# Patient Record
Sex: Female | Born: 1945 | Race: White | Hispanic: No | Smoking: Former smoker
Health system: Southern US, Community
[De-identification: ages and names within clinical notes are randomized; demographics above are authoritative.]

## PROBLEM LIST (undated history)

## (undated) DIAGNOSIS — I10 Essential (primary) hypertension: Secondary | ICD-10-CM

## (undated) DIAGNOSIS — F32A Depression, unspecified: Secondary | ICD-10-CM

## (undated) DIAGNOSIS — R053 Chronic cough: Secondary | ICD-10-CM

## (undated) DIAGNOSIS — E785 Hyperlipidemia, unspecified: Secondary | ICD-10-CM

## (undated) DIAGNOSIS — G629 Polyneuropathy, unspecified: Secondary | ICD-10-CM

## (undated) DIAGNOSIS — M810 Age-related osteoporosis without current pathological fracture: Secondary | ICD-10-CM

## (undated) DIAGNOSIS — N183 Chronic kidney disease, stage 3 unspecified: Secondary | ICD-10-CM

## (undated) DIAGNOSIS — I509 Heart failure, unspecified: Secondary | ICD-10-CM

## (undated) DIAGNOSIS — G8929 Other chronic pain: Secondary | ICD-10-CM

## (undated) DIAGNOSIS — F419 Anxiety disorder, unspecified: Secondary | ICD-10-CM

## (undated) DIAGNOSIS — M539 Dorsopathy, unspecified: Secondary | ICD-10-CM

## (undated) DIAGNOSIS — E119 Type 2 diabetes mellitus without complications: Secondary | ICD-10-CM

## (undated) DIAGNOSIS — K219 Gastro-esophageal reflux disease without esophagitis: Secondary | ICD-10-CM

## (undated) HISTORY — PX: ABDOMINAL HYSTERECTOMY: SHX81

## (undated) HISTORY — PX: BACK SURGERY: SHX140

## (undated) HISTORY — PX: ROTATOR CUFF REPAIR: SHX139

---

## 2000-11-18 ENCOUNTER — Encounter: Admission: RE | Admit: 2000-11-18 | Discharge: 2000-11-18 | Payer: Self-pay | Admitting: Family Medicine

## 2000-11-29 ENCOUNTER — Ambulatory Visit (HOSPITAL_BASED_OUTPATIENT_CLINIC_OR_DEPARTMENT_OTHER): Admission: RE | Admit: 2000-11-29 | Discharge: 2000-11-30 | Payer: Self-pay | Admitting: Orthopedic Surgery

## 2004-09-17 ENCOUNTER — Emergency Department (HOSPITAL_COMMUNITY): Admission: EM | Admit: 2004-09-17 | Discharge: 2004-09-18 | Payer: Self-pay | Admitting: Emergency Medicine

## 2004-09-17 ENCOUNTER — Emergency Department: Payer: Self-pay | Admitting: Internal Medicine

## 2004-09-17 IMAGING — CR LEFT WRIST - COMPLETE 3+ VIEW
1 series · 4 of 4 positions shown · non-contrast
Comparison: none

REASON FOR EXAM: Injury, pain
COMMENTS:

[Series 1: view not recorded · 0.17mm/px · 4 of 4 slices shown]
[im 1/4]
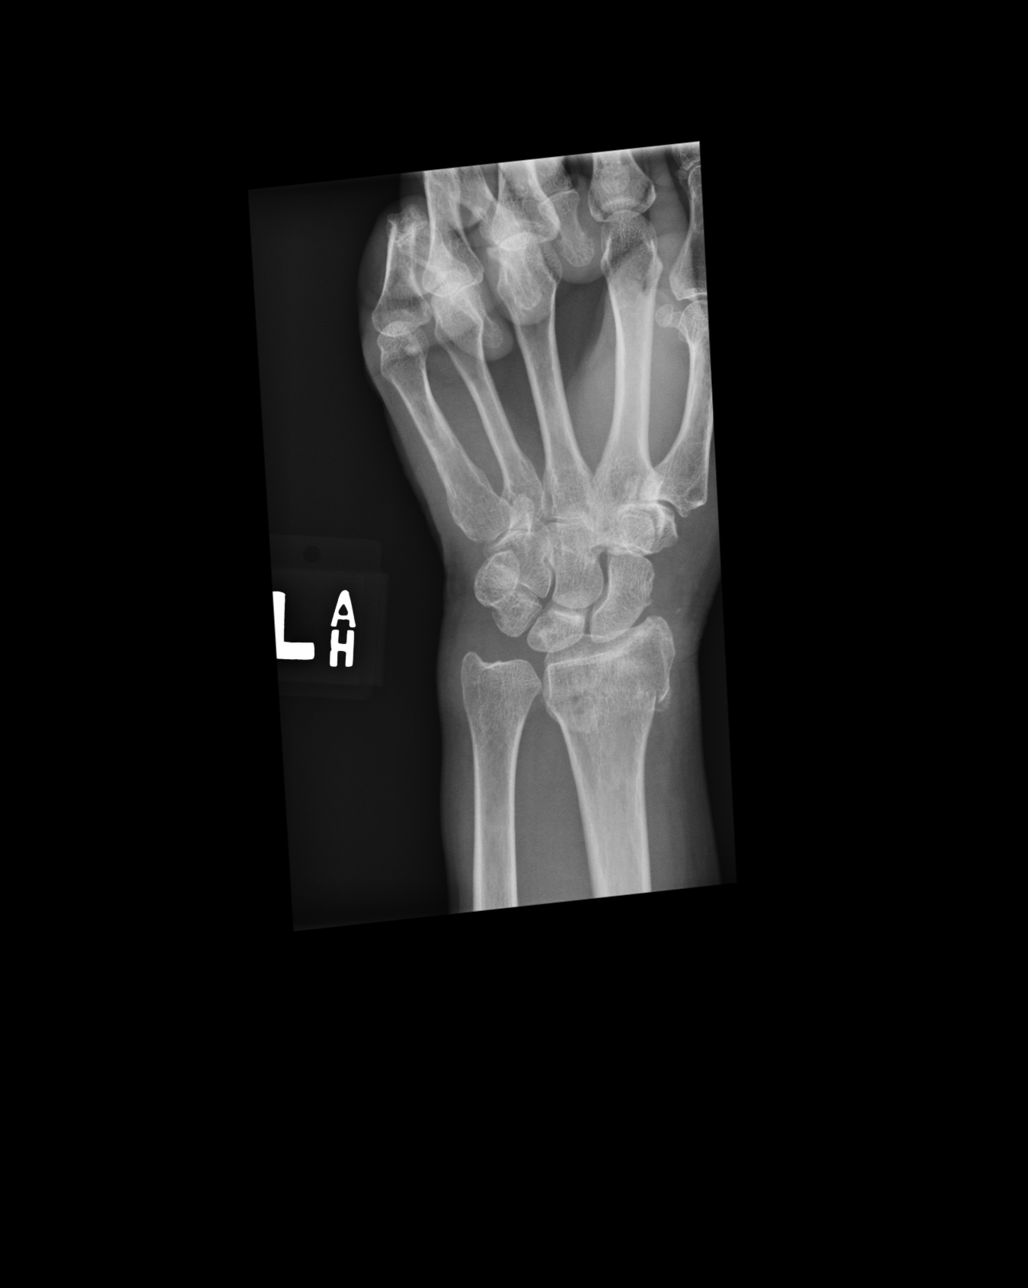
[im 2/4]
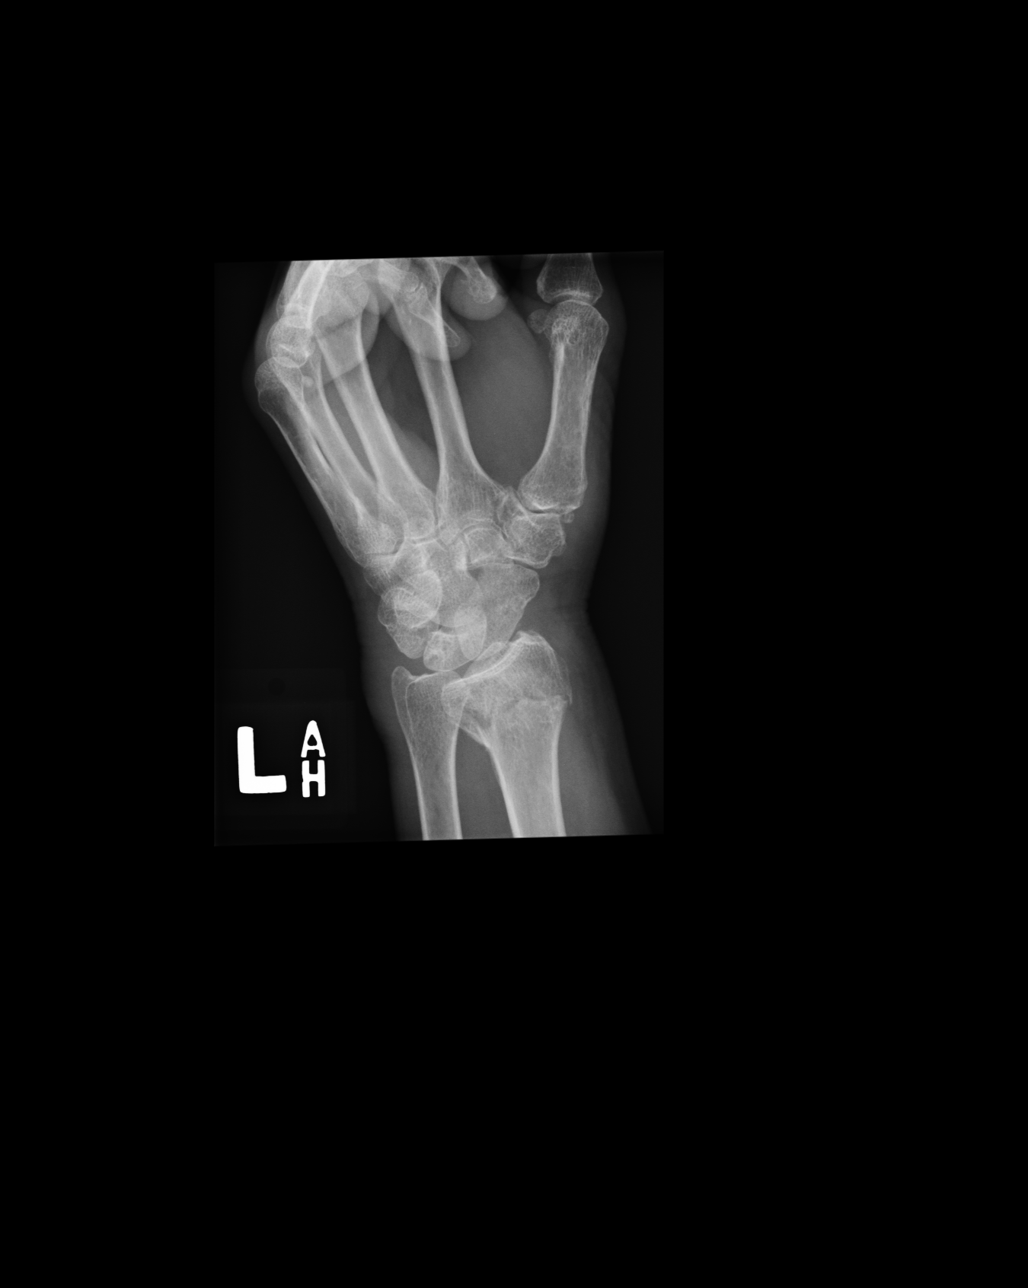
[im 3/4]
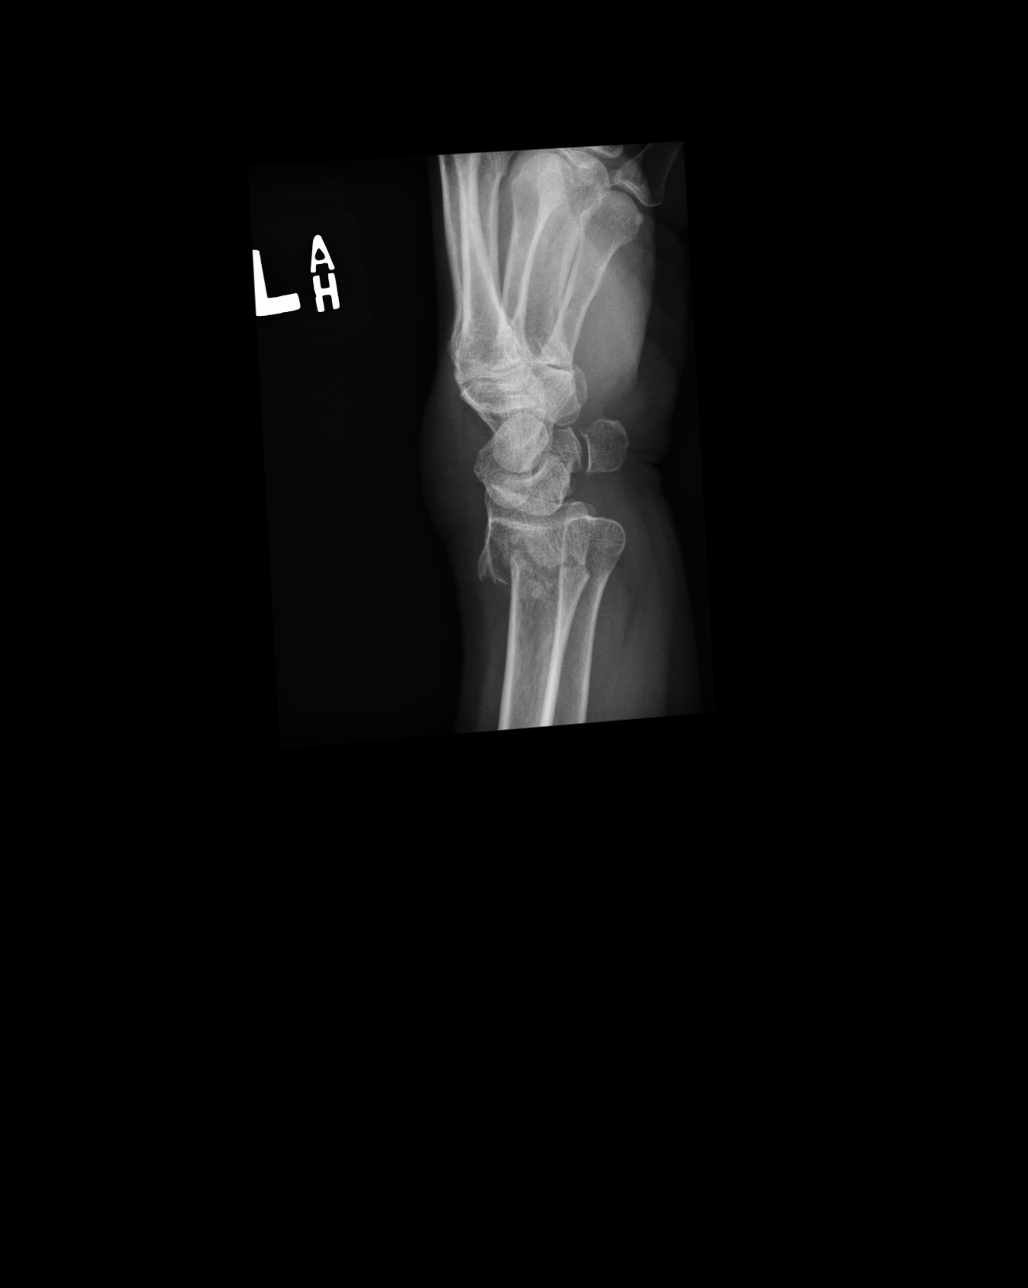
[im 4/4]
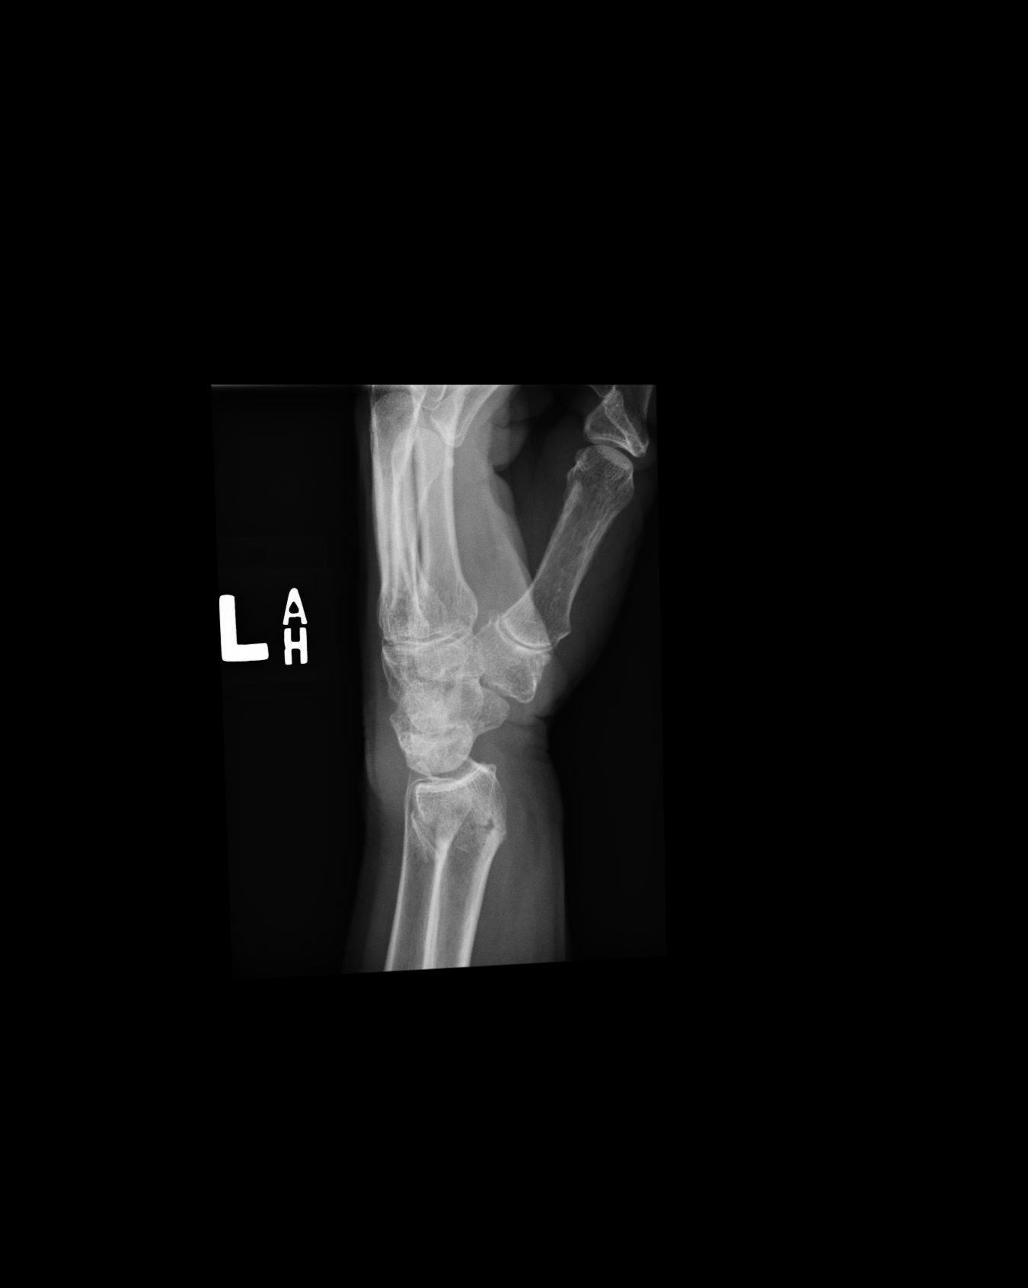

[4 of 4 positions shown; findings below may reference images not displayed]

PROCEDURE:     DXR - DXR WRIST LT COMP WITH OBLIQUES  - [DATE]  [DATE]

RESULT:     Multiple views of the LEFT wrist show a fracture in the distal
metaphysis of the LEFT radius with overlap of the fracture fragments by
to 4.0 mm. There remains anatomic alignment between the distal epiphysis of
the radius and the carpal bones. There is extensive soft tissue swelling. I
do not see evidence of an accompanying fracture of the ulna. Arthritic
changes are noted in the carpal bones particularly at the base of the thumb.
IMPRESSION: 1.     Comminuted fracture of the distal LEFT radius with 3.0 to 4.0 mm of
overlap.
2.     No perilunate disassociation.
3.     Extensive soft tissue swelling.
4.     No evidence of accompanying ulnar fracture.
5.     Arthritic changes within the carpal bones.

## 2004-09-18 IMAGING — CR DG FOREARM 2V*L*
2 series · 2 of 2 positions shown · non-contrast
Comparison: none

CLINICAL DATA: Status post closed reduction of distal radius fracture.
 LEFT FOREARM ? 2 VIEW:
 A transverse fracture of the distal radial metaphysis is seen which is in near anatomic alignment.  A fiberglass cast has been applied.  The cast obscures fine bone detail however, no other fracture is identified.

[view not recorded (1 of 2)]
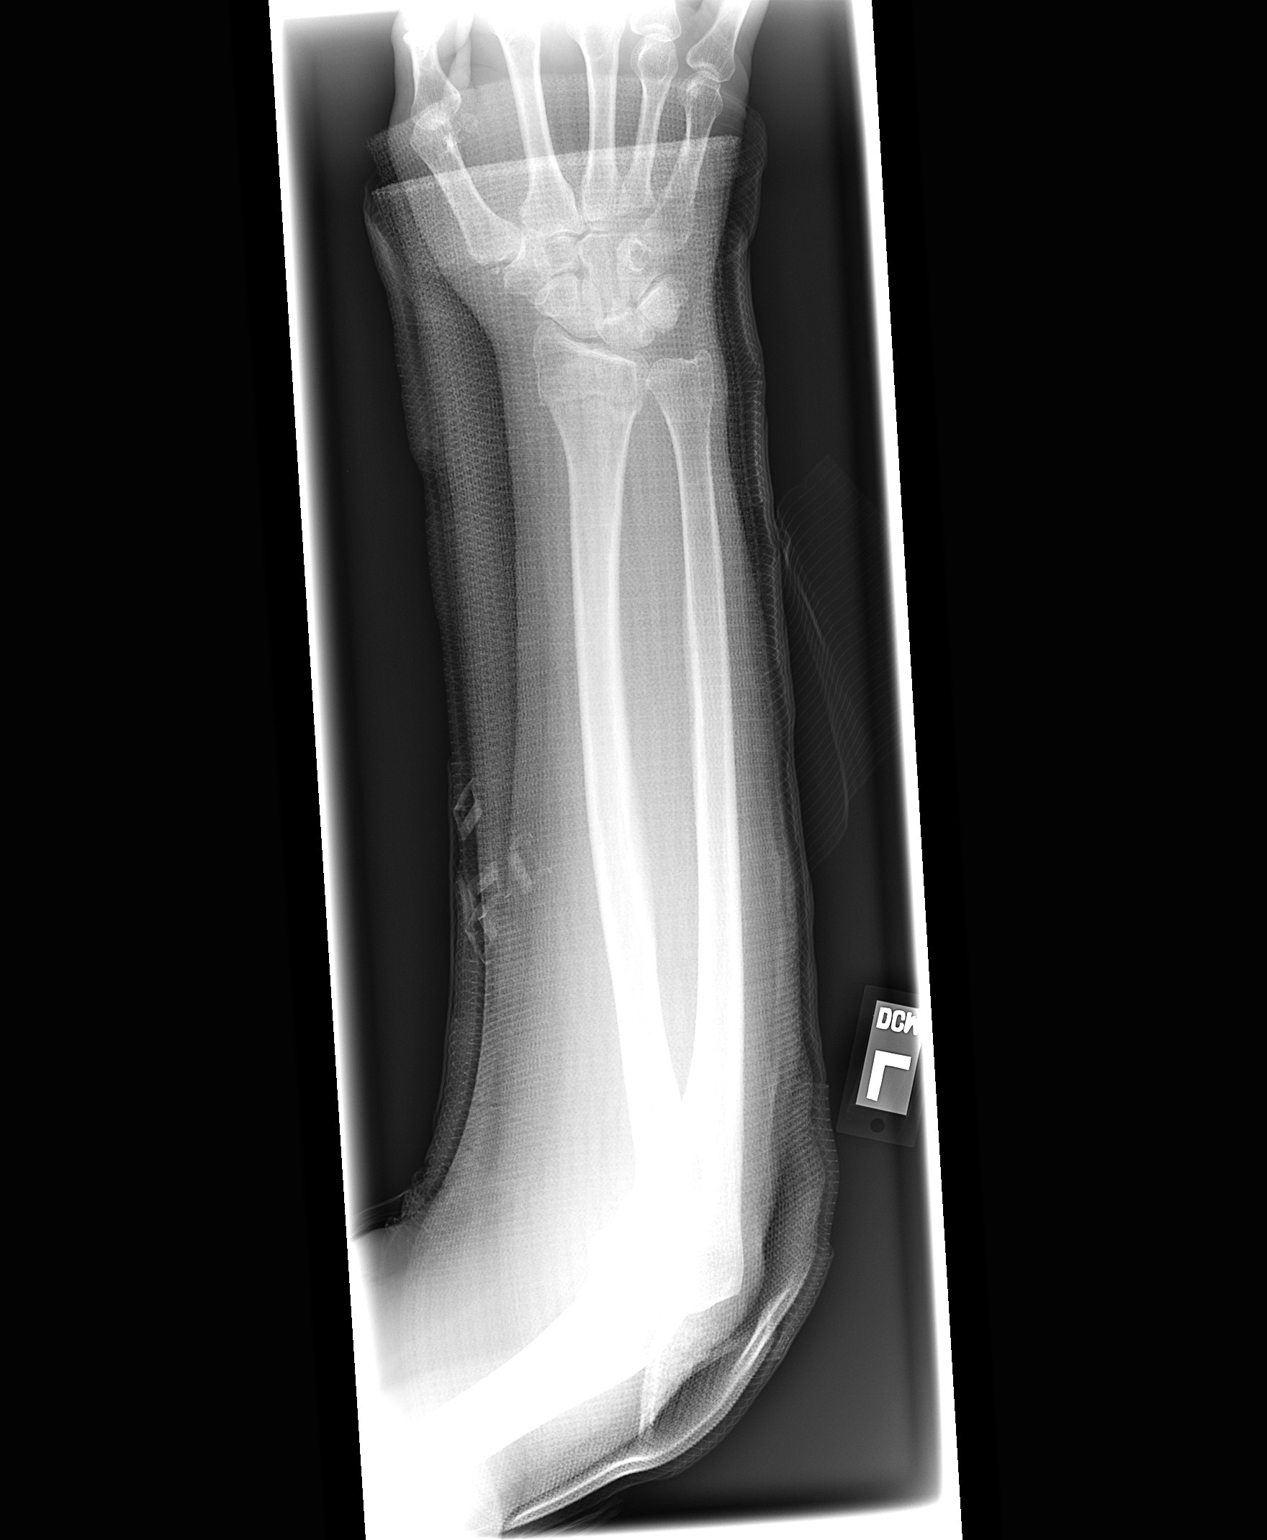

[view not recorded (2 of 2)]
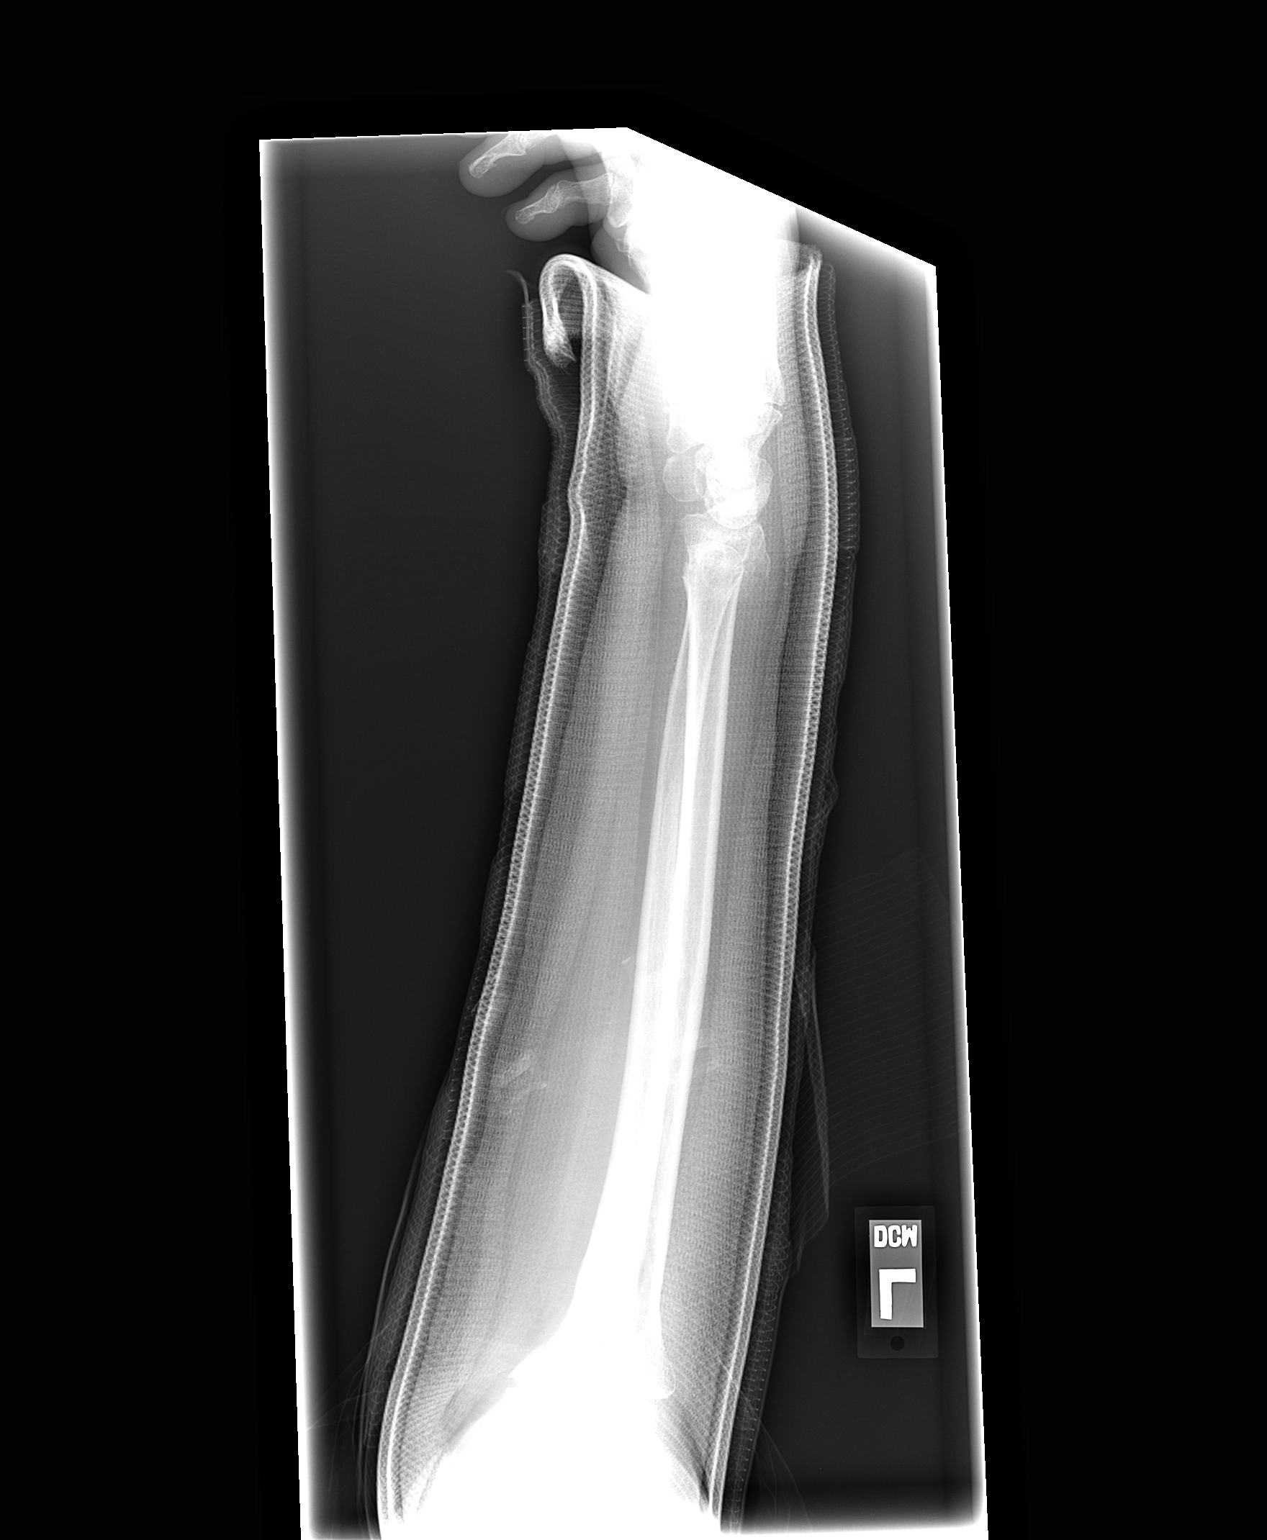

[2 of 2 positions shown; findings below may reference images not displayed]

IMPRESSION: Transverse fracture of the distal radial metaphysis, now in near anatomic alignment.

## 2005-02-28 ENCOUNTER — Ambulatory Visit: Payer: Self-pay | Admitting: Specialist

## 2005-03-21 DIAGNOSIS — I219 Acute myocardial infarction, unspecified: Secondary | ICD-10-CM

## 2005-03-21 HISTORY — DX: Acute myocardial infarction, unspecified: I21.9

## 2005-03-21 HISTORY — PX: CARDIAC CATHETERIZATION: SHX172

## 2005-07-08 ENCOUNTER — Other Ambulatory Visit: Payer: Self-pay

## 2005-07-08 ENCOUNTER — Emergency Department: Payer: Self-pay | Admitting: Emergency Medicine

## 2005-07-08 IMAGING — CR DG CHEST 1V PORT
1 series · 1 of 1 positions shown · non-contrast
Comparison: none

REASON FOR EXAM: CHEST PAIN
COMMENTS:

PROCEDURE:     DXR - DXR PORTABLE CHEST SINGLE VIEW  - [DATE] [DATE]
RESULT:     AP view of the chest shows the lung fields to be clear.  The
heart, mediastinal, and osseous structures show no significant
abnormalities. Monitoring electrodes are present.

[view not recorded]
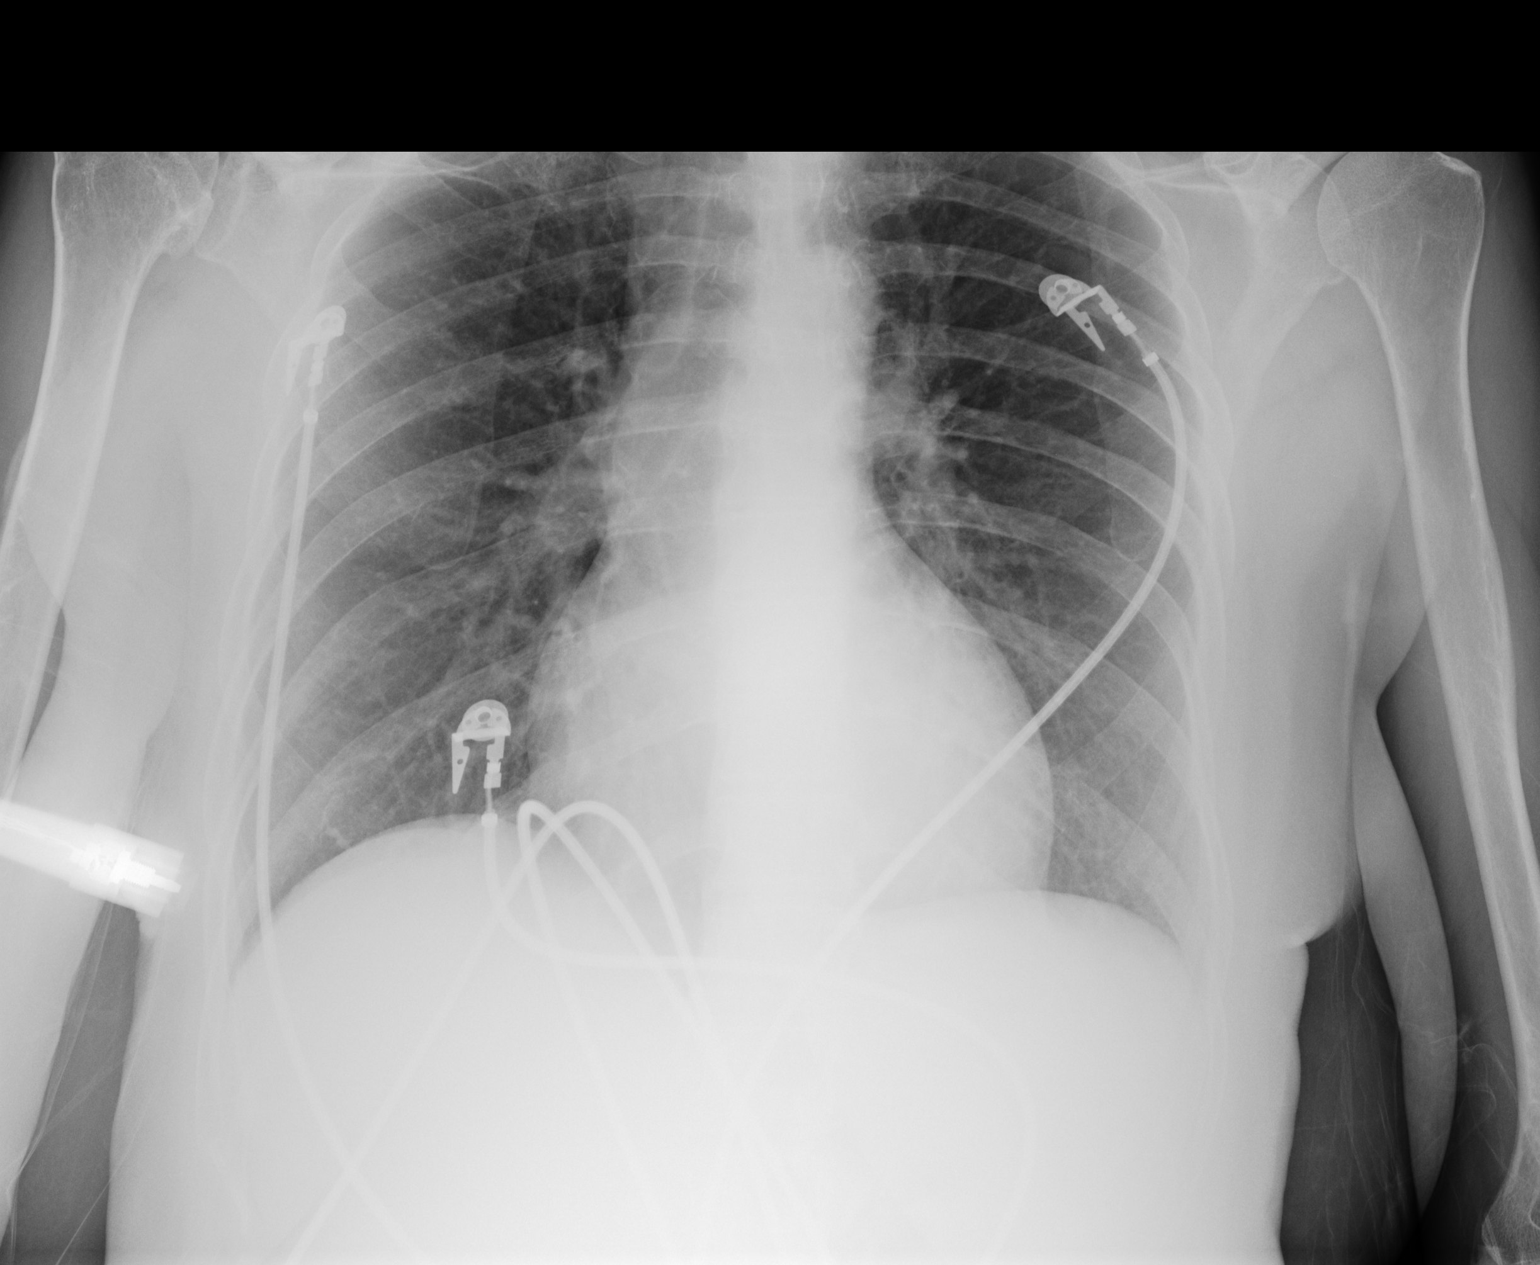

[1 of 1 positions shown; findings below may reference images not displayed]

IMPRESSION: No acute changes are identified.

## 2005-08-26 ENCOUNTER — Other Ambulatory Visit: Payer: Self-pay

## 2005-08-26 IMAGING — CR DG FOOT COMPLETE 3+V*L*
1 series · 3 of 3 positions shown · non-contrast
Comparison: none

REASON FOR EXAM: twisted this a.m., now with swelling
COMMENTS:  LMP: Post-Menopausal

[Series 1: view not recorded · 0.17mm/px · 3 of 3 slices shown]
[im 1/3]
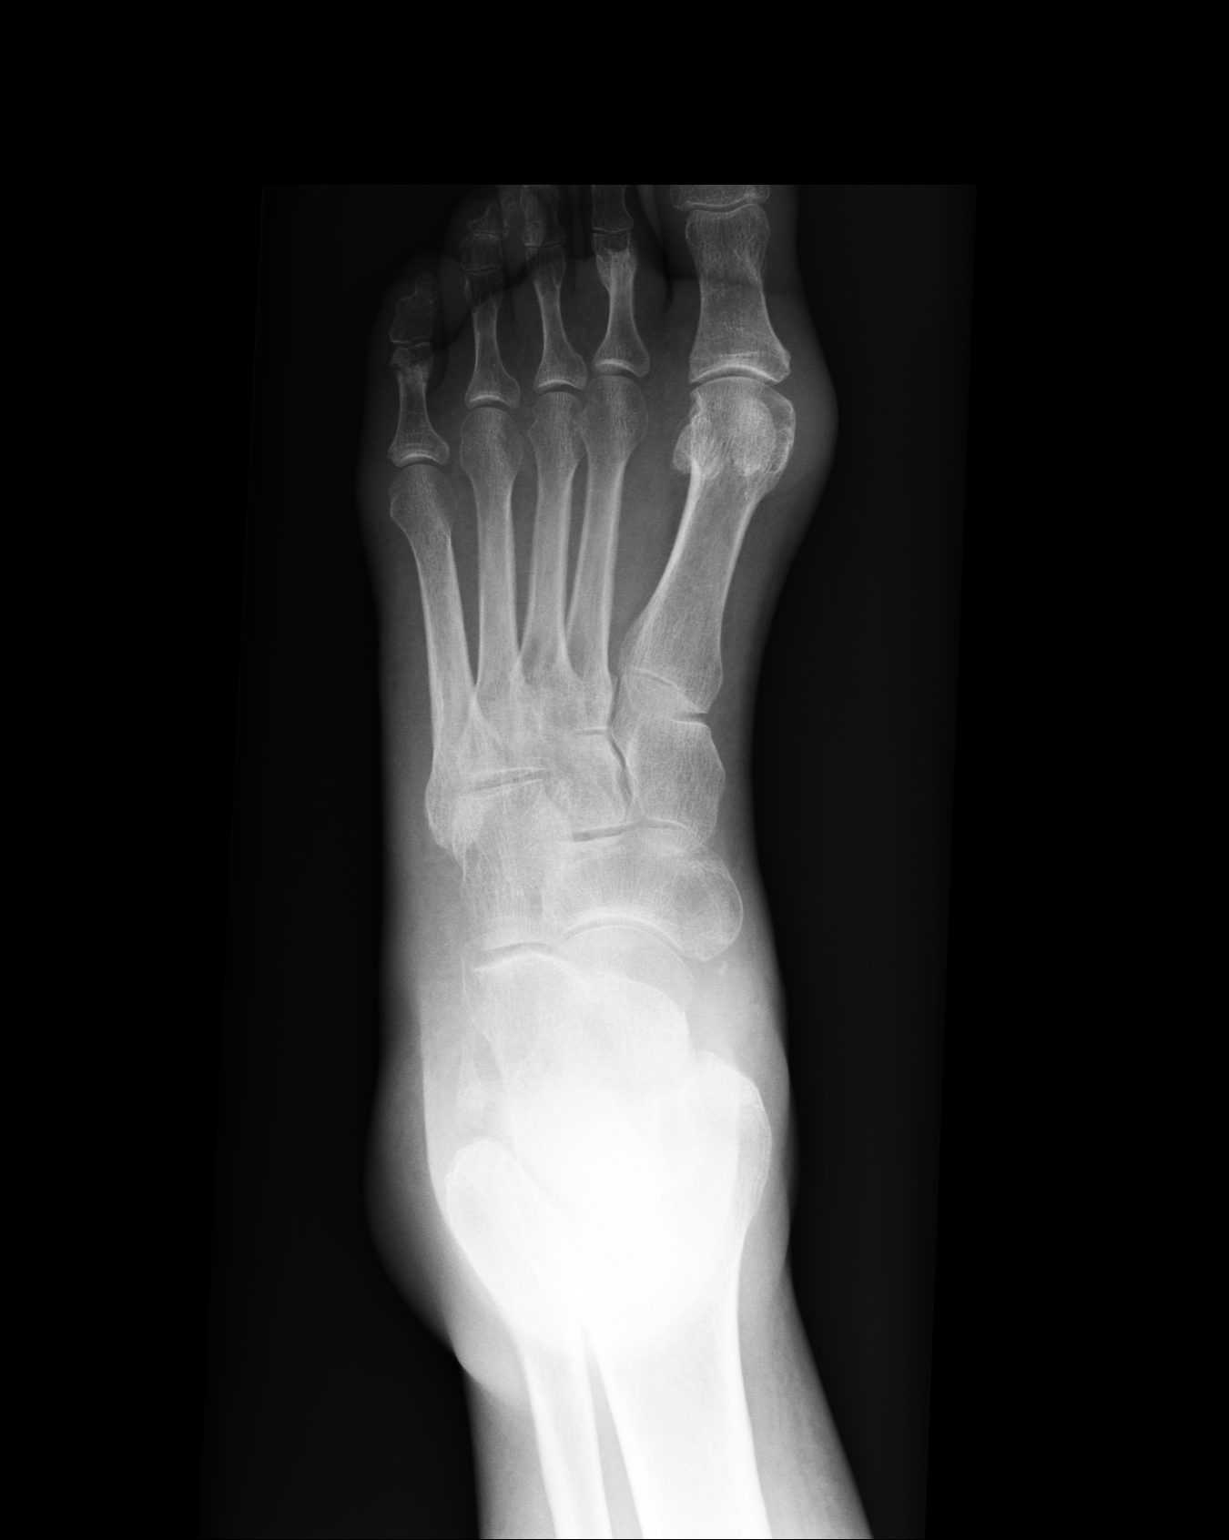
[im 2/3]
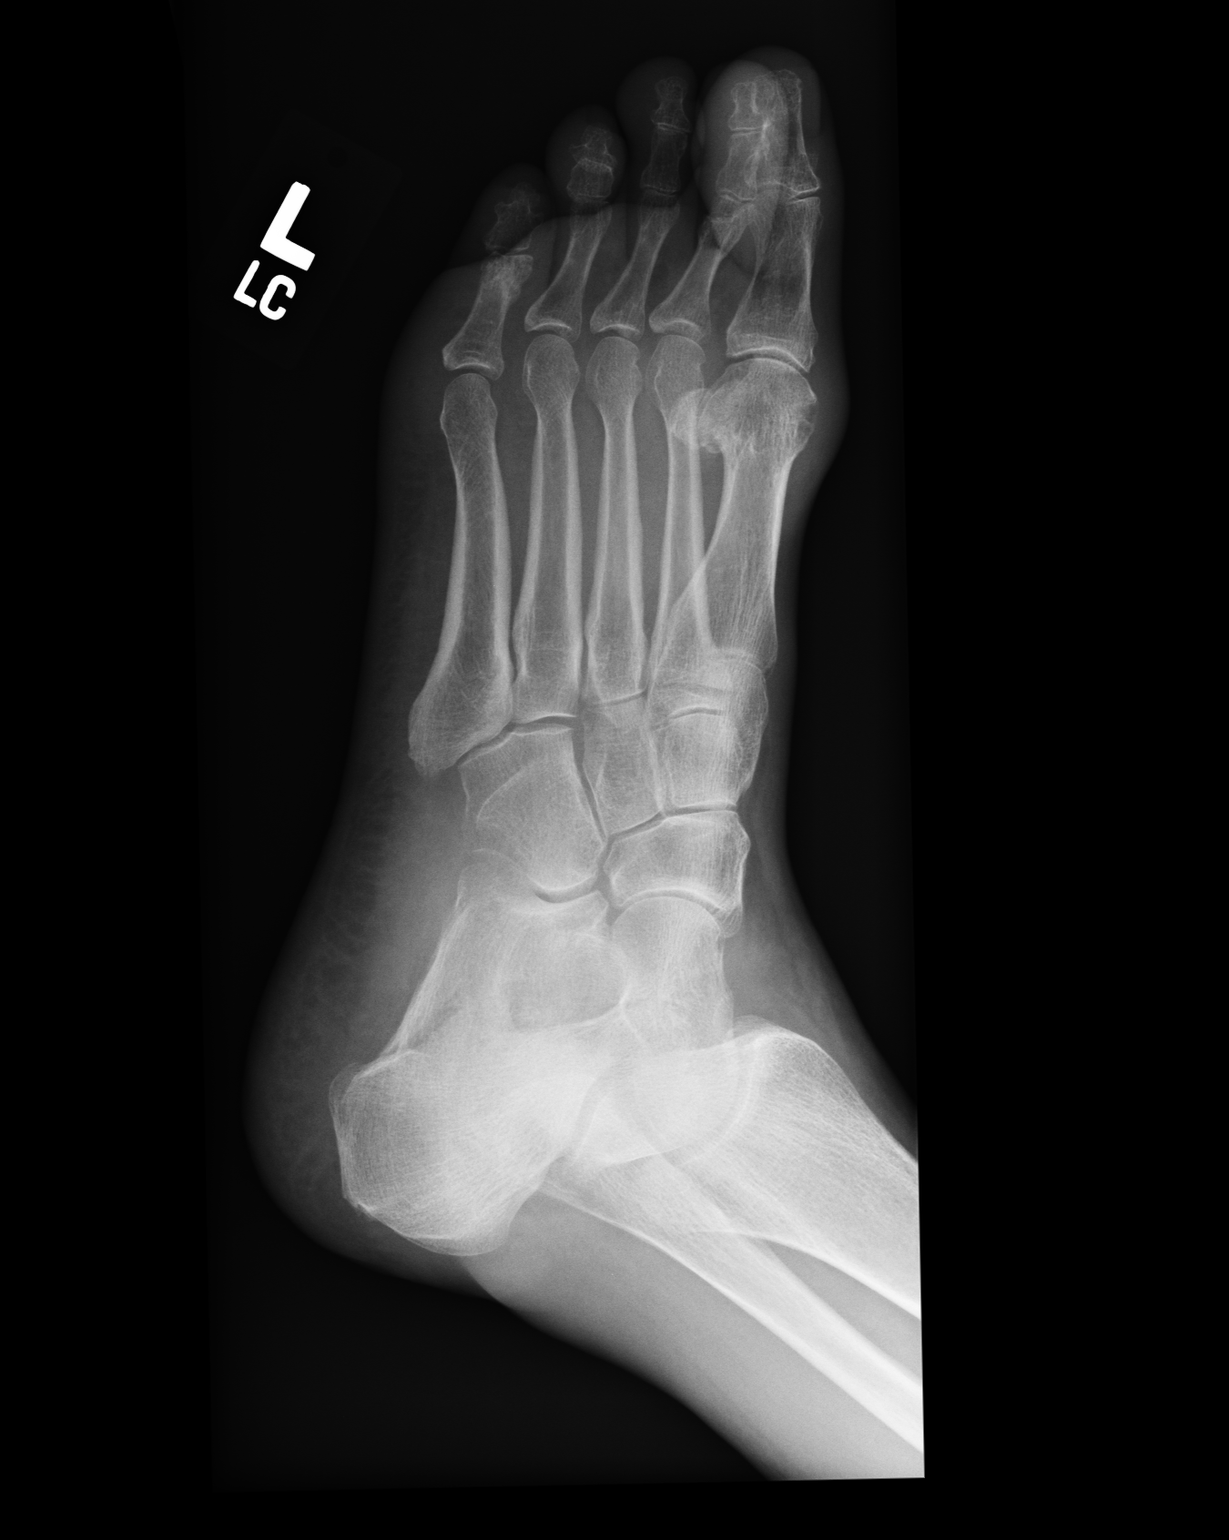
[im 3/3]
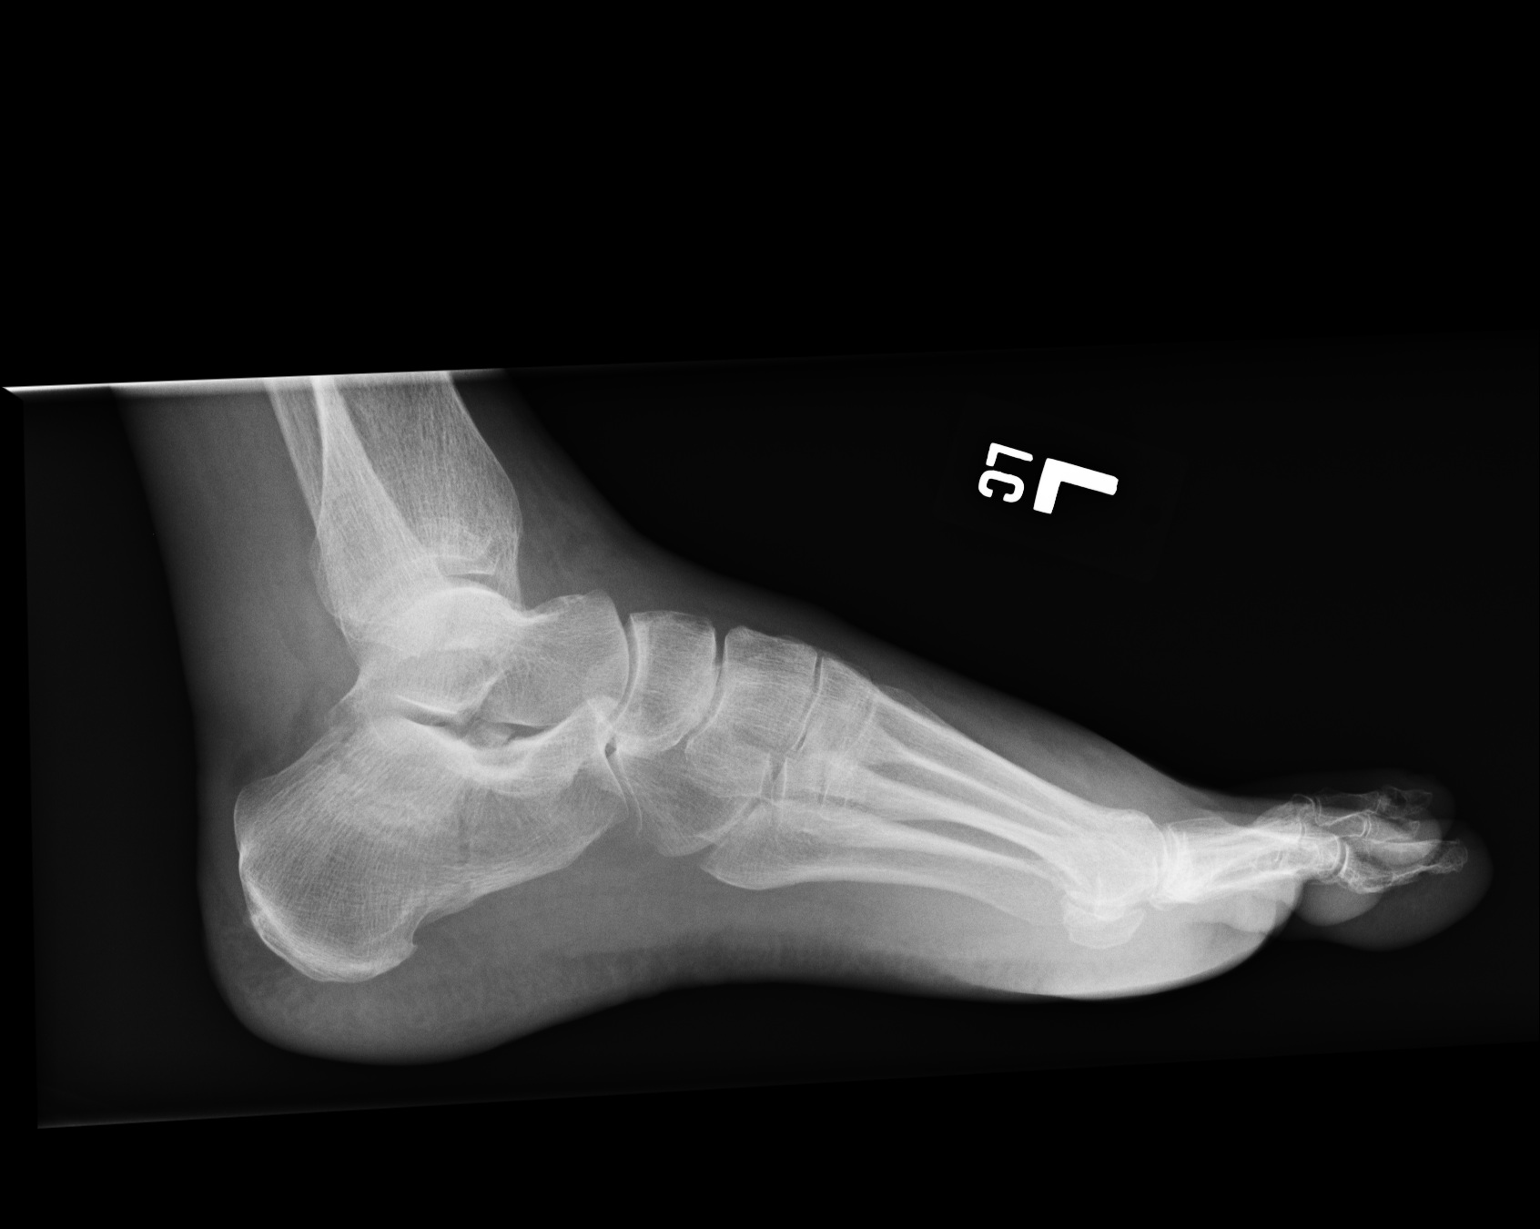

[3 of 3 positions shown; findings below may reference images not displayed]

PROCEDURE:     DXR - DXR FOOT LT COMP W/OBLIQUES  - [DATE]  [DATE]

RESULT:          The bones are osteopenic. A minimally displaced fracture is
appreciated along the distal aspect of the calcaneus extending from the
superior border through the central portion of the calcaneus.  This fracture
demonstrates minimal dorsal displacement.  There does appear to be an
associated effusion/hematoma.  No further fractures are identified.
Degenerative changes are demonstrated within the interphalangeal joints.
IMPRESSION: Calcaneal fracture as described above.

## 2005-08-26 IMAGING — CR DG CHEST 1V PORT
1 series · 1 of 1 positions shown · non-contrast
Comparison: none

REASON FOR EXAM: chest pain
COMMENTS:

[view not recorded]
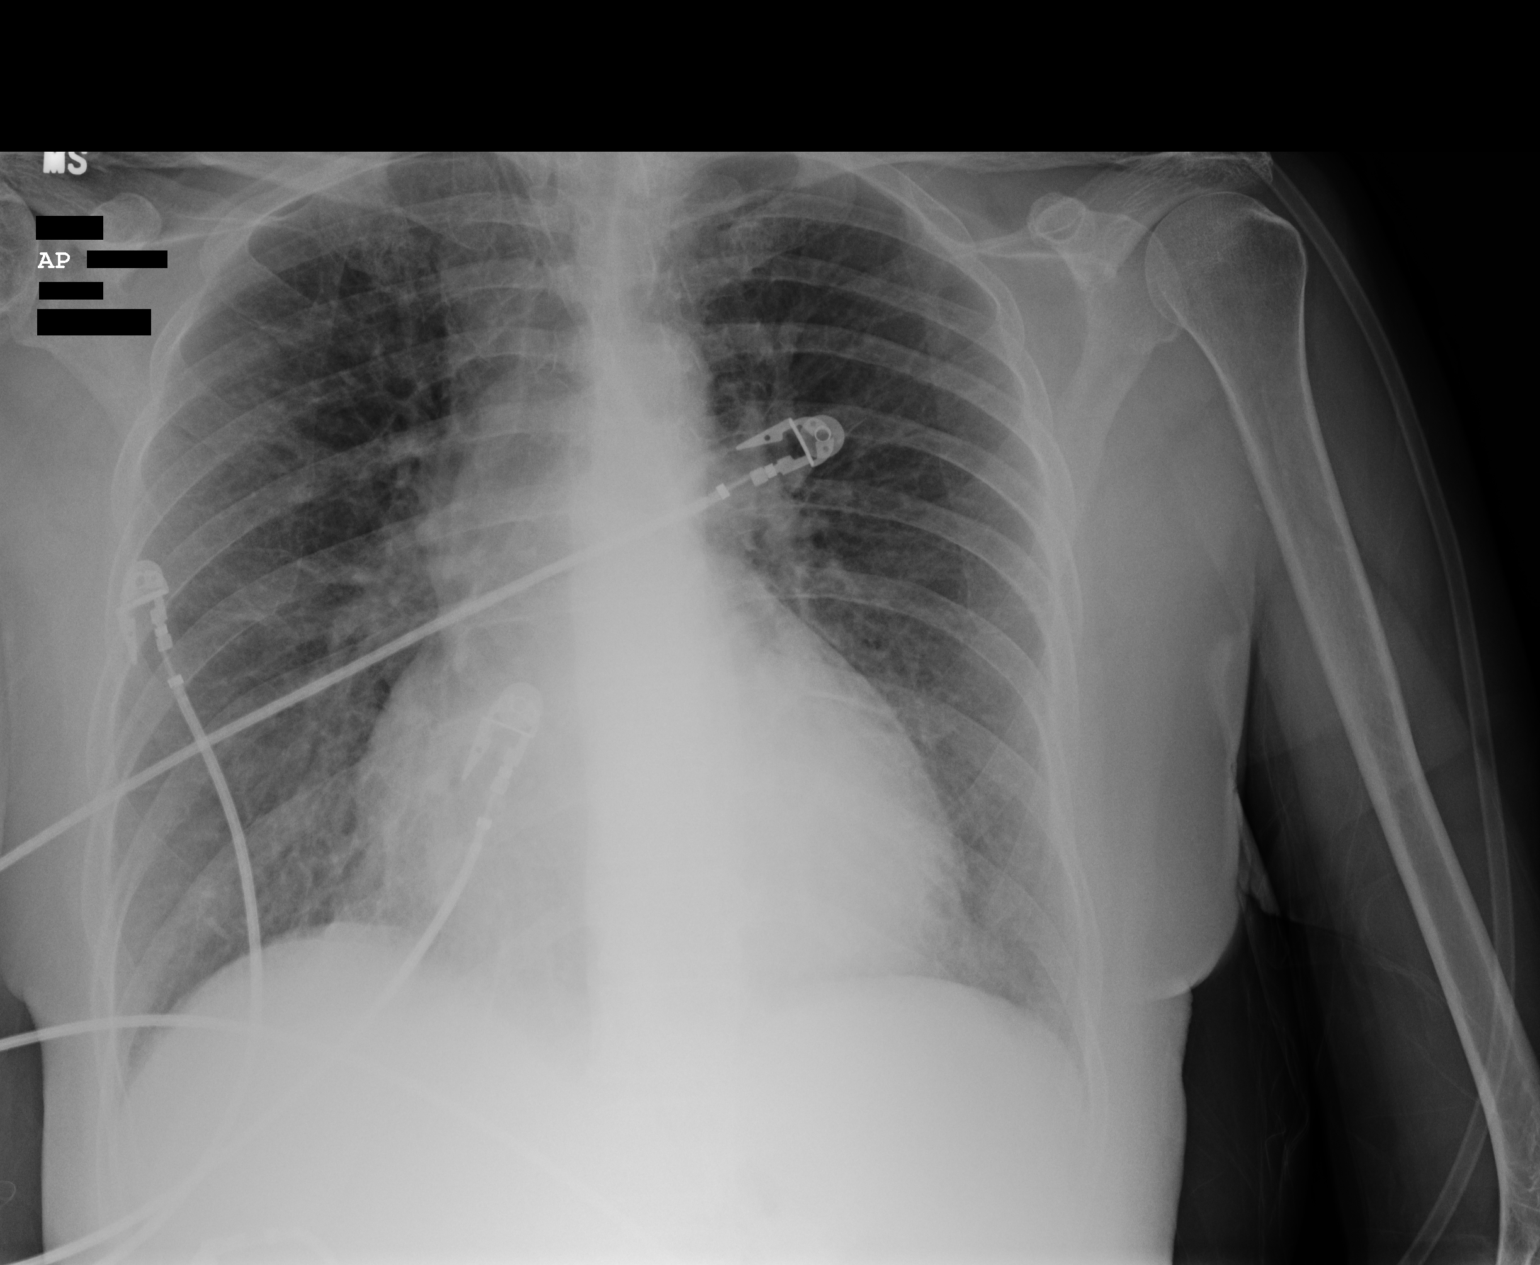

[1 of 1 positions shown; findings below may reference images not displayed]

PROCEDURE:     DXR - DXR PORTABLE CHEST SINGLE VIEW  - [DATE]  [DATE]

RESULT:          Comparison is made to a prior study dated [DATE].

There is thickening of the interstitial markings as well as an element of
peribronchial cuffing.  No focal regions of consolidation are demonstrated.
The cardiac silhouette is enlarged indicative of cardiomegaly.  The
visualized bony skeleton is osteopenic.
IMPRESSION: Findings consistent with an element of pulmonary edema
as well as possibly an element of pulmonary fibrosis.  No focal regions of
consolidation are identified.

## 2005-08-26 IMAGING — CR DG ANKLE COMPLETE 3+V*L*
1 series · 5 of 5 positions shown · non-contrast
Comparison: none

REASON FOR EXAM: injury
COMMENTS:

[Series 1: view not recorded · 0.17mm/px · 5 of 5 slices shown]
[im 1/5]
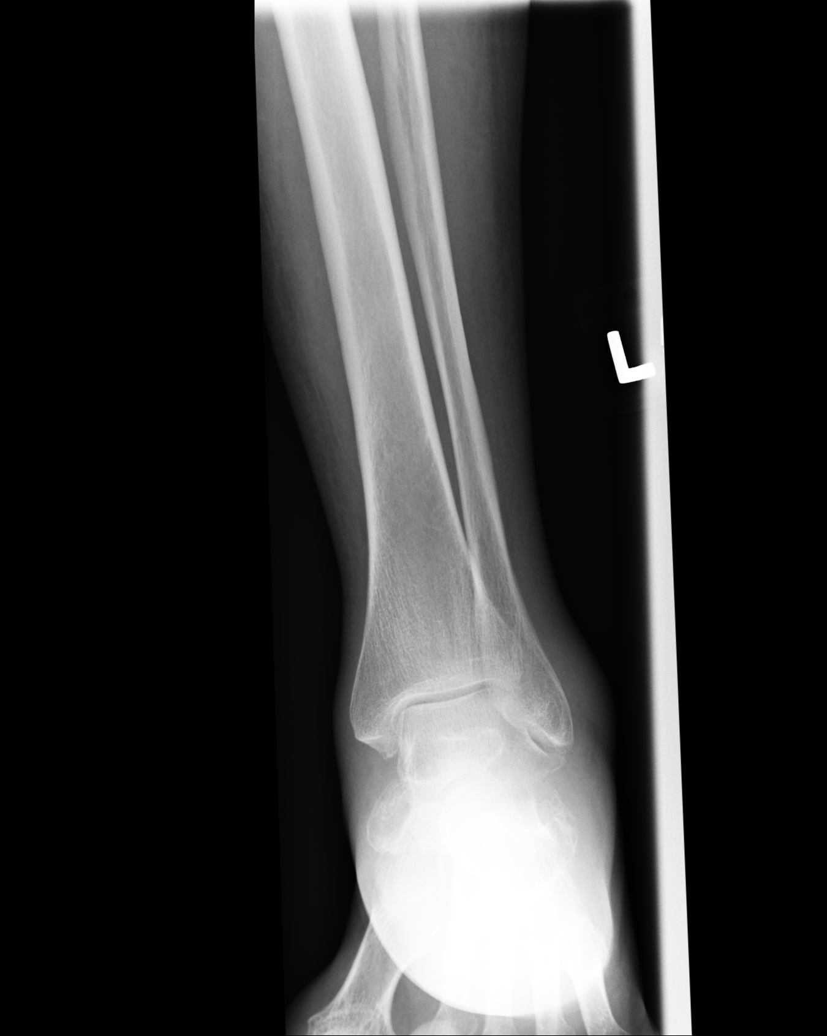
[im 2/5]
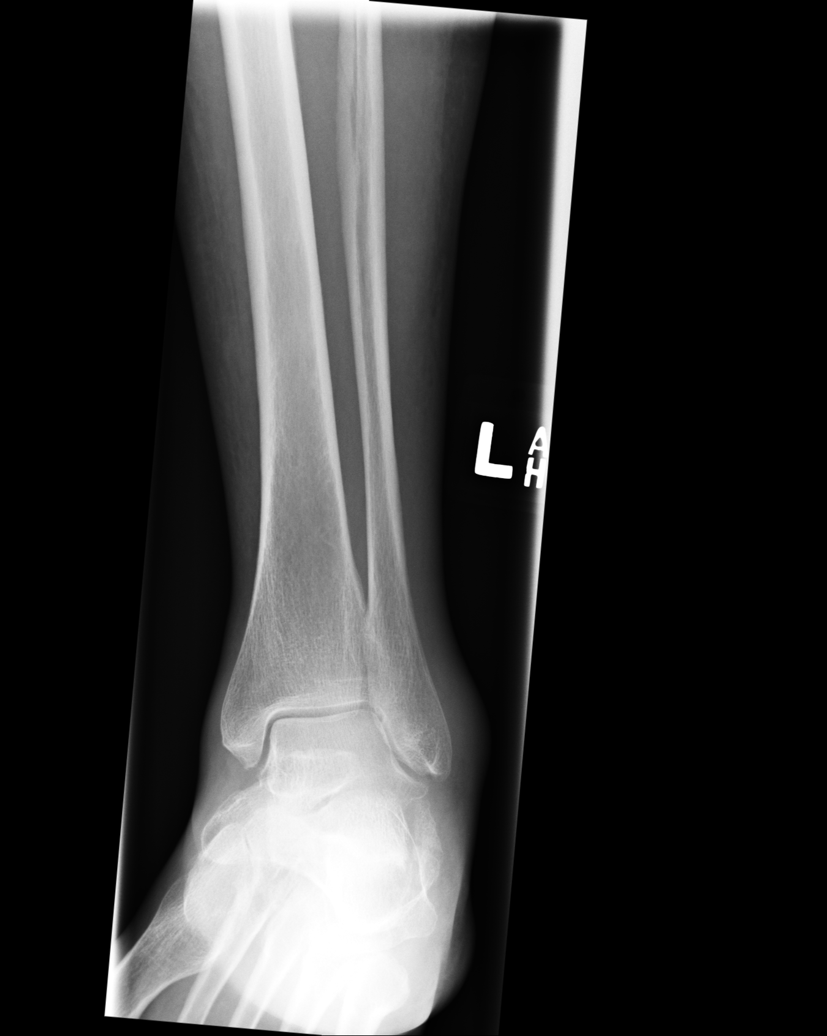
[im 3/5]
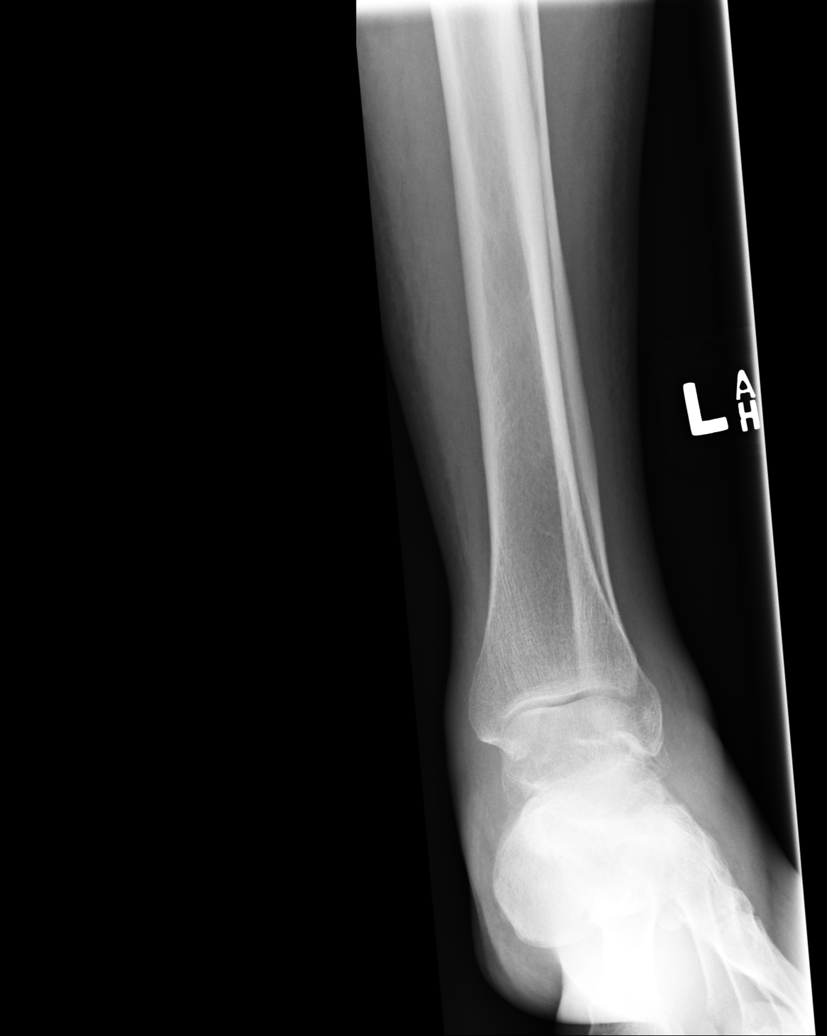
[im 4/5]
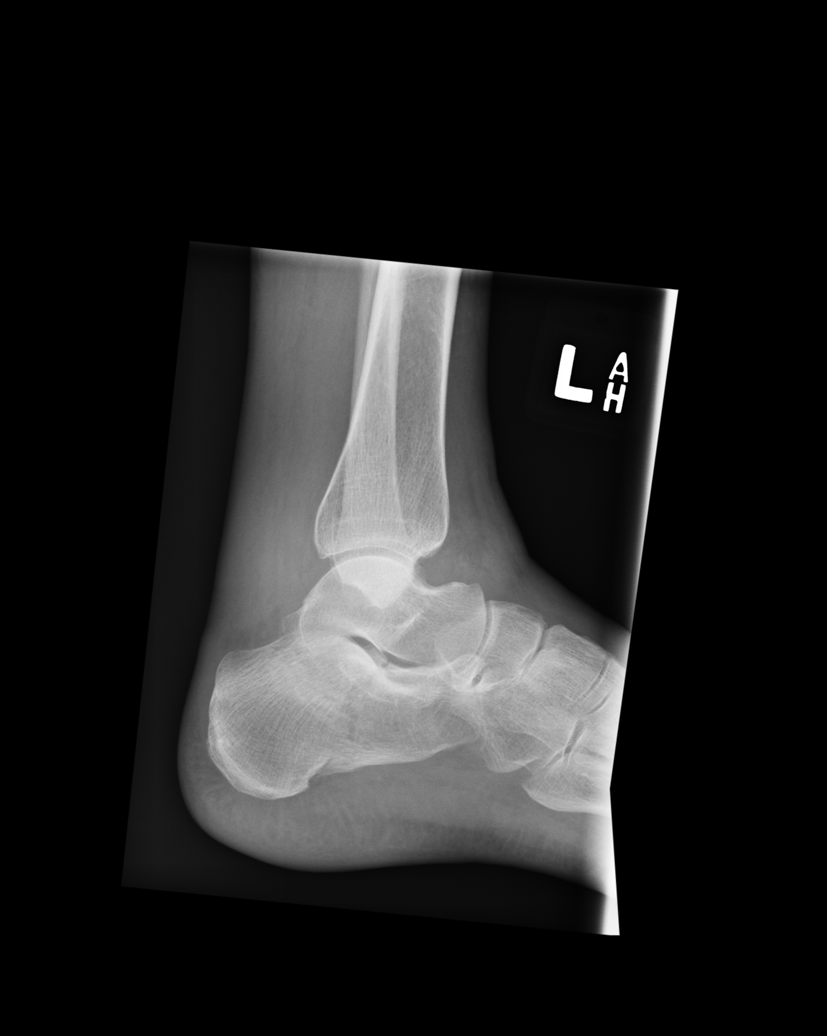
[im 5/5]
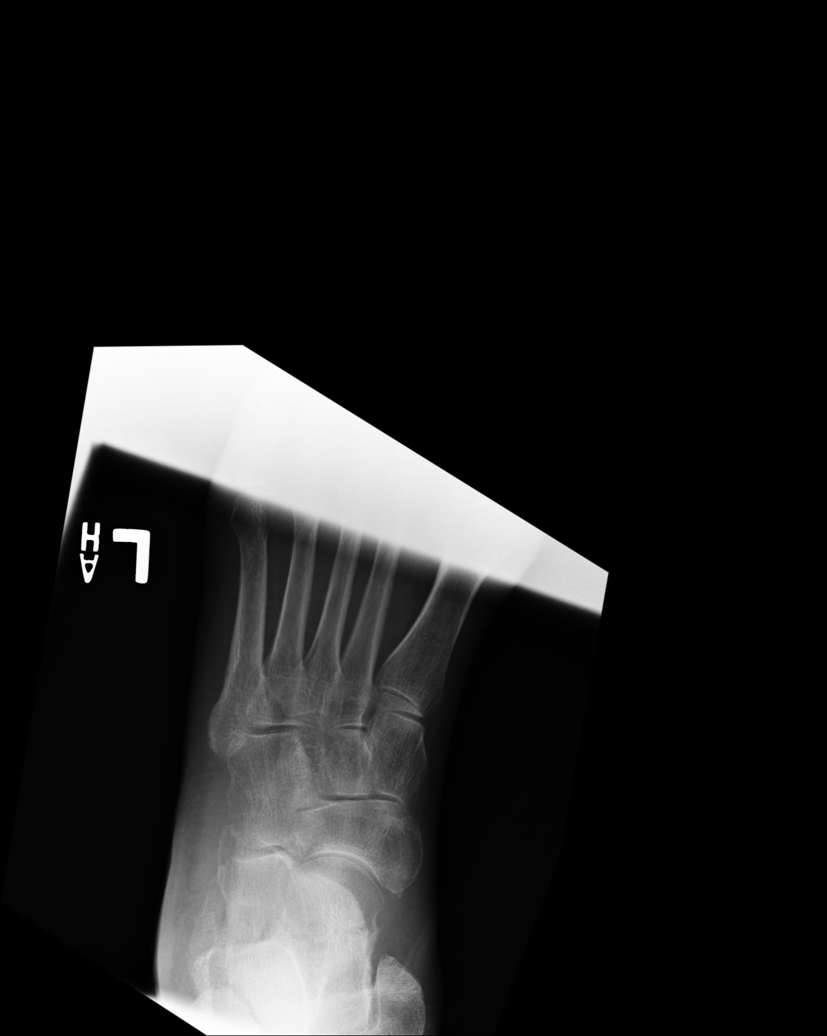

[5 of 5 positions shown; findings below may reference images not displayed]

PROCEDURE:     DXR - DXR ANKLE LEFT COMPLETE  - [DATE]  [DATE]

RESULT:          The bones are diffusely osteopenic.  A minimally displaced
calcaneus fracture is once again appreciated.  Soft tissue swelling is
identified within the lateral malleolar region.  No further fracture or
dislocation is identified.
IMPRESSION: 1.     Diffuse osteopenia.
2.     Findings possibly representing an element of ligamentous injury in
the lateral malleolar region.
3.     Nondisplaced calcaneal fracture.

## 2005-08-27 ENCOUNTER — Inpatient Hospital Stay: Payer: Self-pay | Admitting: Internal Medicine

## 2005-10-27 ENCOUNTER — Ambulatory Visit: Payer: Self-pay | Admitting: Cardiovascular Disease

## 2009-12-18 ENCOUNTER — Emergency Department: Payer: Self-pay | Admitting: Emergency Medicine

## 2009-12-19 IMAGING — CT CT CERVICAL SPINE WITHOUT CONTRAST
3 series · 16 of 33 positions shown, 19 images · non-contrast
Comparison: none

REASON FOR EXAM: neck pain
COMMENTS:

[Series 3: axial · axial · 0.33mm/px · z∈[-272,-126]mm · 8 of 90 slices shown, 10 images]
[im 7/90  soft-tissue]
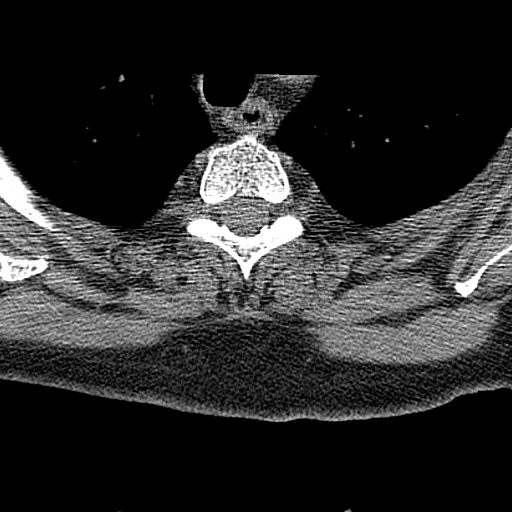
[im 7/90  bone]
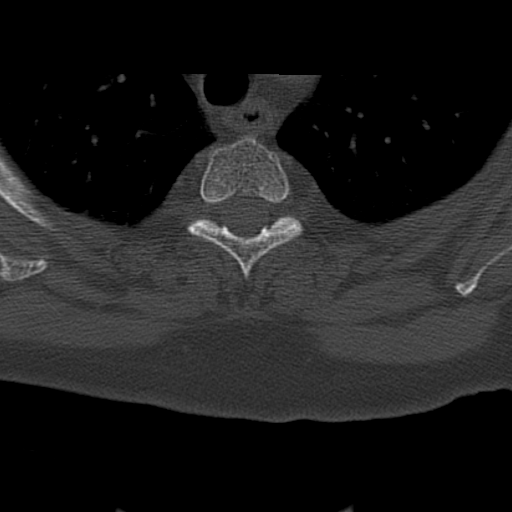
[im 21/90  bone]
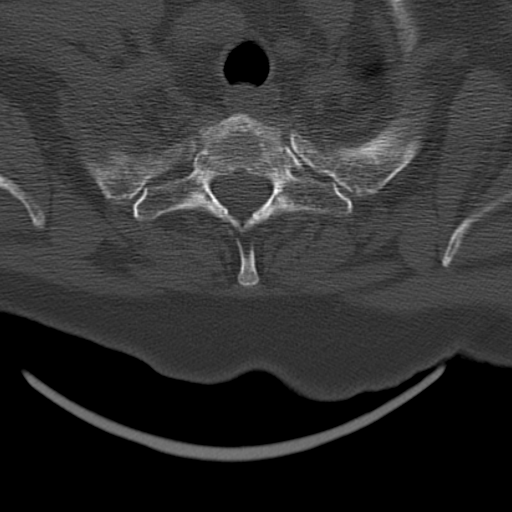
[im 28/90  bone]
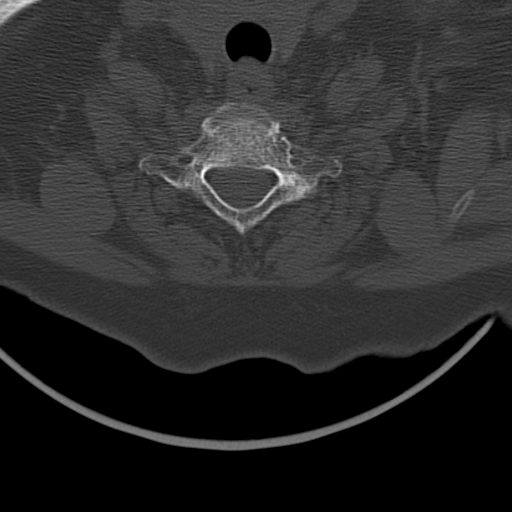
[im 42/90  bone]
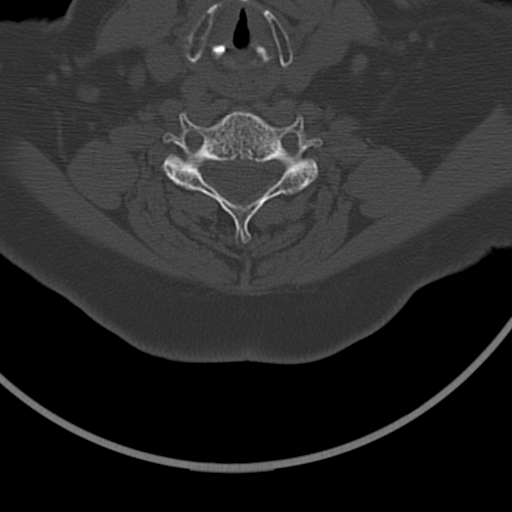
[im 48/90  soft-tissue]
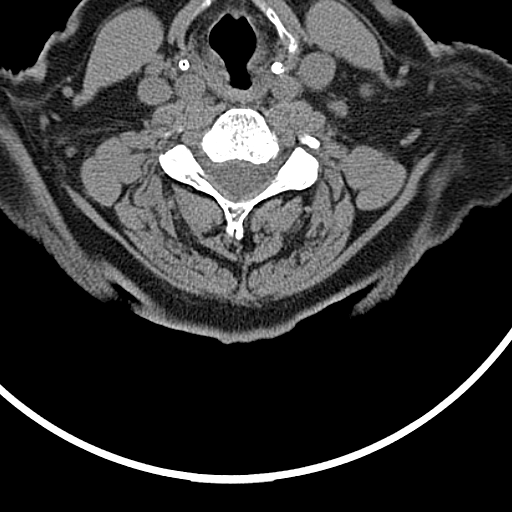
[im 48/90  bone]
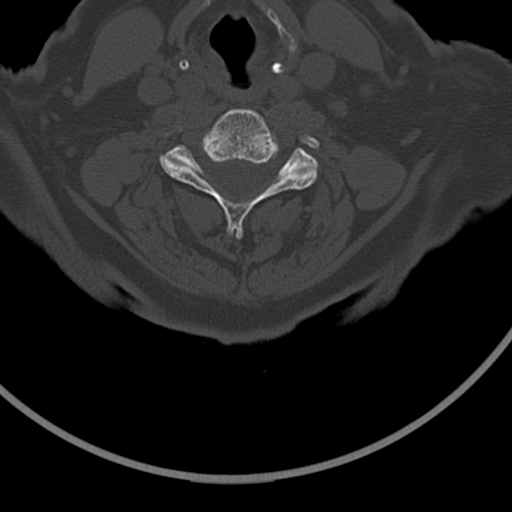
[im 62/90  bone]
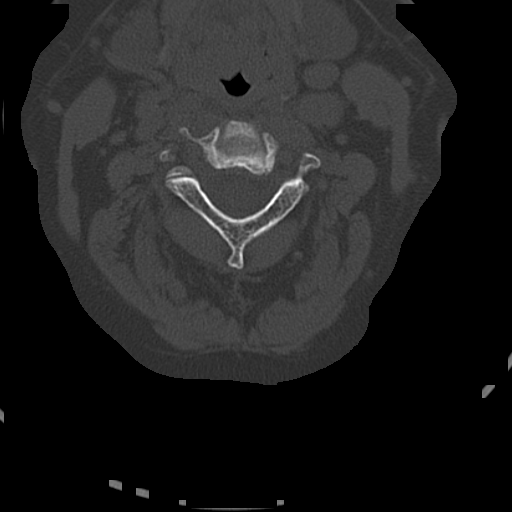
[im 69/90  bone]
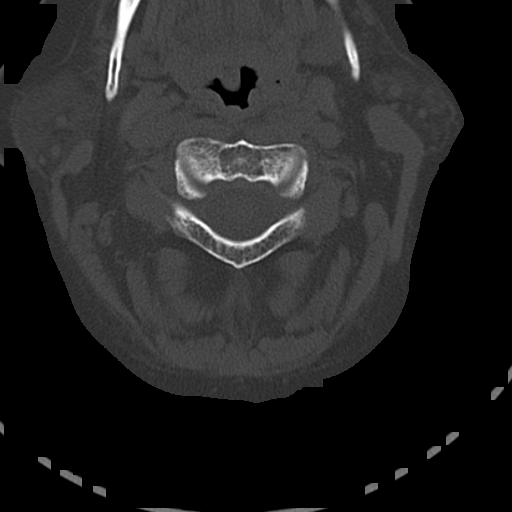
[im 83/90  bone]
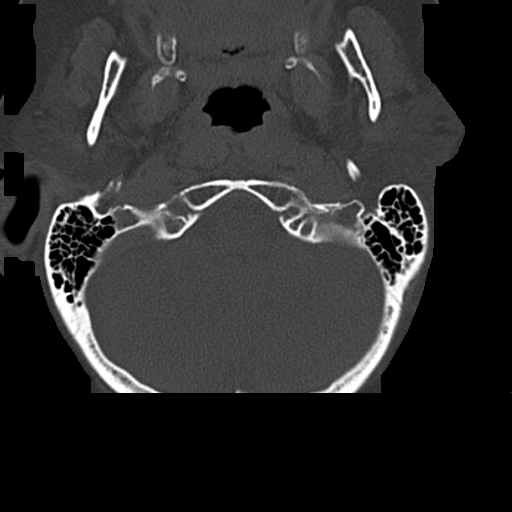

[Series 4: sagittal · sagittal · 0.42mm/px · 5 of 43 slices shown, 6 images]
[im 15/43  bone]
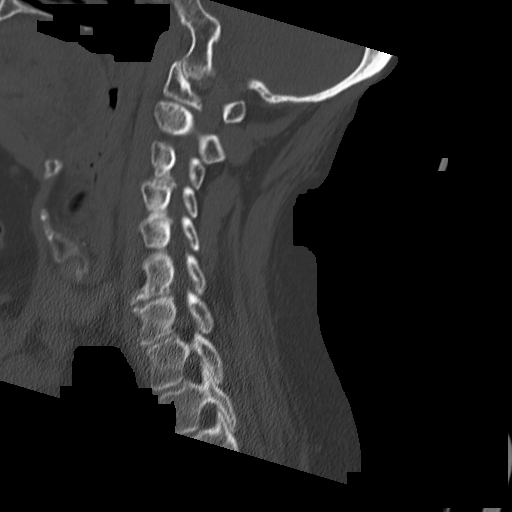
[im 18/43  bone]
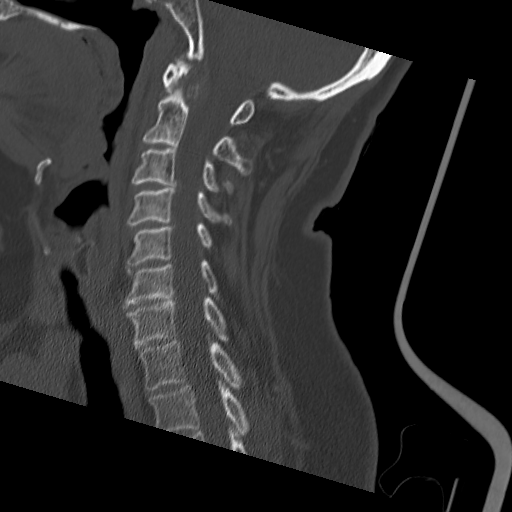
[im 22/43  soft-tissue]
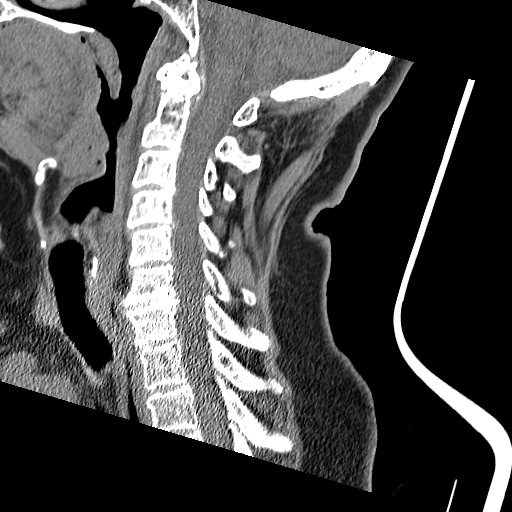
[im 22/43  bone]
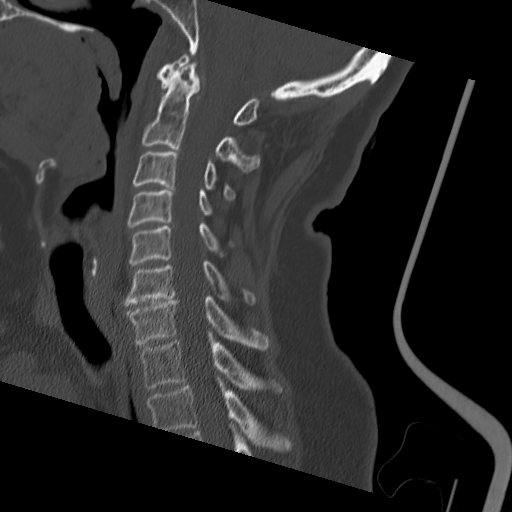
[im 25/43  bone]
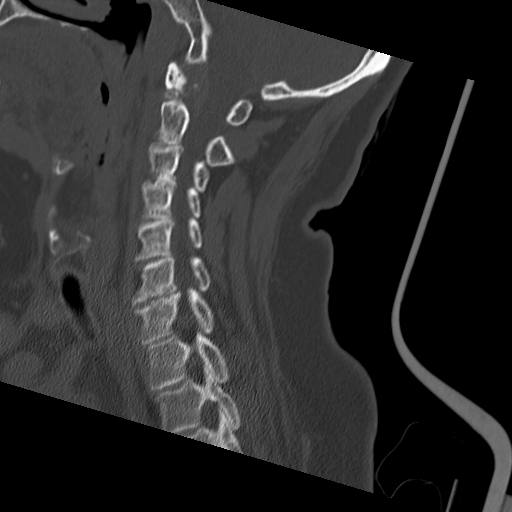
[im 29/43  bone]
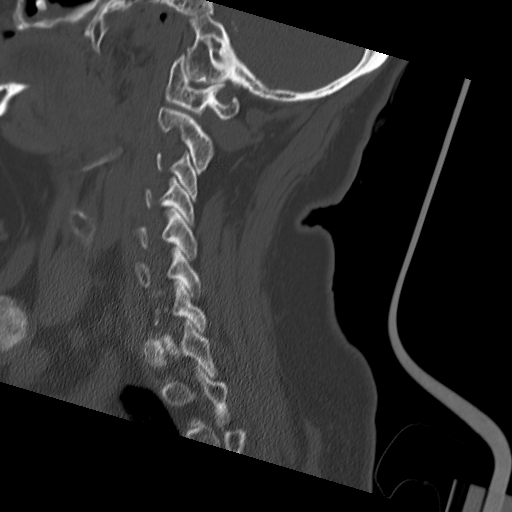

[Series 6: coronal · coronal · 0.42mm/px · 3 of 39 slices shown]
[im 8/39  bone]
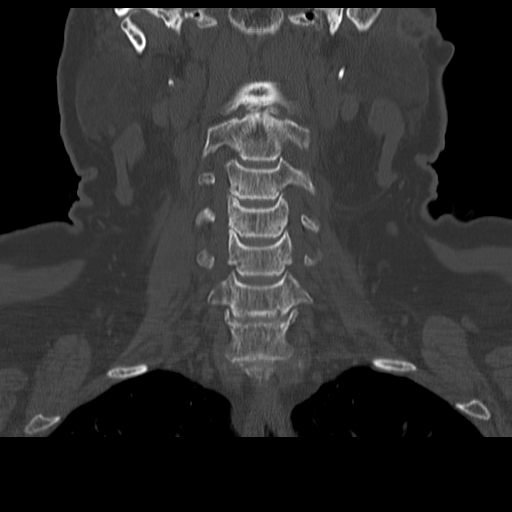
[im 16/39  bone]
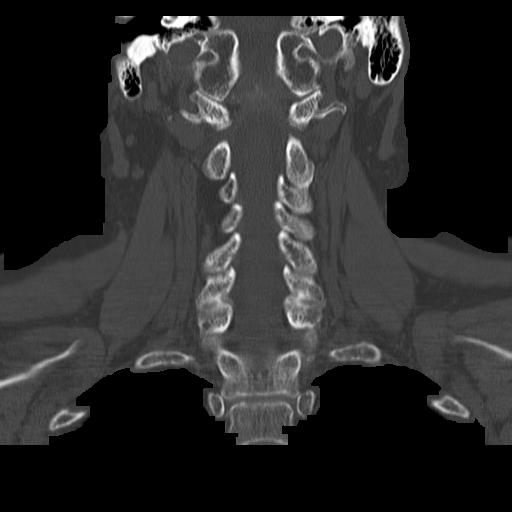
[im 23/39  bone]
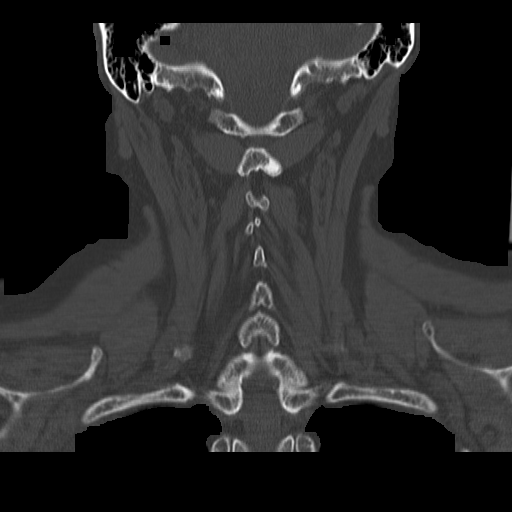

[16 of 33 positions shown; findings below may reference images not displayed]

PROCEDURE:     CT  - CT CERVICAL SPINE WO  - [DATE]  [DATE]

RESULT:     Multislice helical acquisition through the cervical spine is
reconstructed at bone window settings in the axial, coronal and sagittal
planes. There is no reported injury. There is no previous exam for
comparison. Slice thickness is 2 mm. Disc space narrowing is seen at the
C6-C7 level with hypertrophic spurring anteriorly. Degenerative changes and
spurring are seen at the C1-C2 level between the odontoid and anterior arch
of C1. Multilevel bilateral facet hypertrophy is present.
IMPRESSION: Degenerative changes in the cervical spine especially at
C6-C7 and the odontoid and anterior arch of C1. No fracture evident.

## 2012-07-06 LAB — CBC
HCT: 33.5 % — ABNORMAL LOW (ref 35.0–47.0)
HGB: 10.8 g/dL — ABNORMAL LOW (ref 12.0–16.0)
MCH: 26.3 pg (ref 26.0–34.0)
MCHC: 32.2 g/dL (ref 32.0–36.0)
MCV: 82 fL (ref 80–100)
Platelet: 592 10*3/uL — ABNORMAL HIGH (ref 150–440)
RBC: 4.09 10*6/uL (ref 3.80–5.20)
RDW: 17.8 % — ABNORMAL HIGH (ref 11.5–14.5)
WBC: 12.2 10*3/uL — ABNORMAL HIGH (ref 3.6–11.0)

## 2012-07-06 LAB — BASIC METABOLIC PANEL
Anion Gap: 8 (ref 7–16)
BUN: 10 mg/dL (ref 7–18)
Calcium, Total: 9.5 mg/dL (ref 8.5–10.1)
Chloride: 101 mmol/L (ref 98–107)
Co2: 28 mmol/L (ref 21–32)
Creatinine: 0.83 mg/dL (ref 0.60–1.30)
EGFR (African American): >60
EGFR (Non-African Amer.): >60
Glucose: 77 mg/dL (ref 65–99)
Osmolality: 272 (ref 275–301)
Potassium: 2.8 mmol/L — ABNORMAL LOW (ref 3.5–5.1)
Sodium: 137 mmol/L (ref 136–145)

## 2012-07-06 LAB — MAGNESIUM: Magnesium: 1.2 mg/dL — ABNORMAL LOW

## 2012-07-06 LAB — TROPONIN I
Troponin-I: 0.07 ng/mL — ABNORMAL HIGH
Troponin-I: 0.08 ng/mL — ABNORMAL HIGH

## 2012-07-06 IMAGING — CT CT HEAD WITHOUT CONTRAST
1 series · 16 of 30 positions shown, 20 images · non-contrast
Comparison: none

REASON FOR EXAM: HEADACHE
COMMENTS:

[Series 2: soft tissue · axial · 0.40mm/px · z∈[-117,+18]mm · 16 of 31 slices shown, 20 images]
[im 2/31  brain]
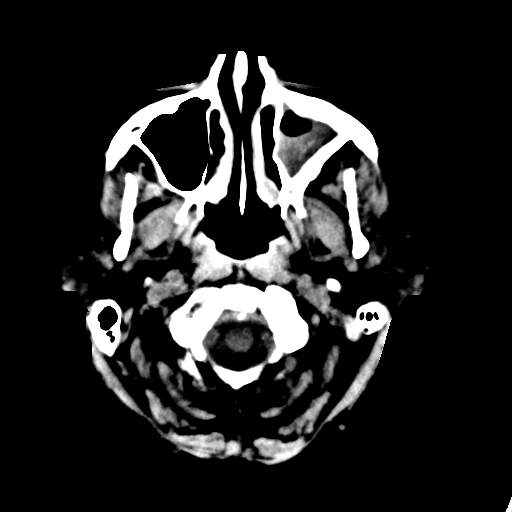
[im 2/31  bone]
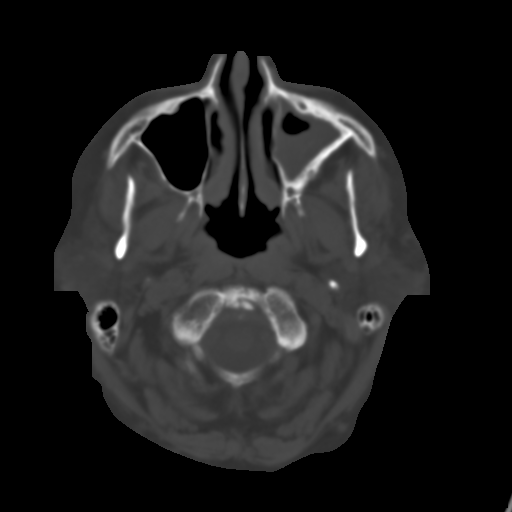
[im 4/31  brain]
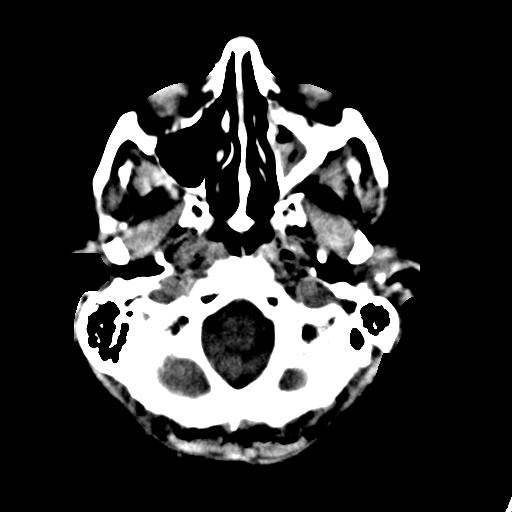
[im 6/31  brain]
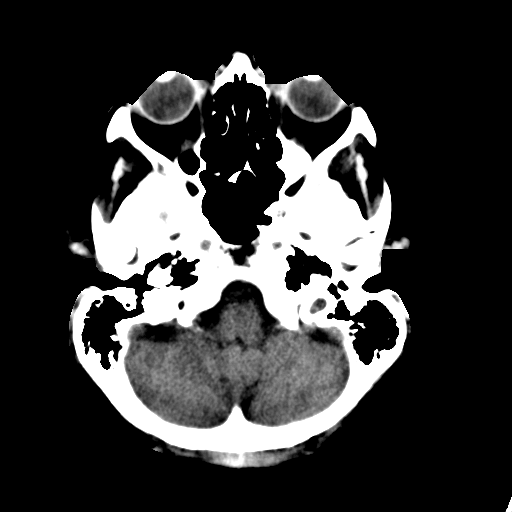
[im 8/31  brain]
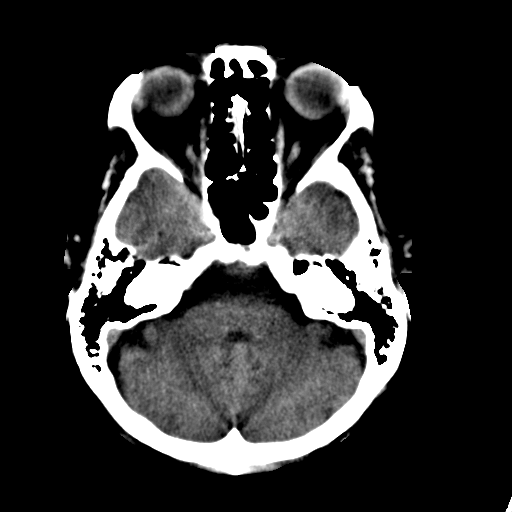
[im 9/31  brain]
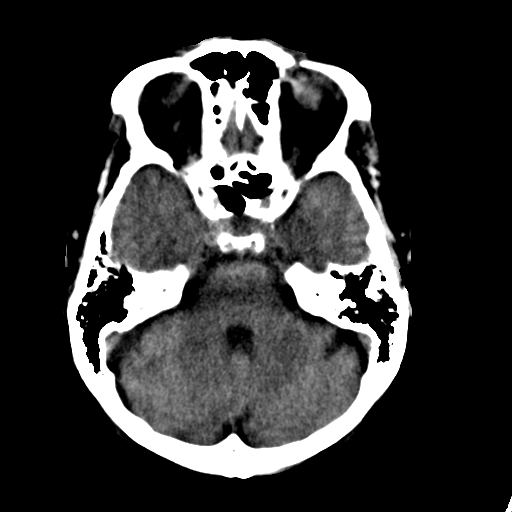
[im 9/31  bone]
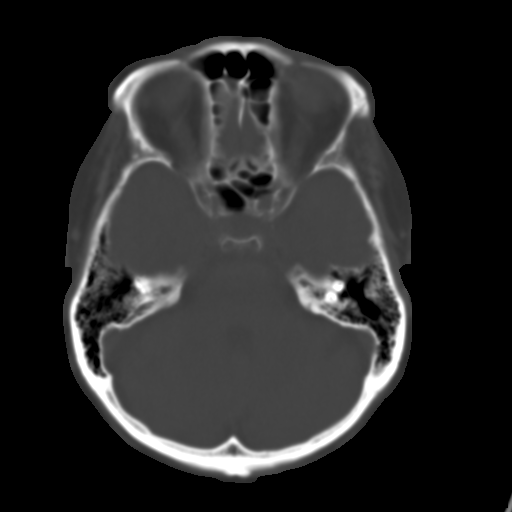
[im 11/31  brain]
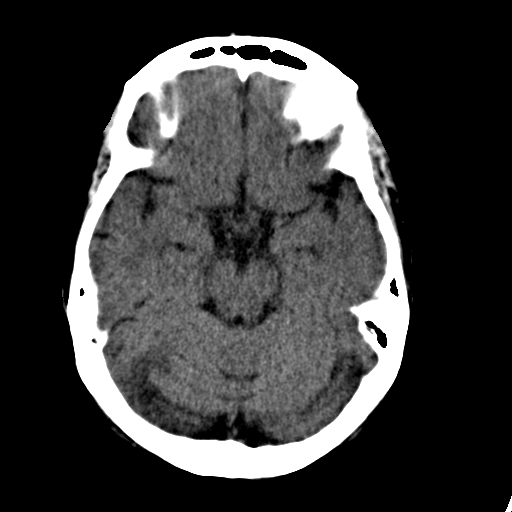
[im 13/31  brain]
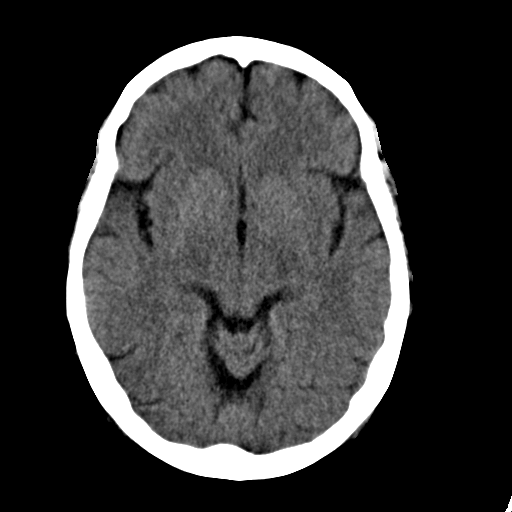
[im 15/31  brain]
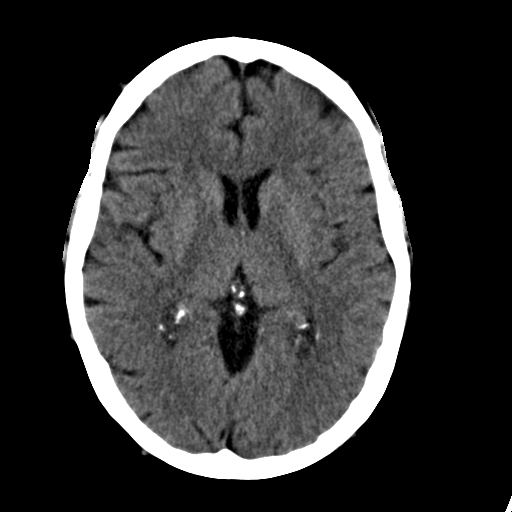
[im 16/31  brain]
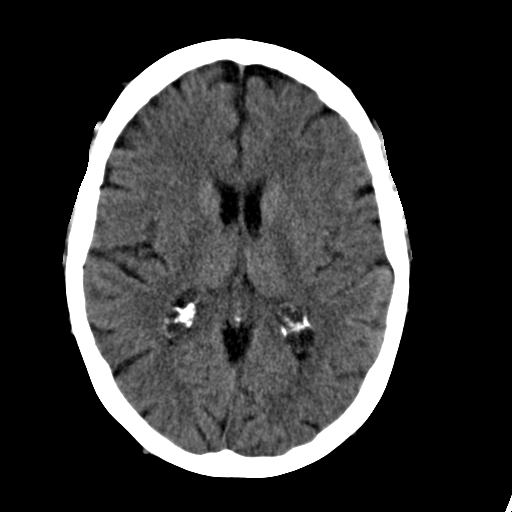
[im 16/31  bone]
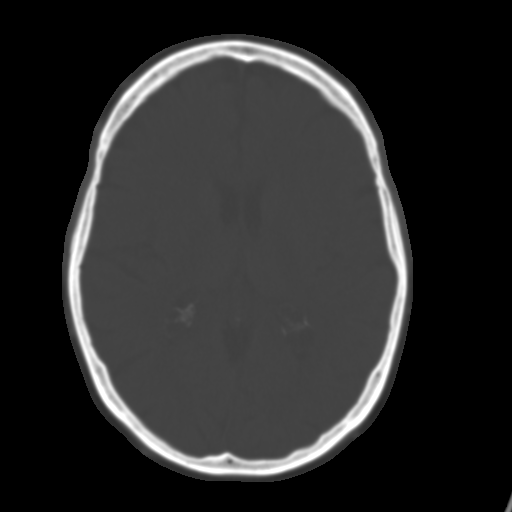
[im 18/31  brain]
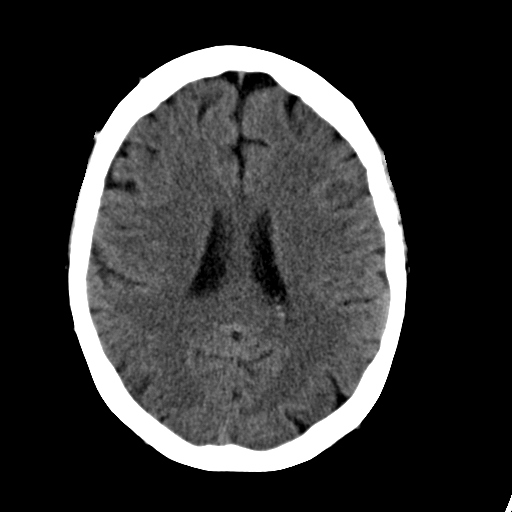
[im 20/31  brain]
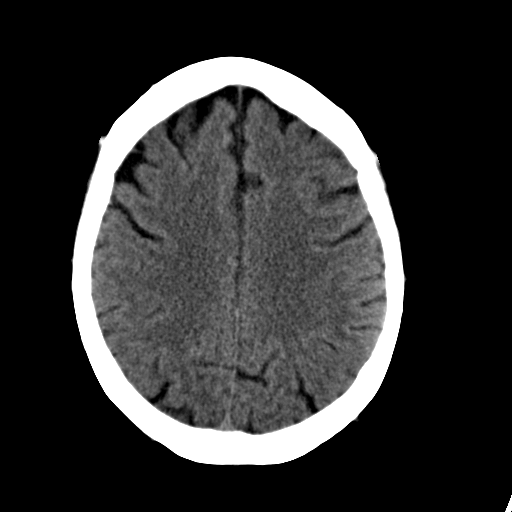
[im 22/31  brain]
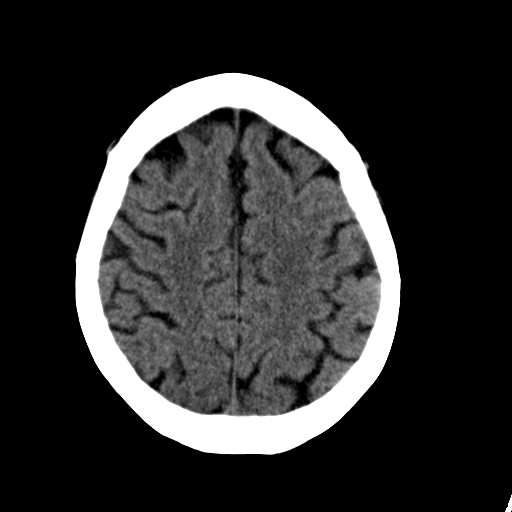
[im 23/31  brain]
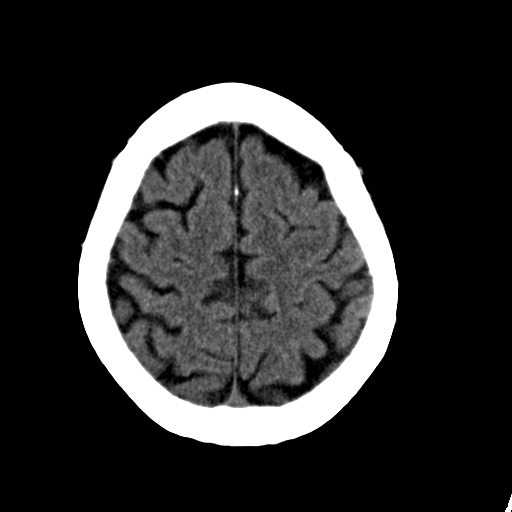
[im 23/31  bone]
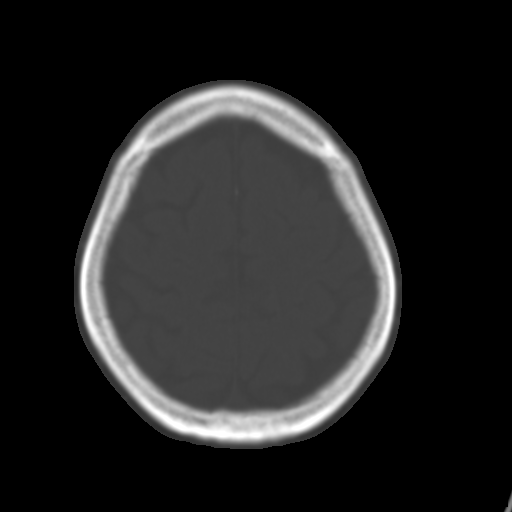
[im 25/31  brain]
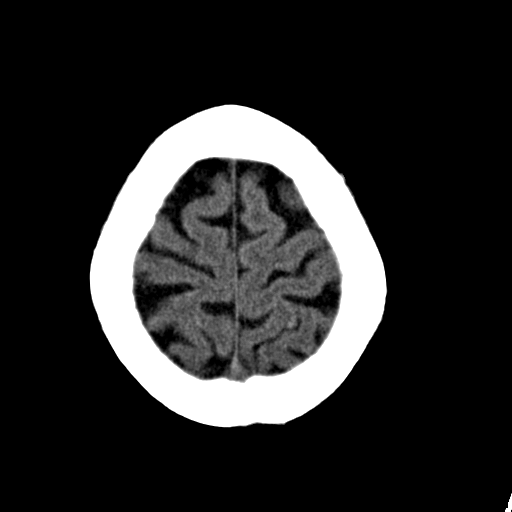
[im 27/31  brain]
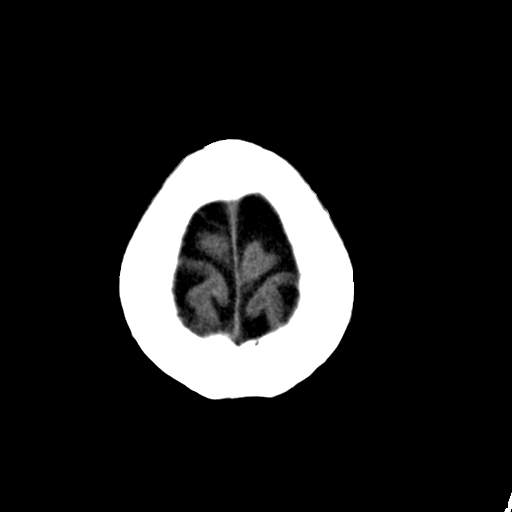
[im 29/31  brain]
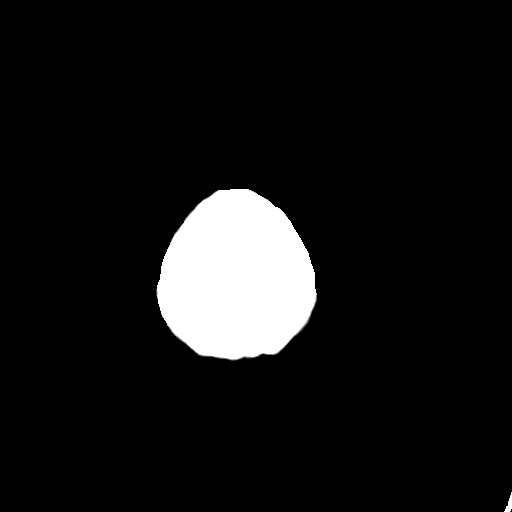

[16 of 30 positions shown; findings below may reference images not displayed]

PROCEDURE:     CT  - CT HEAD WITHOUT CONTRAST  - [DATE]  [DATE]

RESULT:     There is an air-fluid level in the left maxillary sinus with
significant mucosal thickening consistent with acute maxillary sinusitis.
The sinuses otherwise appear clear as are the mastoid air cells. The
calvarium shows no bony destruction or depressed skull fracture. The
ventricles and sulci appear to be normal for the patient's age. There is no
intracranial hemorrhage, mass, mass effect or midline shift.
IMPRESSION: 1. Left maxillary sinusitis. No other significant acute intracranial
abnormality.

[REDACTED]

## 2012-07-06 IMAGING — CR DG CHEST 2V
1 series · 2 of 2 positions shown · non-contrast
Comparison: none

REASON FOR EXAM: CHEST PAIN
COMMENTS:

[Series 1: w chest pa · 0.14mm/px · 2 of 2 slices shown]
[im 1/2]
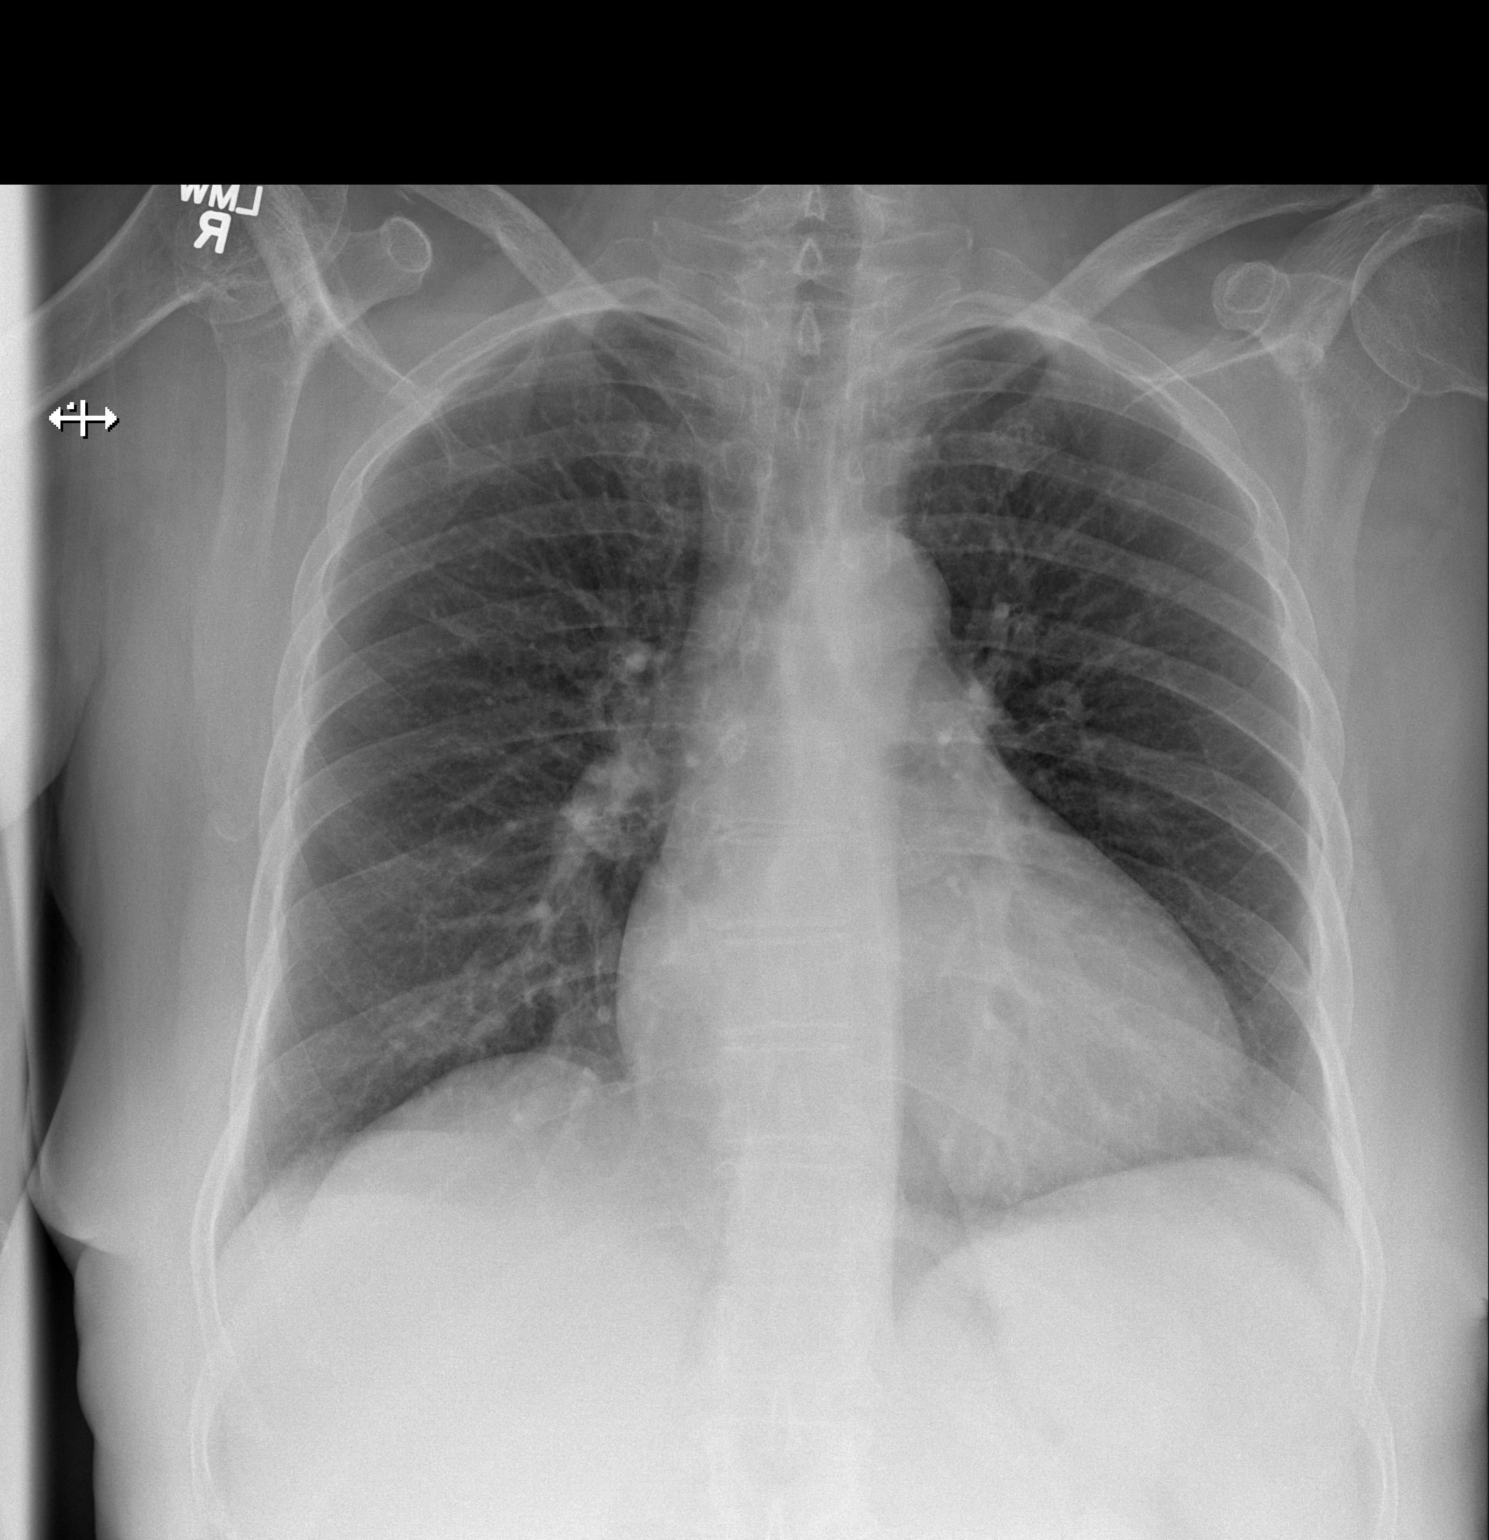
[im 2/2]
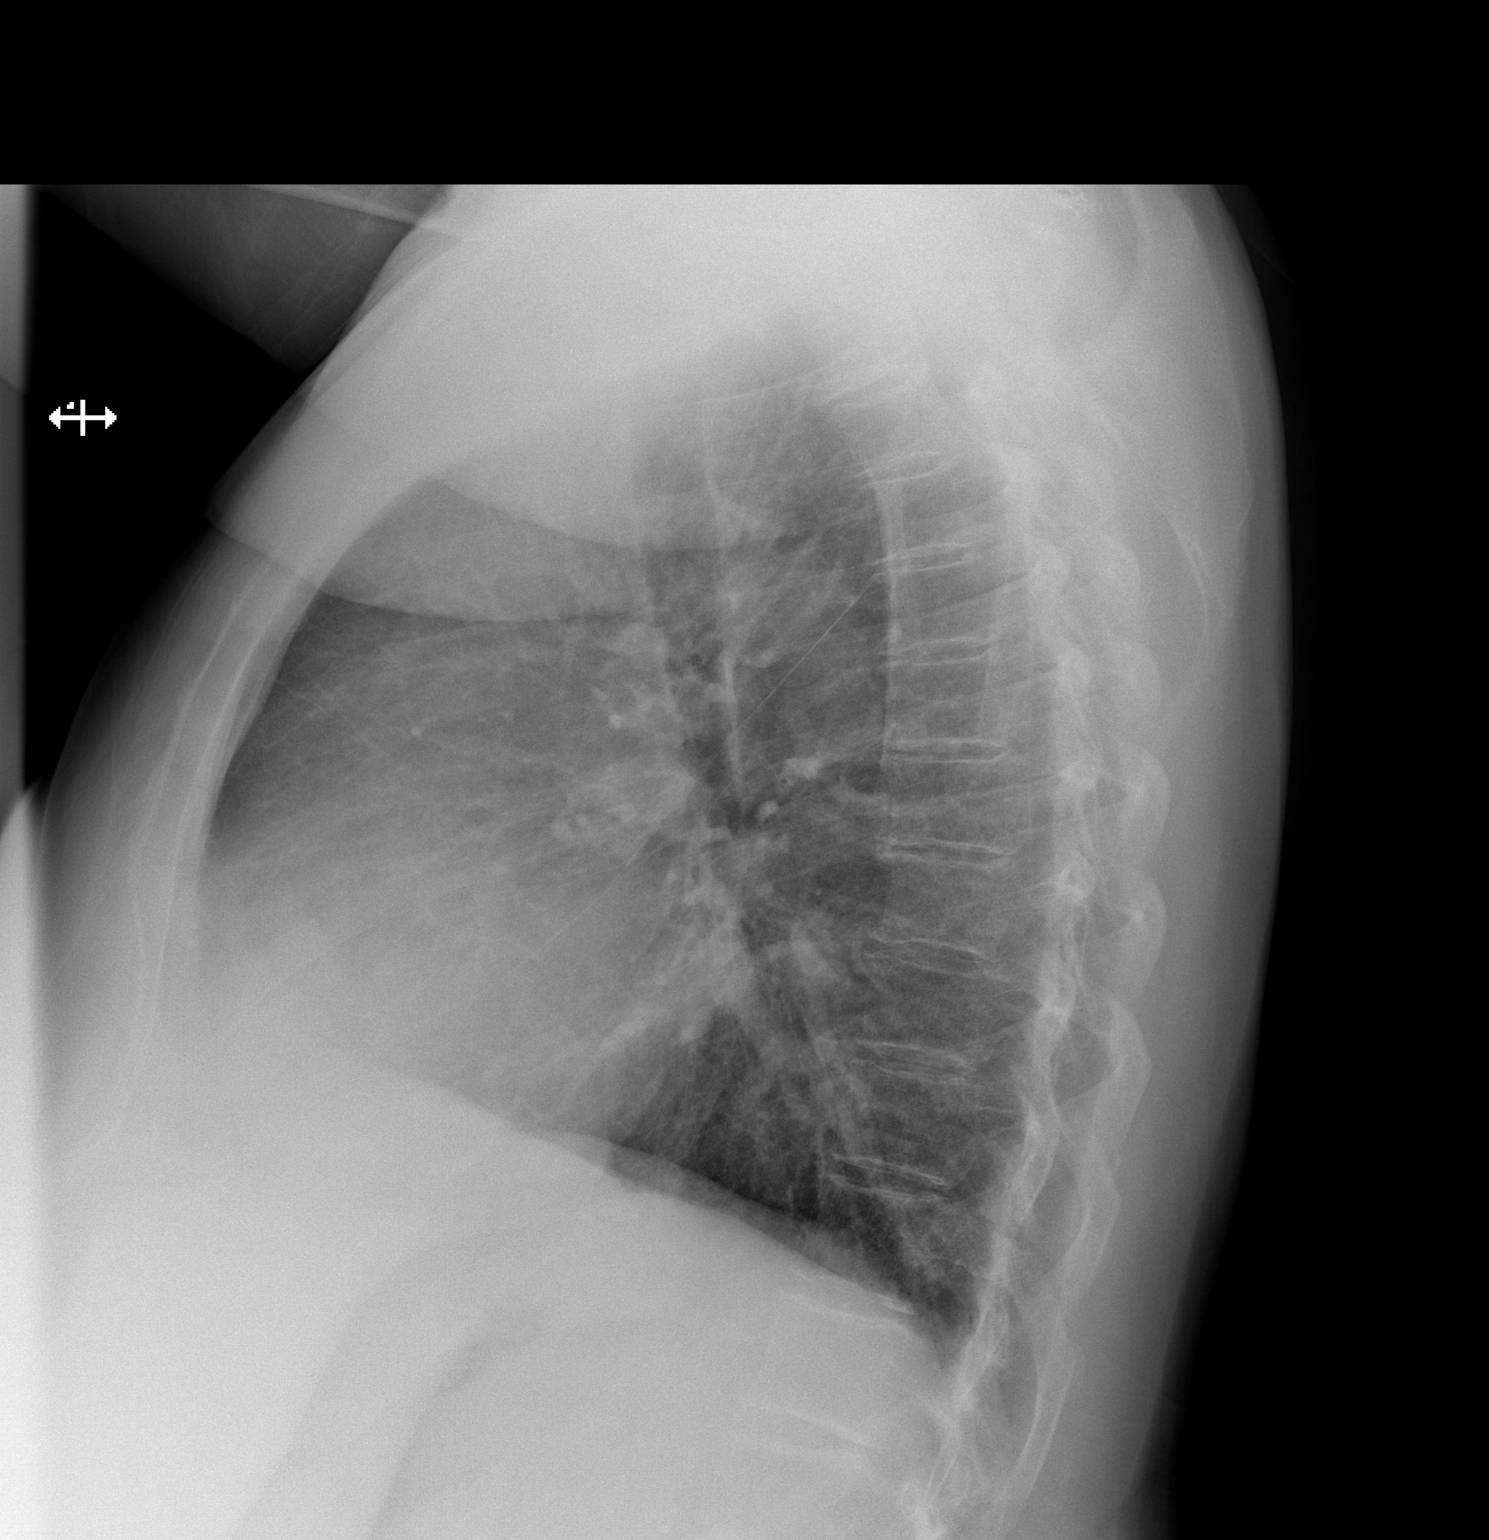

[2 of 2 positions shown; findings below may reference images not displayed]

PROCEDURE:     DXR - DXR CHEST PA (OR AP) AND LATERAL  - [DATE]  [DATE]

RESULT:     The lungs are hyperinflated. The lungs are clear. The heart and
pulmonary vessels are normal. The bony and mediastinal structures are
unremarkable. There is no effusion. There is no pneumothorax or evidence of
congestive failure.
IMPRESSION: No acute cardiopulmonary disease.

[REDACTED]

## 2012-07-07 ENCOUNTER — Inpatient Hospital Stay: Payer: Self-pay | Admitting: Internal Medicine

## 2012-07-07 LAB — POTASSIUM: Potassium: 3.1 mmol/L — ABNORMAL LOW (ref 3.5–5.1)

## 2012-07-07 LAB — TROPONIN I: Troponin-I: 0.06 ng/mL — ABNORMAL HIGH

## 2012-07-08 LAB — BASIC METABOLIC PANEL
Anion Gap: 7 (ref 7–16)
BUN: 12 mg/dL (ref 7–18)
Calcium, Total: 8.6 mg/dL (ref 8.5–10.1)
Chloride: 106 mmol/L (ref 98–107)
Co2: 27 mmol/L (ref 21–32)
Creatinine: 0.77 mg/dL (ref 0.60–1.30)
EGFR (African American): >60
EGFR (Non-African Amer.): >60
Glucose: 102 mg/dL — ABNORMAL HIGH (ref 65–99)
Osmolality: 279 (ref 275–301)
Potassium: 3.7 mmol/L (ref 3.5–5.1)
Sodium: 140 mmol/L (ref 136–145)

## 2012-07-08 LAB — MAGNESIUM: Magnesium: 1.8 mg/dL

## 2013-08-02 ENCOUNTER — Ambulatory Visit: Payer: Self-pay | Admitting: Vascular Surgery

## 2013-08-02 IMAGING — CT CT ANGIOGRAPHY NECK
2 of 7 series · 9 of 33 positions shown · IV contrast (APPLIED)
Comparison: CT head [DATE]

CLINICAL DATA: Giant cell arteritis.  Left eye blindness.

EXAM:
CT ANGIOGRAPHY NECK
TECHNIQUE: Multidetector CT imaging of the neck was performed using the
standard protocol during bolus administration of intravenous
contrast. Multiplanar CT image reconstructions and MIPs were
obtained to evaluate the vascular anatomy. Carotid stenosis
measurements (when applicable) are obtained utilizing NASCET
criteria, using the distal internal carotid diameter as the
denominator.
CONTRAST:  80 mL Isovue 370 IV

[Series 5: cta neck · axial · 0.39mm/px · z∈[-237,-117]mm · 3 of 161 slices shown]
[im 41/161  soft-tissue]
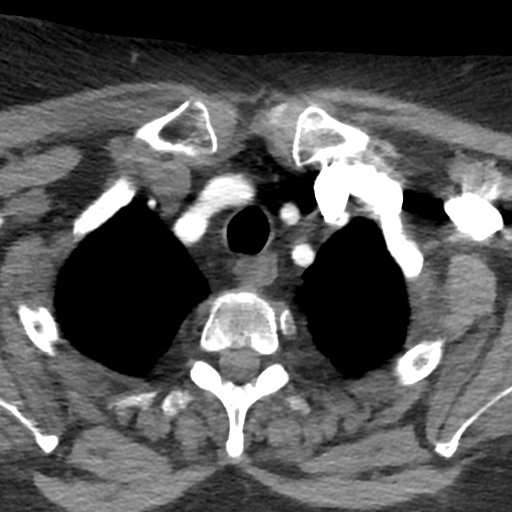
[im 81/161  soft-tissue]
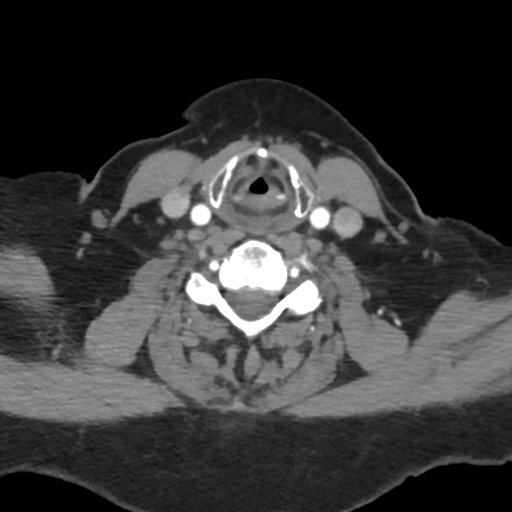
[im 121/161  soft-tissue]
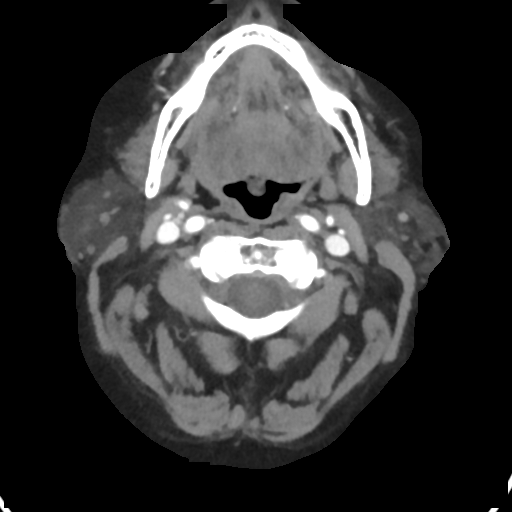

[Series 6: ax thin · axial · 0.39mm/px · z∈[-262,-92]mm · 6 of 240 slices shown]
[im 35/240  soft-tissue]
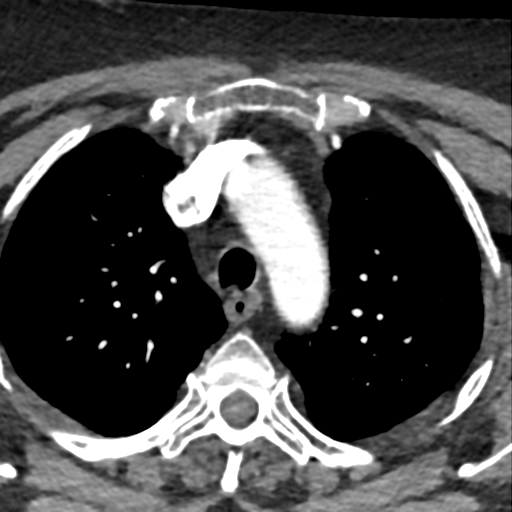
[im 69/240  bone]
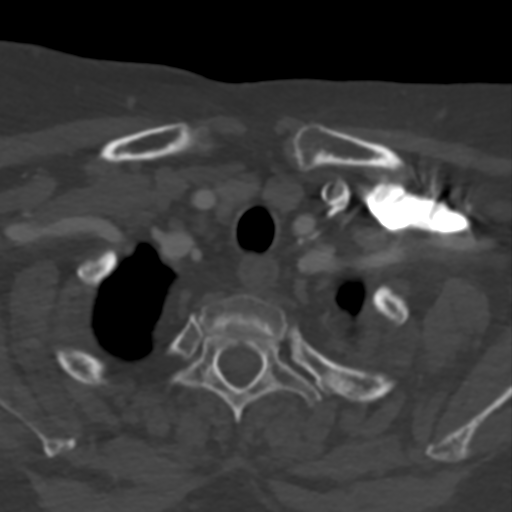
[im 103/240  soft-tissue]
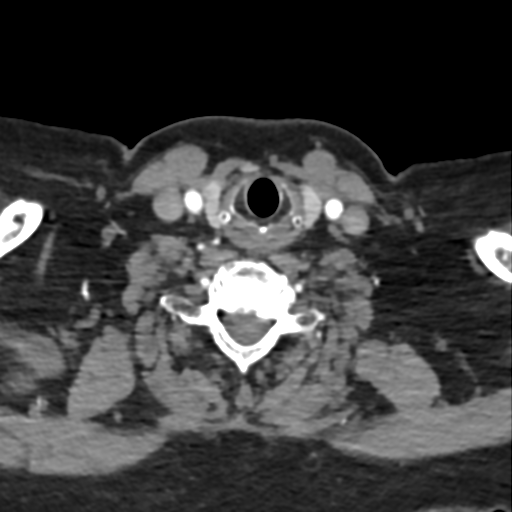
[im 137/240  bone]
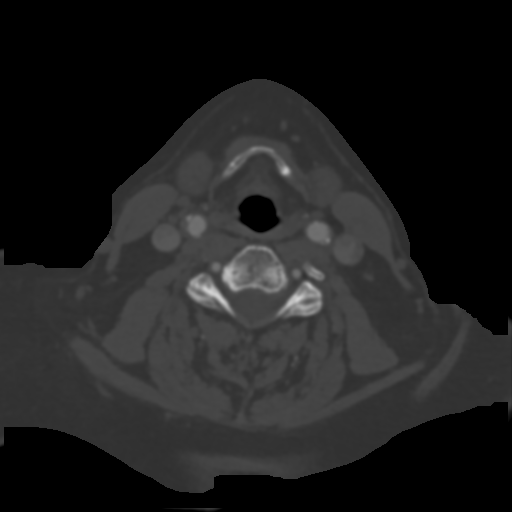
[im 171/240  soft-tissue]
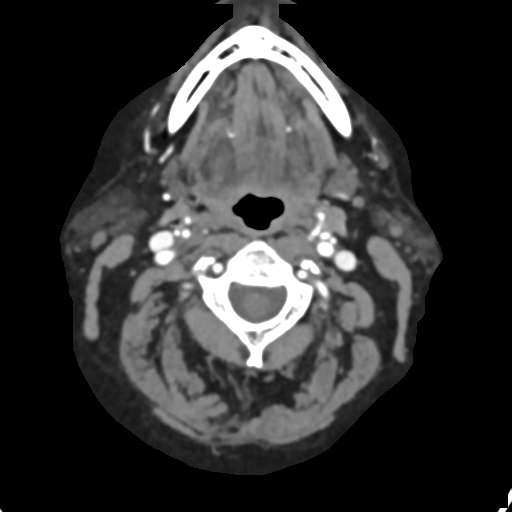
[im 205/240  bone]
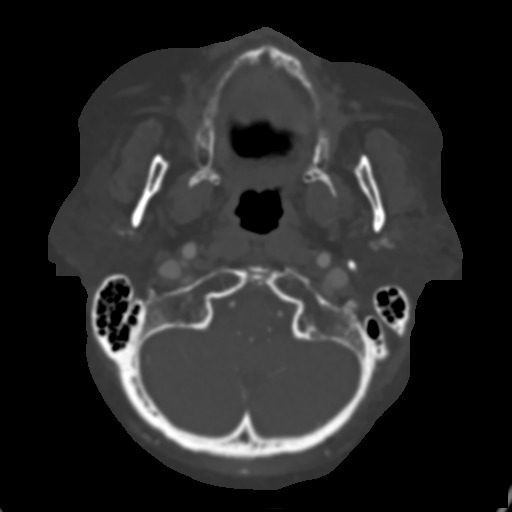

[9 of 33 positions shown; findings below may reference images not displayed]

FINDINGS: Negative for mass or adenopathy in the neck. Lung apices are clear.
Mild cervical degenerative changes. No acute bony change identified.

Right carotid: Right common carotid artery is widely patent. There
is some misregistration in the lower common carotid artery due to
movement during the acquisition. Mild atherosclerotic disease at the
right carotid bifurcation calcified and noncalcified plaque
extending into the carotid bulb. No significant carotid stenosis.
Negative for dissection. No thickening of the carotid wall.

Left carotid: Left common carotid artery is widely patent.
Atherosclerotic plaque is present at the left carotid bifurcation
with calcified and noncalcified plaque extending into the carotid
bulb. This narrows the lumen by approximately 25% diameter stenosis.
There is mild irregularity of the plaque in the internal carotid
with plaque ulceration which could be a source of emboli and the
patient's symptoms. Negative for dissection.

Vertebral arteries: Both vertebral arteries are widely patent to the
basilar without significant stenosis or dissection.

Review of the MIP images confirms the above findings.
IMPRESSION: Mild atherosclerotic disease in the right carotid bifurcation
without significant stenosis

Atherosclerotic plaque involving the left carotid bifurcation. There
is plaque ulceration in the carotid bulb on the left which may be a
source of emboli and Stroke. 25% diameter stenosis left internal
carotid artery.

Both vertebral arteries are widely patent.

## 2014-03-26 DIAGNOSIS — H47012 Ischemic optic neuropathy, left eye: Secondary | ICD-10-CM | POA: Diagnosis not present

## 2014-04-09 DIAGNOSIS — Z87891 Personal history of nicotine dependence: Secondary | ICD-10-CM | POA: Diagnosis not present

## 2014-04-09 DIAGNOSIS — M79609 Pain in unspecified limb: Secondary | ICD-10-CM | POA: Diagnosis not present

## 2014-04-23 DIAGNOSIS — I776 Arteritis, unspecified: Secondary | ICD-10-CM | POA: Diagnosis not present

## 2014-05-07 DIAGNOSIS — R011 Cardiac murmur, unspecified: Secondary | ICD-10-CM | POA: Diagnosis not present

## 2014-05-07 DIAGNOSIS — I6629 Occlusion and stenosis of unspecified posterior cerebral artery: Secondary | ICD-10-CM | POA: Diagnosis not present

## 2014-05-07 DIAGNOSIS — I517 Cardiomegaly: Secondary | ICD-10-CM | POA: Diagnosis not present

## 2014-05-07 DIAGNOSIS — Z136 Encounter for screening for cardiovascular disorders: Secondary | ICD-10-CM | POA: Diagnosis not present

## 2014-05-07 DIAGNOSIS — I6522 Occlusion and stenosis of left carotid artery: Secondary | ICD-10-CM | POA: Diagnosis not present

## 2014-05-07 DIAGNOSIS — I5020 Unspecified systolic (congestive) heart failure: Secondary | ICD-10-CM | POA: Diagnosis not present

## 2014-05-28 DIAGNOSIS — I776 Arteritis, unspecified: Secondary | ICD-10-CM | POA: Diagnosis not present

## 2014-06-04 DIAGNOSIS — E119 Type 2 diabetes mellitus without complications: Secondary | ICD-10-CM | POA: Diagnosis not present

## 2014-06-04 DIAGNOSIS — I10 Essential (primary) hypertension: Secondary | ICD-10-CM | POA: Diagnosis not present

## 2014-06-04 DIAGNOSIS — R5383 Other fatigue: Secondary | ICD-10-CM | POA: Diagnosis not present

## 2014-06-04 DIAGNOSIS — E559 Vitamin D deficiency, unspecified: Secondary | ICD-10-CM | POA: Diagnosis not present

## 2014-06-04 DIAGNOSIS — N390 Urinary tract infection, site not specified: Secondary | ICD-10-CM | POA: Diagnosis not present

## 2014-06-04 DIAGNOSIS — F419 Anxiety disorder, unspecified: Secondary | ICD-10-CM | POA: Diagnosis not present

## 2014-06-04 DIAGNOSIS — E780 Pure hypercholesterolemia: Secondary | ICD-10-CM | POA: Diagnosis not present

## 2014-07-04 DIAGNOSIS — I776 Arteritis, unspecified: Secondary | ICD-10-CM | POA: Diagnosis not present

## 2014-07-11 NOTE — Consult Note (Signed)
Patient seen and consult dictated. She had positive stress test in office , and was to have CCTA, but now will be st up for cath monday.  Electronic Signatures: Radene KneeKhan, Jhonny Calixto Ali (MD)  (Signed on 19-Apr-14 10:48)  Authored  Last Updated: 19-Apr-14 10:48 by Radene KneeKhan, Hailyn Zarr Ali (MD)

## 2014-07-11 NOTE — Consult Note (Signed)
PATIENT NAME:  Jamie Benitez, Jamie Benitez MR#:  [id number] DATE OF BIRTH:  October 28, 1945  DATE OF CONSULTATION:  07/07/2012  REFERRING PHYSICIAN:   CONSULTING PHYSICIAN:  Laurier NancyShaukat A. Kaysie Michelini, MD  INDICATION FOR CONSULTATION:  Chest pain.   HISTORY OF PRESENT ILLNESS:  This is a 69 year old white female who is very well known to me. I saw her last week in the office. She came into the hospital with chest pain. The chest pain was described as pressure-type, left precordial associated with shortness of breath and diaphoresis. The chest pain was 8/10, lasted for like 15 minutes thus she came into the Emergency Room. Her troponins were mildly elevated and thus I was asked to evaluate the patient. The patient was seen last Thursday in the office because of an abnormal stress test. She had inferolateral ischemia with a normal EF and she was set up for CP of coronaries but in the meantime she had chest pain. She was going to have CT of coronaries this Friday. She denies any chest pain at this time.   PAST MEDICAL HISTORY:  A history of hypertension, diabetes, a history of coronary artery disease status post PCI and stenting.   SOCIAL HISTORY:  Continues to smoke 1 pack per day. No history of EtOH abuse.   FAMILY HISTORY:  Positive for coronary artery disease.   PHYSICAL EXAMINATION:  GENERAL:  She is alert, oriented x 3, in no acute distress right now.  VITAL SIGNS:  Stable.  HEENT:  No JVD.  LUNGS:  Clear.  HEART:  Regular rate and rhythm. Normal S1, S2. No audible murmur.  ABDOMEN:  Soft, nontender, positive bowel sounds.  EXTREMITIES:  No pedal edema.  NEUROLOGIC:  She appears to be intact.   EKG showed normal sinus rhythm, 82 beats per minute, right bundle branch block, nonspecific ST-T changes, prolonged QT interval most likely secondary to hypokalemia. Troponin, the initial one was 0.07, then 0.08 and now it is 0.06, hemoglobin is 10.8.   ASSESSMENT AND PLAN:  Non-ST segment elevation myocardial infarction  with a history of PCI and stenting. Advised starting the patient on Lovenox and aspirin and Plavix and we will do a left heart catheterization Monday. Thank you very much for the referral.  ____________________________ Laurier NancyShaukat A. Deaunna Olarte, MD sak:jm D: 07/07/2012 10:47:08 ET T: 07/07/2012 11:01:43 ET JOB#: [id number]  cc: Laurier NancyShaukat A. Ulysess Witz, MD, <Dictator> Laurier NancySHAUKAT A Larell Baney MD ELECTRONICALLY SIGNED 07/13/2012 8:50

## 2014-07-11 NOTE — Discharge Summary (Signed)
PATIENT NAME:  Jamie Benitez, Jamie Benitez MR#:  [id number] DATE OF BIRTH:  January 23, 1946  DATE OF ADMISSION:  07/07/2012 DATE OF DISCHARGE:  07/09/2012  ADMITTING DIAGNOSIS: Chest pain.   DISCHARGE DIAGNOSES: 1. 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  2. Non-ST myocardial infarction.  3. Headache, felt to be due to possible sinusitis, on antibiotics.  4. Slightly prolonged QT.  5. Electrolyte imbalances which have been replaced.  6. Diabetes, the patient is on sliding scale glipizide.  Metformin was held for the catheterization.  7. Anemia, which is very mild.  8. Previous history of coronary artery disease, status post stents.  9. Nicotine addiction.  10. Psoriasis.  11. Status post hysterectomy.  12. Status post right rotator cuff repair.  13. Status post tonsillectomy.   PERTINENT LABORATORY, DIAGNOSTIC AND RADIOLOGICAL DATA: EKG on admission showed sinus rhythm with short PR.   QTc was  525 ms. CT scan of the head showed left maxillary sinusitis, no other abnormality. PA and lateral chest x-ray showed no acute cardiopulmonary processes.   WBC 12.2, hemoglobin 10.8, platelet count was 592. BMP: Glucose was 77, BUN 18, creatinine 0.83, sodium 137, potassium 2.8, chloride 101, CO2 was 28, calcium 9.5.  Magnesium was 1.2. Troponin 0.07, subsequent troponin 0.08.    Cardiac catheterization showed right dominant proximal LAD 50% stenosis, mid LAD 80% stenosis, mid circumflex 90% stenosis, mid RCA 80% stenosis.   HOSPITAL COURSE: Please refer to H and P done by the admitting physician. The patient is a 69 year old white female with a history of coronary artery disease, previous stents, hypertension, diabetes and chronic smoker, presented with having left-sided chest pain. She has been seen by Cardiology as an outpatient and had an abnormal stress test.  She had a follow-up appointment, however, before she could do a follow-up  appointment she came in with chest pain. The patient had a slightly elevated troponin. She was seen by Dr. Welton FlakesKhan, and he recommended that she would need a cardiac catheterization. He also felt that she had a non-ST MI. The patient was hospitalized and underwent a cardiac cath today which shows 3-vessel disease as above. At this point, Dr. Welton FlakesKhan has made arrangements with [facility] for her to be transferred for 3-vessel bypass. At this time, the patient is stable for transfer for further treatment.   She was also thought to have possible sinusitis and has been treated with Augmentin. She also had severe electrolyte imbalances that were treated. At this time, she is stable [facility].   TRANSFER MEDICATIONS: Tylenol 650 mg every 4 hours p.r.n., Augmentin 875, 1 tab p.o. b.i.d., aspirin 325 mg p.o. daily, Coreg 6.25 p.o. b.i.d., heparin 5000 subcutaneous q. 12, sliding scale insulin, mag oxide 400 p.o. q. 8, morphine 2 to 4 mg IV q. 4 p.r.n. for pain, nicotine patch 21 mg transdermally, omeprazole 20 daily, Crestor 40 daily, Ambien 5 at bedtime p.r.n., fluocinolone .01% cream apply to affected area, Percocet 5/325 q. 6 p.r.n.   DISPOSITION: To [facility].   DISCHARGE FOLLOWUP:  Once the patient has her bypass, she needs to follow up with her primary care MD, that to be determined.   TIME SPENT: 40 minutes spent on the discharge.   ____________________________ Lacie ScottsShreyang H. Allena KatzPatel, MD shp:cb D: 07/09/2012 13:55:55 ET T: 07/09/2012 14:03:31 ET JOB#: [id number]  cc: Arpan Eskelson H. Allena KatzPatel, MD, <Dictator> Charise CarwinSHREYANG H Bettyann Birchler MD ELECTRONICALLY SIGNED 07/15/2012 13:06

## 2014-07-11 NOTE — H&P (Signed)
PATIENT NAME:  Jamie, CARE Benitez#:  [id number] DATE OF BIRTH:  1945/11/10  DATE OF ADMISSION:  07/07/2012  PRIMARY CARE PHYSICIAN:  Dr. Evelene Croon.   REFERRING PHYSICIAN:  Dr. Jene Every.   CHIEF COMPLAINT:  Chest pain and headache.   HISTORY OF PRESENT ILLNESS:  The patient is a 69 year old Caucasian female with history of coronary artery disease, hypertension, diabetes mellitus, chronic smoker, was in her usual state of health until the last two days reporting left-sided recurrent chest pain, pressure-like feeling on and off.  The severity may reach 8 on a scale of 10.  Her pain is now down to very mild in intensity.  There is no radiation for the pain.  She is also concerned about left-sided headache on the left forehead area.  She had some nausea with that and an episode of vomiting.  She also reports some blurring of vision, however these symptoms had eased off by the time I saw the patient.  She denies having any fever.  No chills.  No syncope.  No palpitations.  Evaluation at the Emergency Department revealed a slight elevation of troponin 0.07, then 0.08.  Also found to have significant hypokalemia with a potassium of 2.8.  Also noted on her EKG that her QT interval and QTc were prolonged.  The patient was admitted for observation and follow-up on cardiac enzymes and further management.    REVIEW OF SYSTEMS: CONSTITUTIONAL:  The patient denies having any fever.  No chills.  No fatigue.  EYES:  Reported some blurring of vision on the left eye, but this seems to be eased off.  No double vision.  EARS, NOSE, THROAT:  No hearing impairment.  No sore throat.  No dysphagia.  CARDIOVASCULAR:  Reports left-sided chest pain as above.  No shortness of breath.  No edema.  No syncope.  RESPIRATORY:  No cough.  No sputum production.  No hemoptysis.  No shortness of breath.  GASTROINTESTINAL:  No abdominal pain.  She had an episode of vomiting.  No hematemesis.  No melena.  No hematochezia.  No  diarrhea.  GENITOURINARY:  No dysuria.  No frequency of urination.  MUSCULOSKELETAL:  No joint pain.  No swelling.  No muscular pain or swelling.  INTEGUMENTARY:  She has a rash at different parts of her body consistent with psoriasis.  No ulcers.  NEUROLOGY:  No focal weakness.  No seizure activity.  No ataxia.  PSYCHIATRY:  No anxiety.  No depression.  ENDOCRINE:  No polyuria or polydipsia.  No heat or cold intolerance.   PAST MEDICAL HISTORY:  Coronary artery disease status post stent implants many years ago, diabetes mellitus type 2, chronic smoking, psoriasis.   PAST SURGICAL HISTORY:  Cardiac stent implants, hysterectomy, right rotator cuff repair, tonsillectomy.   SOCIAL HABITS:  Chronic smoker 1 pack per day since age of 69.  No history of alcohol or drug abuse.   SOCIAL HISTORY:  She is divorced, lives at home alone.  She retired, used to work on a Psychologist, clinical.   FAMILY HISTORY:  Her father died at the age of 31 from a heart attack.  Her mother died at age of 67 with COPD.   ADMISSION MEDICATIONS:  Crestor 40 mg once a day, chlorthalidone 25 mg once a day, glipizide 10 mg a day, metformin 850 mg twice a day, omeprazole 20 mg a day, Ambien 5 mg at night.   ALLERGIES:  SULFA CAUSING SKIN RASH.  ALSO REPORTED VIOXX.  SHE  IS ALSO ALLERGIC TO LATEX.   PHYSICAL EXAMINATION: VITAL SIGNS:  Blood pressure 132/64, respiratory rate 20, pulse 78, temperature 98.7, oxygen saturation 96%.  GENERAL APPEARANCE:  Elderly female lying in bed using the telephone talking to her relatives in no apparent distress.  HEAD AND NECK:  No pallor.  No icterus.  No cyanosis.  EARS, NOSE, THROAT:  Ear examination revealed slightly impaired hearing, no discharge, no lesions.  Examination of the nose showed no ulcers, no discharge, no bleeding.  Examination of the oropharyngeal area showed normal lips and tongue.  No oral thrush.  She is edentulous, not wearing any dentures.  EYES:  Revealed normal eyelids  and conjunctivae.  Pupils about 6 to 7 mm, round, equal and reactive to light.  NECK:  Supple.  Trachea at midline.  No thyromegaly.  No cervical lymphadenopathy.  HEART:  Revealed normal S1, S2.  No S3, S4.  No murmur.  No gallop.  No carotid bruits.  RESPIRATORY:  Normal breathing pattern without use of accessory muscles.  No rales.  No wheezing.  ABDOMEN:  Obese, soft without tenderness.  No hepatosplenomegaly.  No masses.  No hernias.  SKIN:  Revealed multiple skin lesions, rash associated with thickening of the skin consistent with her psoriasis.  This is more prominent in the lower extremities, especially the heel area and the shin/calf area.  Also on her hands.  No ulcers.  MUSCULOSKELETAL:  No joint swelling.  No clubbing.  NEUROLOGIC:  Cranial nerves II through XII are intact.  No focal motor deficit.  PSYCHIATRY:  The patient is alert and oriented x 3.  Mood and affect were normal.   LABORATORY FINDINGS:  Chest x-ray showed no acute cardiopulmonary abnormality.  CT scan of the head showed left maxillary sinusitis.  Her EKG showed normal sinus rhythm at a rate of 82 per minute.  Nonspecific T-wave abnormalities or flattening of T-waves.  Prolonged QT and QTc.  Serum glucose 77, BUN 10, creatinine 0.8, sodium 137, potassium 2.8.  Bicarbonate 28, calcium 9.5, magnesium was 1.2.  Troponin 0.07 and 0.08.  CBC showed white count of 12,000, hemoglobin 10.8, hematocrit 33, platelet count 592.  Normal indices of MCV, MCH, and MCHC.   ASSESSMENT: 1.  Left-sided chest pain with the patient has multiple risk factors.  2.  Slight elevation of troponin.  3.  Headache.  4.  Prolonged QT and QTc.  5.  Hypokalemia.  6.  Hypomagnesemia.  7.  Diabetes mellitus type 2.  8.  Chronic smoker.  9.  Anemia, normocytic, normochromic.  10.  Coronary artery disease, status post stent implants.   PLAN:  We will admit the patient for observation.  Follow up on cardiac enzymes.  Consult her cardiologist, Dr.  Adrian Blackwater.  Replacement of potassium and magnesium.  Repeat potassium level in the morning.  I will continue her statin using Crestor.  I will add a small dose of beta-blocker using Coreg 6.25 mg twice a day.  Aspirin 325 mg a day.  Subcutaneous heparin 5000 twice a day for deep vein thrombosis prophylaxis.  The patient was advised to quit smoking.  I placed her on a nicotine patch.  I also advised her to see an ophthalmologist should she have recurrence of blurring of vision associated with headache.   Time spent in evaluating this patient took more than 1 hour.      ____________________________ Carney Corners. Rudene Re, MD amd:ea D: 07/06/2012 23:57:47 ET T: 07/07/2012 06:54:27 ET JOB#: [id number]  cc:  Carney CornersAmir M. Rudene Rearwish, MD, <Dictator> Zollie ScaleAMIR M Meeyah Ovitt MD ELECTRONICALLY SIGNED 07/07/2012 22:57

## 2014-09-16 DIAGNOSIS — R609 Edema, unspecified: Secondary | ICD-10-CM | POA: Diagnosis not present

## 2014-09-16 DIAGNOSIS — K219 Gastro-esophageal reflux disease without esophagitis: Secondary | ICD-10-CM | POA: Diagnosis not present

## 2014-09-16 DIAGNOSIS — E119 Type 2 diabetes mellitus without complications: Secondary | ICD-10-CM | POA: Diagnosis not present

## 2014-09-16 DIAGNOSIS — R5383 Other fatigue: Secondary | ICD-10-CM | POA: Diagnosis not present

## 2014-09-16 DIAGNOSIS — J449 Chronic obstructive pulmonary disease, unspecified: Secondary | ICD-10-CM | POA: Diagnosis not present

## 2014-09-16 DIAGNOSIS — M545 Low back pain: Secondary | ICD-10-CM | POA: Diagnosis not present

## 2014-09-23 DIAGNOSIS — M316 Other giant cell arteritis: Secondary | ICD-10-CM | POA: Diagnosis not present

## 2014-09-25 DIAGNOSIS — I509 Heart failure, unspecified: Secondary | ICD-10-CM | POA: Diagnosis not present

## 2014-09-25 DIAGNOSIS — N182 Chronic kidney disease, stage 2 (mild): Secondary | ICD-10-CM | POA: Diagnosis not present

## 2014-09-25 DIAGNOSIS — E782 Mixed hyperlipidemia: Secondary | ICD-10-CM | POA: Diagnosis not present

## 2014-09-25 DIAGNOSIS — F329 Major depressive disorder, single episode, unspecified: Secondary | ICD-10-CM | POA: Diagnosis not present

## 2014-09-25 DIAGNOSIS — I252 Old myocardial infarction: Secondary | ICD-10-CM | POA: Diagnosis not present

## 2014-09-25 DIAGNOSIS — J9811 Atelectasis: Secondary | ICD-10-CM | POA: Diagnosis not present

## 2014-09-25 DIAGNOSIS — Z951 Presence of aortocoronary bypass graft: Secondary | ICD-10-CM | POA: Diagnosis not present

## 2014-09-25 DIAGNOSIS — R600 Localized edema: Secondary | ICD-10-CM | POA: Diagnosis not present

## 2014-09-25 DIAGNOSIS — Z794 Long term (current) use of insulin: Secondary | ICD-10-CM | POA: Diagnosis not present

## 2014-09-25 DIAGNOSIS — R0600 Dyspnea, unspecified: Secondary | ICD-10-CM | POA: Diagnosis not present

## 2014-09-25 DIAGNOSIS — R0602 Shortness of breath: Secondary | ICD-10-CM | POA: Diagnosis not present

## 2014-09-25 DIAGNOSIS — E119 Type 2 diabetes mellitus without complications: Secondary | ICD-10-CM | POA: Diagnosis not present

## 2014-09-25 DIAGNOSIS — E785 Hyperlipidemia, unspecified: Secondary | ICD-10-CM | POA: Diagnosis not present

## 2014-09-25 DIAGNOSIS — D649 Anemia, unspecified: Secondary | ICD-10-CM | POA: Diagnosis not present

## 2014-09-25 DIAGNOSIS — I209 Angina pectoris, unspecified: Secondary | ICD-10-CM | POA: Diagnosis not present

## 2014-09-25 DIAGNOSIS — I214 Non-ST elevation (NSTEMI) myocardial infarction: Secondary | ICD-10-CM | POA: Diagnosis not present

## 2014-09-25 DIAGNOSIS — I5023 Acute on chronic systolic (congestive) heart failure: Secondary | ICD-10-CM | POA: Diagnosis not present

## 2014-09-25 DIAGNOSIS — I10 Essential (primary) hypertension: Secondary | ICD-10-CM | POA: Diagnosis not present

## 2014-09-25 DIAGNOSIS — R0682 Tachypnea, not elsewhere classified: Secondary | ICD-10-CM | POA: Diagnosis not present

## 2014-09-25 DIAGNOSIS — I517 Cardiomegaly: Secondary | ICD-10-CM | POA: Diagnosis not present

## 2014-09-25 DIAGNOSIS — I6523 Occlusion and stenosis of bilateral carotid arteries: Secondary | ICD-10-CM | POA: Diagnosis not present

## 2014-09-25 DIAGNOSIS — I472 Ventricular tachycardia: Secondary | ICD-10-CM | POA: Diagnosis not present

## 2014-09-25 DIAGNOSIS — I25119 Atherosclerotic heart disease of native coronary artery with unspecified angina pectoris: Secondary | ICD-10-CM | POA: Diagnosis not present

## 2014-09-25 DIAGNOSIS — J449 Chronic obstructive pulmonary disease, unspecified: Secondary | ICD-10-CM | POA: Diagnosis not present

## 2014-09-25 DIAGNOSIS — Z6835 Body mass index (BMI) 35.0-35.9, adult: Secondary | ICD-10-CM | POA: Diagnosis not present

## 2014-09-25 DIAGNOSIS — E669 Obesity, unspecified: Secondary | ICD-10-CM | POA: Diagnosis not present

## 2014-09-25 DIAGNOSIS — J810 Acute pulmonary edema: Secondary | ICD-10-CM | POA: Diagnosis not present

## 2014-09-25 DIAGNOSIS — Z955 Presence of coronary angioplasty implant and graft: Secondary | ICD-10-CM | POA: Diagnosis not present

## 2014-09-25 DIAGNOSIS — K219 Gastro-esophageal reflux disease without esophagitis: Secondary | ICD-10-CM | POA: Diagnosis not present

## 2014-09-25 DIAGNOSIS — E1122 Type 2 diabetes mellitus with diabetic chronic kidney disease: Secondary | ICD-10-CM | POA: Diagnosis not present

## 2014-09-25 DIAGNOSIS — E876 Hypokalemia: Secondary | ICD-10-CM | POA: Diagnosis not present

## 2014-09-25 DIAGNOSIS — Z87891 Personal history of nicotine dependence: Secondary | ICD-10-CM | POA: Diagnosis not present

## 2014-09-25 DIAGNOSIS — I2510 Atherosclerotic heart disease of native coronary artery without angina pectoris: Secondary | ICD-10-CM | POA: Diagnosis not present

## 2014-10-16 DIAGNOSIS — E559 Vitamin D deficiency, unspecified: Secondary | ICD-10-CM | POA: Diagnosis not present

## 2014-10-16 DIAGNOSIS — N200 Calculus of kidney: Secondary | ICD-10-CM | POA: Diagnosis not present

## 2014-10-16 DIAGNOSIS — E119 Type 2 diabetes mellitus without complications: Secondary | ICD-10-CM | POA: Diagnosis not present

## 2014-10-16 DIAGNOSIS — I213 ST elevation (STEMI) myocardial infarction of unspecified site: Secondary | ICD-10-CM | POA: Diagnosis not present

## 2014-10-16 DIAGNOSIS — G542 Cervical root disorders, not elsewhere classified: Secondary | ICD-10-CM | POA: Diagnosis not present

## 2014-10-16 DIAGNOSIS — R5383 Other fatigue: Secondary | ICD-10-CM | POA: Diagnosis not present

## 2014-10-16 DIAGNOSIS — Z955 Presence of coronary angioplasty implant and graft: Secondary | ICD-10-CM | POA: Diagnosis not present

## 2014-10-16 DIAGNOSIS — I517 Cardiomegaly: Secondary | ICD-10-CM | POA: Diagnosis not present

## 2014-10-16 DIAGNOSIS — I10 Essential (primary) hypertension: Secondary | ICD-10-CM | POA: Diagnosis not present

## 2014-10-20 DIAGNOSIS — I25119 Atherosclerotic heart disease of native coronary artery with unspecified angina pectoris: Secondary | ICD-10-CM | POA: Diagnosis not present

## 2014-10-20 DIAGNOSIS — I509 Heart failure, unspecified: Secondary | ICD-10-CM | POA: Diagnosis not present

## 2014-10-20 DIAGNOSIS — I214 Non-ST elevation (NSTEMI) myocardial infarction: Secondary | ICD-10-CM | POA: Diagnosis not present

## 2014-10-20 DIAGNOSIS — I10 Essential (primary) hypertension: Secondary | ICD-10-CM | POA: Diagnosis not present

## 2015-01-15 DIAGNOSIS — E7800 Pure hypercholesterolemia, unspecified: Secondary | ICD-10-CM | POA: Diagnosis not present

## 2015-01-15 DIAGNOSIS — Z23 Encounter for immunization: Secondary | ICD-10-CM | POA: Diagnosis not present

## 2015-01-15 DIAGNOSIS — E559 Vitamin D deficiency, unspecified: Secondary | ICD-10-CM | POA: Diagnosis not present

## 2015-01-15 DIAGNOSIS — I10 Essential (primary) hypertension: Secondary | ICD-10-CM | POA: Diagnosis not present

## 2015-01-15 DIAGNOSIS — Z79899 Other long term (current) drug therapy: Secondary | ICD-10-CM | POA: Diagnosis not present

## 2015-01-15 DIAGNOSIS — E119 Type 2 diabetes mellitus without complications: Secondary | ICD-10-CM | POA: Diagnosis not present

## 2015-01-15 DIAGNOSIS — E669 Obesity, unspecified: Secondary | ICD-10-CM | POA: Diagnosis not present

## 2015-01-15 DIAGNOSIS — I4891 Unspecified atrial fibrillation: Secondary | ICD-10-CM | POA: Diagnosis not present

## 2015-01-15 DIAGNOSIS — J449 Chronic obstructive pulmonary disease, unspecified: Secondary | ICD-10-CM | POA: Diagnosis not present

## 2015-01-15 DIAGNOSIS — R5383 Other fatigue: Secondary | ICD-10-CM | POA: Diagnosis not present

## 2015-01-15 DIAGNOSIS — D649 Anemia, unspecified: Secondary | ICD-10-CM | POA: Diagnosis not present

## 2015-01-15 DIAGNOSIS — K219 Gastro-esophageal reflux disease without esophagitis: Secondary | ICD-10-CM | POA: Diagnosis not present

## 2015-01-20 DIAGNOSIS — M316 Other giant cell arteritis: Secondary | ICD-10-CM | POA: Diagnosis not present

## 2015-01-20 DIAGNOSIS — I5022 Chronic systolic (congestive) heart failure: Secondary | ICD-10-CM | POA: Diagnosis not present

## 2015-01-20 DIAGNOSIS — I214 Non-ST elevation (NSTEMI) myocardial infarction: Secondary | ICD-10-CM | POA: Diagnosis not present

## 2015-01-20 DIAGNOSIS — I25119 Atherosclerotic heart disease of native coronary artery with unspecified angina pectoris: Secondary | ICD-10-CM | POA: Diagnosis not present

## 2015-01-20 DIAGNOSIS — I10 Essential (primary) hypertension: Secondary | ICD-10-CM | POA: Diagnosis not present

## 2015-03-26 ENCOUNTER — Other Ambulatory Visit
Admission: RE | Admit: 2015-03-26 | Discharge: 2015-03-26 | Disposition: A | Discharge status: Home / Self Care | Payer: Medicare Other | Source: Ambulatory Visit | Attending: Ophthalmology | Admitting: Ophthalmology

## 2015-03-26 DIAGNOSIS — H47012 Ischemic optic neuropathy, left eye: Secondary | ICD-10-CM | POA: Insufficient documentation

## 2015-03-26 DIAGNOSIS — E119 Type 2 diabetes mellitus without complications: Secondary | ICD-10-CM | POA: Diagnosis not present

## 2015-03-26 DIAGNOSIS — M316 Other giant cell arteritis: Secondary | ICD-10-CM | POA: Insufficient documentation

## 2015-03-26 LAB — CBC WITH DIFFERENTIAL/PLATELET
BASOS ABS: 0 10*3/uL (ref 0–0.1)
Basophils Relative: 0 %
Eosinophils Absolute: 0.5 10*3/uL (ref 0–0.7)
Eosinophils Relative: 6 %
HCT: 35 % (ref 35.0–47.0)
Hemoglobin: 11.2 g/dL — ABNORMAL LOW (ref 12.0–16.0)
Lymphocytes Relative: 20 %
Lymphs Abs: 1.9 10*3/uL (ref 1.0–3.6)
MCH: 26.4 pg (ref 26.0–34.0)
MCHC: 32 g/dL (ref 32.0–36.0)
MCV: 82.5 fL (ref 80.0–100.0)
Monocytes Absolute: 0.6 10*3/uL (ref 0.2–0.9)
Monocytes Relative: 7 %
Neutro Abs: 6.2 10*3/uL (ref 1.4–6.5)
Neutrophils Relative %: 67 %
Platelets: 288 10*3/uL (ref 150–440)
RBC: 4.24 MIL/uL (ref 3.80–5.20)
RDW: 17.8 % — ABNORMAL HIGH (ref 11.5–14.5)
WBC: 9.2 10*3/uL (ref 3.6–11.0)

## 2015-03-26 LAB — SEDIMENTATION RATE: SED RATE: 35 mm/h — AB (ref 0–30)

## 2015-03-26 LAB — C-REACTIVE PROTEIN: CRP: <0.5 mg/dL (ref <1.0)

## 2015-04-17 DIAGNOSIS — E7800 Pure hypercholesterolemia, unspecified: Secondary | ICD-10-CM | POA: Diagnosis not present

## 2015-04-17 DIAGNOSIS — E559 Vitamin D deficiency, unspecified: Secondary | ICD-10-CM | POA: Diagnosis not present

## 2015-04-17 DIAGNOSIS — R5383 Other fatigue: Secondary | ICD-10-CM | POA: Diagnosis not present

## 2015-04-17 DIAGNOSIS — E119 Type 2 diabetes mellitus without complications: Secondary | ICD-10-CM | POA: Diagnosis not present

## 2015-04-17 DIAGNOSIS — D649 Anemia, unspecified: Secondary | ICD-10-CM | POA: Diagnosis not present

## 2015-04-17 DIAGNOSIS — J449 Chronic obstructive pulmonary disease, unspecified: Secondary | ICD-10-CM | POA: Diagnosis not present

## 2015-04-17 DIAGNOSIS — I10 Essential (primary) hypertension: Secondary | ICD-10-CM | POA: Diagnosis not present

## 2015-05-20 DIAGNOSIS — I10 Essential (primary) hypertension: Secondary | ICD-10-CM | POA: Diagnosis not present

## 2015-05-20 DIAGNOSIS — I214 Non-ST elevation (NSTEMI) myocardial infarction: Secondary | ICD-10-CM | POA: Diagnosis not present

## 2015-05-20 DIAGNOSIS — I2510 Atherosclerotic heart disease of native coronary artery without angina pectoris: Secondary | ICD-10-CM | POA: Diagnosis not present

## 2015-05-20 DIAGNOSIS — I5020 Unspecified systolic (congestive) heart failure: Secondary | ICD-10-CM | POA: Diagnosis not present

## 2015-05-20 DIAGNOSIS — I5022 Chronic systolic (congestive) heart failure: Secondary | ICD-10-CM | POA: Diagnosis not present

## 2015-06-03 DIAGNOSIS — I214 Non-ST elevation (NSTEMI) myocardial infarction: Secondary | ICD-10-CM | POA: Diagnosis not present

## 2015-06-03 DIAGNOSIS — I2510 Atherosclerotic heart disease of native coronary artery without angina pectoris: Secondary | ICD-10-CM | POA: Diagnosis not present

## 2015-06-03 DIAGNOSIS — I5022 Chronic systolic (congestive) heart failure: Secondary | ICD-10-CM | POA: Diagnosis not present

## 2015-06-03 DIAGNOSIS — I5020 Unspecified systolic (congestive) heart failure: Secondary | ICD-10-CM | POA: Diagnosis not present

## 2015-06-12 DIAGNOSIS — I214 Non-ST elevation (NSTEMI) myocardial infarction: Secondary | ICD-10-CM | POA: Diagnosis not present

## 2015-06-12 DIAGNOSIS — I5022 Chronic systolic (congestive) heart failure: Secondary | ICD-10-CM | POA: Diagnosis not present

## 2015-06-12 DIAGNOSIS — I2510 Atherosclerotic heart disease of native coronary artery without angina pectoris: Secondary | ICD-10-CM | POA: Diagnosis not present

## 2015-06-12 DIAGNOSIS — I10 Essential (primary) hypertension: Secondary | ICD-10-CM | POA: Diagnosis not present

## 2015-06-12 DIAGNOSIS — M316 Other giant cell arteritis: Secondary | ICD-10-CM | POA: Diagnosis not present

## 2018-08-20 DIAGNOSIS — G510 Bell's palsy: Secondary | ICD-10-CM

## 2018-08-20 HISTORY — DX: Bell's palsy: G51.0

## 2019-09-11 ENCOUNTER — Encounter: Payer: Self-pay | Admitting: Ophthalmology

## 2019-09-11 ENCOUNTER — Other Ambulatory Visit: Payer: Self-pay

## 2019-09-12 ENCOUNTER — Other Ambulatory Visit
Admission: RE | Admit: 2019-09-12 | Discharge: 2019-09-12 | Disposition: A | Discharge status: Home / Self Care | Payer: Medicare Other | Source: Ambulatory Visit | Attending: Ophthalmology | Admitting: Ophthalmology

## 2019-09-12 DIAGNOSIS — Z20822 Contact with and (suspected) exposure to covid-19: Secondary | ICD-10-CM | POA: Insufficient documentation

## 2019-09-12 DIAGNOSIS — Z01812 Encounter for preprocedural laboratory examination: Secondary | ICD-10-CM | POA: Diagnosis present

## 2019-09-12 LAB — SARS CORONAVIRUS 2 (TAT 6-24 HRS): SARS Coronavirus 2: NEGATIVE

## 2019-09-12 NOTE — Discharge Instructions (Signed)
General Anesthesia, Adult, Care After This sheet gives you information about how to care for yourself after your procedure. Your health care provider may also give you more specific instructions. If you have problems or questions, contact your health care provider. What can I expect after the procedure? After the procedure, the following side effects are common:  Pain or discomfort at the IV site.  Nausea.  Vomiting.  Sore throat.  Trouble concentrating.  Feeling cold or chills.  Weak or tired.  Sleepiness and fatigue.  Soreness and body aches. These side effects can affect parts of the body that were not involved in surgery. Follow these instructions at home:  For at least 24 hours after the procedure:  Have a responsible adult stay with you. It is important to have someone help care for you until you are awake and alert.  Rest as needed.  Do not: ? Participate in activities in which you could fall or become injured. ? Drive. ? Use heavy machinery. ? Drink alcohol. ? Take sleeping pills or medicines that cause drowsiness. ? Make important decisions or sign legal documents. ? Take care of children on your own. Eating and drinking  Follow any instructions from your health care provider about eating or drinking restrictions.  When you feel hungry, start by eating small amounts of foods that are soft and easy to digest (bland), such as toast. Gradually return to your regular diet.  Drink enough fluid to keep your urine pale yellow.  If you vomit, rehydrate by drinking water, juice, or clear broth. General instructions  If you have sleep apnea, surgery and certain medicines can increase your risk for breathing problems. Follow instructions from your health care provider about wearing your sleep device: ? Anytime you are sleeping, including during daytime naps. ? While taking prescription pain medicines, sleeping medicines, or medicines that make you drowsy.  Return to  your normal activities as told by your health care provider. Ask your health care provider what activities are safe for you.  Take over-the-counter and prescription medicines only as told by your health care provider.  If you smoke, do not smoke without supervision.  Keep all follow-up visits as told by your health care provider. This is important. Contact a health care provider if:  You have nausea or vomiting that does not get better with medicine.  You cannot eat or drink without vomiting.  You have pain that does not get better with medicine.  You are unable to pass urine.  You develop a skin rash.  You have a fever.  You have redness around your IV site that gets worse. Get help right away if:  You have difficulty breathing.  You have chest pain.  You have blood in your urine or stool, or you vomit blood. Summary  After the procedure, it is common to have a sore throat or nausea. It is also common to feel tired.  Have a responsible adult stay with you for the first 24 hours after general anesthesia. It is important to have someone help care for you until you are awake and alert.  When you feel hungry, start by eating small amounts of foods that are soft and easy to digest (bland), such as toast. Gradually return to your regular diet.  Drink enough fluid to keep your urine pale yellow.  Return to your normal activities as told by your health care provider. Ask your health care provider what activities are safe for you. This information is not   intended to replace advice given to you by your health care provider. Make sure you discuss any questions you have with your health care provider. Document Revised: 03/10/2017 Document Reviewed: 10/21/2016 Elsevier Patient Education  2020 Elsevier Inc.  Cataract Surgery, Care After This sheet gives you information about how to care for yourself after your procedure. Your health care provider may also give you more specific  instructions. If you have problems or questions, contact your health care provider. What can I expect after the procedure? After the procedure, it is common to have:  Itching.  Discomfort.  Fluid discharge.  Sensitivity to light and to touch.  Bruising in or around the eye.  Mild blurred vision. Follow these instructions at home: Eye care   Do not touch or rub your eyes.  Protect your eyes as told by your health care provider. You may be told to wear a protective eye shield or sunglasses.  Do not put a contact lens into the affected eye or eyes until your health care provider approves.  Keep the area around your eye clean and dry: ? Avoid swimming. ? Do not allow water to hit you directly in the face while showering. ? Keep soap and shampoo out of your eyes.  Check your eye every day for signs of infection. Watch for: ? Redness, swelling, or pain. ? Fluid, blood, or pus. ? Warmth. ? A bad smell. ? Vision that is getting worse. ? Sensitivity that is getting worse. Activity  Do not drive for 24 hours if you were given a sedative during your procedure.  Avoid strenuous activities, such as playing contact sports, for as long as told by your health care provider.  Do not drive or use heavy machinery until your health care provider approves.  Do not bend or lift heavy objects. Bending increases pressure in the eye. You can walk, climb stairs, and do light household chores.  Ask your health care provider when you can return to work. If you work in a dusty environment, you may be advised to wear protective eyewear for a period of time. General instructions  Take or apply over-the-counter and prescription medicines only as told by your health care provider. This includes eye drops.  Keep all follow-up visits as told by your health care provider. This is important. Contact a health care provider if:  You have increased bruising around your eye.  You have pain that is  not helped with medicine.  You have a fever.  You have redness, swelling, or pain in your eye.  You have fluid, blood, or pus coming from your incision.  Your vision gets worse.  Your sensitivity to light gets worse. Get help right away if:  You have sudden loss of vision.  You see flashes of light or spots (floaters).  You have severe eye pain.  You develop nausea or vomiting. Summary  After your procedure, it is common to have itching, discomfort, bruising, fluid discharge, or sensitivity to light.  Follow instructions from your health care provider about caring for your eye after the procedure.  Do not rub your eye after the procedure. You may need to wear eye protection or sunglasses. Do not wear contact lenses. Keep the area around your eye clean and dry.  Avoid activities that require a lot of effort. These include playing sports and lifting heavy objects.  Contact a health care provider if you have increased bruising, pain that does not go away, or a fever. Get help right   away if you suddenly lose your vision, see flashes of light or spots, or have severe pain in the eye. This information is not intended to replace advice given to you by your health care provider. Make sure you discuss any questions you have with your health care provider. Document Revised: 01/01/2019 Document Reviewed: 09/04/2017 Elsevier Patient Education  2020 Elsevier Inc.  

## 2019-09-16 ENCOUNTER — Encounter: Payer: Self-pay | Admitting: Ophthalmology

## 2019-09-16 ENCOUNTER — Encounter
Admission: RE | Disposition: A | Discharge status: Home / Self Care | Payer: Self-pay | Source: Home / Self Care | Attending: Ophthalmology

## 2019-09-16 ENCOUNTER — Ambulatory Visit: Payer: Medicare Other | Admitting: Anesthesiology

## 2019-09-16 ENCOUNTER — Ambulatory Visit
Admission: RE | Admit: 2019-09-16 | Discharge: 2019-09-16 | Disposition: A | Discharge status: Home / Self Care | Payer: Medicare Other | Attending: Ophthalmology | Admitting: Ophthalmology

## 2019-09-16 ENCOUNTER — Other Ambulatory Visit: Payer: Self-pay

## 2019-09-16 DIAGNOSIS — I252 Old myocardial infarction: Secondary | ICD-10-CM | POA: Insufficient documentation

## 2019-09-16 DIAGNOSIS — E7800 Pure hypercholesterolemia, unspecified: Secondary | ICD-10-CM | POA: Insufficient documentation

## 2019-09-16 DIAGNOSIS — E1136 Type 2 diabetes mellitus with diabetic cataract: Secondary | ICD-10-CM | POA: Insufficient documentation

## 2019-09-16 DIAGNOSIS — E1122 Type 2 diabetes mellitus with diabetic chronic kidney disease: Secondary | ICD-10-CM | POA: Diagnosis not present

## 2019-09-16 DIAGNOSIS — E1140 Type 2 diabetes mellitus with diabetic neuropathy, unspecified: Secondary | ICD-10-CM | POA: Insufficient documentation

## 2019-09-16 DIAGNOSIS — H2511 Age-related nuclear cataract, right eye: Secondary | ICD-10-CM | POA: Insufficient documentation

## 2019-09-16 DIAGNOSIS — Z79890 Hormone replacement therapy (postmenopausal): Secondary | ICD-10-CM | POA: Insufficient documentation

## 2019-09-16 DIAGNOSIS — K219 Gastro-esophageal reflux disease without esophagitis: Secondary | ICD-10-CM | POA: Diagnosis not present

## 2019-09-16 DIAGNOSIS — F419 Anxiety disorder, unspecified: Secondary | ICD-10-CM | POA: Insufficient documentation

## 2019-09-16 DIAGNOSIS — Z79891 Long term (current) use of opiate analgesic: Secondary | ICD-10-CM | POA: Insufficient documentation

## 2019-09-16 DIAGNOSIS — Z882 Allergy status to sulfonamides status: Secondary | ICD-10-CM | POA: Insufficient documentation

## 2019-09-16 DIAGNOSIS — Z87891 Personal history of nicotine dependence: Secondary | ICD-10-CM | POA: Insufficient documentation

## 2019-09-16 DIAGNOSIS — Z79899 Other long term (current) drug therapy: Secondary | ICD-10-CM | POA: Insufficient documentation

## 2019-09-16 DIAGNOSIS — I130 Hypertensive heart and chronic kidney disease with heart failure and stage 1 through stage 4 chronic kidney disease, or unspecified chronic kidney disease: Secondary | ICD-10-CM | POA: Insufficient documentation

## 2019-09-16 DIAGNOSIS — I509 Heart failure, unspecified: Secondary | ICD-10-CM | POA: Diagnosis not present

## 2019-09-16 DIAGNOSIS — Z794 Long term (current) use of insulin: Secondary | ICD-10-CM | POA: Diagnosis not present

## 2019-09-16 DIAGNOSIS — Z7902 Long term (current) use of antithrombotics/antiplatelets: Secondary | ICD-10-CM | POA: Diagnosis not present

## 2019-09-16 DIAGNOSIS — Z791 Long term (current) use of non-steroidal anti-inflammatories (NSAID): Secondary | ICD-10-CM | POA: Diagnosis not present

## 2019-09-16 DIAGNOSIS — N183 Chronic kidney disease, stage 3 unspecified: Secondary | ICD-10-CM | POA: Insufficient documentation

## 2019-09-16 DIAGNOSIS — F329 Major depressive disorder, single episode, unspecified: Secondary | ICD-10-CM | POA: Insufficient documentation

## 2019-09-16 DIAGNOSIS — Z955 Presence of coronary angioplasty implant and graft: Secondary | ICD-10-CM | POA: Insufficient documentation

## 2019-09-16 HISTORY — PX: CATARACT EXTRACTION W/PHACO: SHX586

## 2019-09-16 HISTORY — DX: Age-related osteoporosis without current pathological fracture: M81.0

## 2019-09-16 HISTORY — DX: Polyneuropathy, unspecified: G62.9

## 2019-09-16 HISTORY — DX: Essential (primary) hypertension: I10

## 2019-09-16 HISTORY — DX: Chronic kidney disease, stage 3 unspecified: N18.30

## 2019-09-16 HISTORY — DX: Heart failure, unspecified: I50.9

## 2019-09-16 HISTORY — DX: Gastro-esophageal reflux disease without esophagitis: K21.9

## 2019-09-16 HISTORY — DX: Depression, unspecified: F32.A

## 2019-09-16 HISTORY — DX: Other chronic pain: G89.29

## 2019-09-16 HISTORY — DX: Hyperlipidemia, unspecified: E78.5

## 2019-09-16 HISTORY — DX: Dorsopathy, unspecified: M53.9

## 2019-09-16 HISTORY — DX: Chronic cough: R05.3

## 2019-09-16 HISTORY — DX: Type 2 diabetes mellitus without complications: E11.9

## 2019-09-16 HISTORY — DX: Anxiety disorder, unspecified: F41.9

## 2019-09-16 LAB — GLUCOSE, CAPILLARY
Glucose-Capillary: 106 mg/dL — ABNORMAL HIGH (ref 70–99)
Glucose-Capillary: 114 mg/dL — ABNORMAL HIGH (ref 70–99)

## 2019-09-16 SURGERY — PHACOEMULSIFICATION, CATARACT, WITH IOL INSERTION
Anesthesia: Monitor Anesthesia Care | Site: Eye | Laterality: Right

## 2019-09-16 MED ORDER — TETRACAINE HCL 0.5 % OP SOLN
1.0000 [drp] | OPHTHALMIC | Status: DC | PRN
  Administered 2019-09-16 (×3): 1 [drp] via OPHTHALMIC

## 2019-09-16 MED ORDER — LACTATED RINGERS IV SOLN: INTRAVENOUS | Status: DC

## 2019-09-16 MED ORDER — SODIUM HYALURONATE 23 MG/ML IO SOLN
INTRAOCULAR | Status: DC | PRN
  Administered 2019-09-16: 0.6 mL via INTRAOCULAR

## 2019-09-16 MED ORDER — MIDAZOLAM HCL 2 MG/2ML IJ SOLN
INTRAMUSCULAR | Status: DC | PRN
  Administered 2019-09-16: 1 mg via INTRAVENOUS

## 2019-09-16 MED ORDER — EPINEPHRINE PF 1 MG/ML IJ SOLN
INTRAOCULAR | Status: DC | PRN
  Administered 2019-09-16: 75 mL via OPHTHALMIC

## 2019-09-16 MED ORDER — LIDOCAINE HCL (PF) 2 % IJ SOLN
INTRAOCULAR | Status: DC | PRN
  Administered 2019-09-16: 1 mL via INTRAOCULAR

## 2019-09-16 MED ORDER — ARMC OPHTHALMIC DILATING DROPS
1.0000 "application " | OPHTHALMIC | Status: DC | PRN
  Administered 2019-09-16 (×3): 1 via OPHTHALMIC

## 2019-09-16 MED ORDER — MOXIFLOXACIN HCL 0.5 % OP SOLN
OPHTHALMIC | Status: DC | PRN
  Administered 2019-09-16: 0.2 mL via OPHTHALMIC

## 2019-09-16 MED ORDER — FENTANYL CITRATE (PF) 100 MCG/2ML IJ SOLN
INTRAMUSCULAR | Status: DC | PRN
  Administered 2019-09-16: 50 ug via INTRAVENOUS

## 2019-09-16 MED ORDER — SODIUM HYALURONATE 10 MG/ML IO SOLN
INTRAOCULAR | Status: DC | PRN
  Administered 2019-09-16: 0.55 mL via INTRAOCULAR

## 2019-09-16 SURGICAL SUPPLY — 20 items
CANNULA ANT/CHMB 27G (MISCELLANEOUS) ×2 IMPLANT
CANNULA ANT/CHMB 27GA (MISCELLANEOUS) ×6 IMPLANT
DISSECTOR HYDRO NUCLEUS 50X22 (MISCELLANEOUS) ×3 IMPLANT
GLOVE SURG LX 7.5 STRW (GLOVE) ×2
GLOVE SURG LX STRL 7.5 STRW (GLOVE) ×1 IMPLANT
GLOVE SURG SYN 8.5  E (GLOVE) ×3
GLOVE SURG SYN 8.5 E (GLOVE) ×1 IMPLANT
GLOVE SURG SYN 8.5 PF PI (GLOVE) ×1 IMPLANT
GOWN STRL REUS W/ TWL LRG LVL3 (GOWN DISPOSABLE) ×2 IMPLANT
GOWN STRL REUS W/TWL LRG LVL3 (GOWN DISPOSABLE) ×6
LENS IOL DIOP 18.0 (Intraocular Lens) ×3 IMPLANT
LENS IOL TECNIS MONO 18.0 (Intraocular Lens) IMPLANT
MARKER SKIN DUAL TIP RULER LAB (MISCELLANEOUS) ×3 IMPLANT
PACK DR. KING ARMS (PACKS) ×3 IMPLANT
PACK EYE AFTER SURG (MISCELLANEOUS) ×3 IMPLANT
PACK OPTHALMIC (MISCELLANEOUS) ×3 IMPLANT
SYR 3ML LL SCALE MARK (SYRINGE) ×3 IMPLANT
SYR TB 1ML LUER SLIP (SYRINGE) ×3 IMPLANT
WATER STERILE IRR 250ML POUR (IV SOLUTION) ×3 IMPLANT
WIPE NON LINTING 3.25X3.25 (MISCELLANEOUS) ×3 IMPLANT

## 2019-09-16 NOTE — Transfer of Care (Signed)
Immediate Anesthesia Transfer of Care Note  Patient: Jamie Benitez  Procedure(s) Performed: CATARACT EXTRACTION PHACO AND INTRAOCULAR LENS PLACEMENT (IOC) RIGHT DIABETIC 2.12  00:27.3 (Right Eye)  Patient Location: PACU  Anesthesia Type: MAC  Level of Consciousness: awake, alert  and patient cooperative  Airway and Oxygen Therapy: Patient Spontanous Breathing and Patient connected to supplemental oxygen  Post-op Assessment: Post-op Vital signs reviewed, Patient's Cardiovascular Status Stable, Respiratory Function Stable, Patent Airway and No signs of Nausea or vomiting  Post-op Vital Signs: Reviewed and stable  Complications: No complications documented.

## 2019-09-16 NOTE — H&P (Signed)

## 2019-09-16 NOTE — Anesthesia Preprocedure Evaluation (Signed)
Anesthesia Evaluation  Patient identified by MRN, date of birth, ID band Patient awake    History of Anesthesia Complications Negative for: history of anesthetic complications  Airway Mallampati: III  TM Distance: <3 FB Neck ROM: Full    Dental  (+) Edentulous Upper, Edentulous Lower   Pulmonary former smoker,    Pulmonary exam normal        Cardiovascular Exercise Tolerance: Poor hypertension, Pt. 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), + Cardiac Stents (stents '07 and '16, taking Plavix) and +CHF (2019 echo EF 35%)  Normal cardiovascular exam     Neuro/Psych    GI/Hepatic   Endo/Other  diabetes, Type 2, Insulin Dependent, Oral Hypoglycemic Agents  Renal/GU CRFRenal disease (stage 3 CKD)     Musculoskeletal   Abdominal   Peds  Hematology   Anesthesia Other Findings   Reproductive/Obstetrics                             Anesthesia Physical Anesthesia Plan  ASA: III  Anesthesia Plan: MAC   Post-op Pain Management:    Induction: Intravenous  PONV Risk Score and Plan: 2 and Midazolam, Treatment may vary due to age or medical condition and TIVA  Airway Management Planned: Nasal Cannula and Natural Airway  Additional Equipment: None  Intra-op Plan:   Post-operative Plan:   Informed Consent: I have reviewed the patients History and Physical, chart, labs and discussed the procedure including the risks, benefits and alternatives for the proposed anesthesia with the patient or authorized representative who has indicated his/her understanding and acceptance.       Plan Discussed with: CRNA  Anesthesia Plan Comments:         Anesthesia Quick Evaluation

## 2019-09-16 NOTE — Anesthesia Procedure Notes (Signed)
Procedure Name: [place] Performed by: Silvana Newness, CRNA Pre-anesthesia Checklist: Patient identified, Emergency Drugs available, Suction available, Patient being monitored and Timeout performed Patient Re-evaluated:Patient Re-evaluated prior to induction Oxygen Delivery Method: Nasal cannula

## 2019-09-16 NOTE — Anesthesia Postprocedure Evaluation (Signed)
Anesthesia Post Note  Patient: Jamie Benitez  Procedure(s) Performed: CATARACT EXTRACTION PHACO AND INTRAOCULAR LENS PLACEMENT (IOC) RIGHT DIABETIC 2.12  00:27.3 (Right Eye)     Patient location during evaluation: PACU Anesthesia Type: MAC Level of consciousness: awake and alert Pain management: pain level controlled Vital Signs Assessment: post-procedure vital signs reviewed and stable Respiratory status: spontaneous breathing Cardiovascular status: blood pressure returned to baseline Postop Assessment: no apparent nausea or vomiting, adequate PO intake and no headache Anesthetic complications: no   No complications documented.  Adele Barthel Renley Banwart

## 2019-09-16 NOTE — Op Note (Signed)
OPERATIVE NOTE  Jamie Benitez [id number] 09/16/2019   PREOPERATIVE DIAGNOSIS:  Nuclear sclerotic cataract right eye.  H25.11   POSTOPERATIVE DIAGNOSIS:    Nuclear sclerotic cataract right eye.     PROCEDURE:  Phacoemusification with posterior chamber intraocular lens placement of the right eye   LENS:   Implant Name Type Inv. Item Serial No. Manufacturer Lot No. LRB No. Used Action  LENS IOL DIOP 18.0 - [id number] Intraocular Lens LENS IOL DIOP 18.0 [id number] [employer]  Right 1 Implanted       Procedure(s) with comments: CATARACT EXTRACTION PHACO AND INTRAOCULAR LENS PLACEMENT (IOC) RIGHT DIABETIC 2.12  00:27.3 (Right) - Diabetic - insulin and oral meds  DCB00 +18.0   ULTRASOUND TIME: 0 minutes 27 seconds.  CDE 2.12   SURGEON:  Willey Blade, MD, MPH  ANESTHESIOLOGIST: Anesthesiologist: Page, Wille Celeste, MD CRNA: Michaele Offer, CRNA   ANESTHESIA:  Topical with tetracaine drops augmented with 1% preservative-free intracameral lidocaine.  ESTIMATED BLOOD LOSS: less than 1 mL.   COMPLICATIONS:  None.   DESCRIPTION OF PROCEDURE:  The patient was identified in the holding room and transported to the operating room and placed in the supine position under the operating microscope.  The right eye was identified as the operative eye and it was prepped and draped in the usual sterile ophthalmic fashion.   A 1.0 millimeter clear-corneal paracentesis was made at the 10:30 position. 0.5 ml of preservative-free 1% lidocaine with epinephrine was injected into the anterior chamber.  The anterior chamber was filled with Healon 5 viscoelastic.  A 2.4 millimeter keratome was used to make a near-clear corneal incision at the 8:00 position.  A curvilinear capsulorrhexis was made with a cystotome and capsulorrhexis forceps.  Balanced salt solution was used to hydrodissect and hydrodelineate the nucleus.   Phacoemulsification was then used in stop and chop fashion to remove the lens  nucleus and epinucleus.  The remaining cortex was then removed using the irrigation and aspiration handpiece. Healon was then placed into the capsular bag to distend it for lens placement.  A lens was then injected into the capsular bag.  The remaining viscoelastic was aspirated.   Wounds were hydrated with balanced salt solution.  The anterior chamber was inflated to a physiologic pressure with balanced salt solution.   Intracameral vigamox 0.1 mL undiluted was injected into the eye and a drop placed onto the ocular surface.  No wound leaks were noted.  The patient was taken to the recovery room in stable condition without complications of anesthesia or surgery  Willey Blade 09/16/2019, 7:56 AM

## 2019-09-17 ENCOUNTER — Encounter: Payer: Self-pay | Admitting: Ophthalmology

## 2020-12-08 ENCOUNTER — Observation Stay: Payer: Medicare Other

## 2020-12-09 ENCOUNTER — Emergency Department: Payer: Medicare Other

## 2020-12-09 ENCOUNTER — Observation Stay: Payer: Medicare Other

## 2020-12-09 ENCOUNTER — Inpatient Hospital Stay
Admission: EM | Admit: 2020-12-09 | Discharge: 2020-12-19 | DRG: 682 | Disposition: A | Discharge status: Hospice | Payer: Medicare Other | Attending: Hospitalist | Admitting: Hospitalist

## 2020-12-09 ENCOUNTER — Encounter: Payer: Self-pay | Admitting: Internal Medicine

## 2020-12-09 ENCOUNTER — Other Ambulatory Visit: Payer: Self-pay

## 2020-12-09 DIAGNOSIS — G4089 Other seizures: Secondary | ICD-10-CM | POA: Diagnosis not present

## 2020-12-09 DIAGNOSIS — E871 Hypo-osmolality and hyponatremia: Secondary | ICD-10-CM | POA: Diagnosis present

## 2020-12-09 DIAGNOSIS — E1140 Type 2 diabetes mellitus with diabetic neuropathy, unspecified: Secondary | ICD-10-CM | POA: Diagnosis present

## 2020-12-09 DIAGNOSIS — N179 Acute kidney failure, unspecified: Secondary | ICD-10-CM

## 2020-12-09 DIAGNOSIS — R531 Weakness: Secondary | ICD-10-CM

## 2020-12-09 DIAGNOSIS — M316 Other giant cell arteritis: Secondary | ICD-10-CM | POA: Diagnosis present

## 2020-12-09 DIAGNOSIS — M810 Age-related osteoporosis without current pathological fracture: Secondary | ICD-10-CM | POA: Diagnosis present

## 2020-12-09 DIAGNOSIS — R778 Other specified abnormalities of plasma proteins: Secondary | ICD-10-CM

## 2020-12-09 DIAGNOSIS — I130 Hypertensive heart and chronic kidney disease with heart failure and stage 1 through stage 4 chronic kidney disease, or unspecified chronic kidney disease: Secondary | ICD-10-CM | POA: Diagnosis present

## 2020-12-09 DIAGNOSIS — I639 Cerebral infarction, unspecified: Secondary | ICD-10-CM

## 2020-12-09 DIAGNOSIS — J9601 Acute respiratory failure with hypoxia: Secondary | ICD-10-CM

## 2020-12-09 DIAGNOSIS — K838 Other specified diseases of biliary tract: Secondary | ICD-10-CM | POA: Diagnosis present

## 2020-12-09 DIAGNOSIS — I214 Non-ST elevation (NSTEMI) myocardial infarction: Secondary | ICD-10-CM | POA: Diagnosis present

## 2020-12-09 DIAGNOSIS — E872 Acidosis, unspecified: Secondary | ICD-10-CM | POA: Diagnosis present

## 2020-12-09 DIAGNOSIS — I248 Other forms of acute ischemic heart disease: Secondary | ICD-10-CM | POA: Diagnosis present

## 2020-12-09 DIAGNOSIS — D631 Anemia in chronic kidney disease: Secondary | ICD-10-CM | POA: Diagnosis present

## 2020-12-09 DIAGNOSIS — B9561 Methicillin susceptible Staphylococcus aureus infection as the cause of diseases classified elsewhere: Secondary | ICD-10-CM | POA: Diagnosis present

## 2020-12-09 DIAGNOSIS — I10 Essential (primary) hypertension: Secondary | ICD-10-CM | POA: Diagnosis present

## 2020-12-09 DIAGNOSIS — F418 Other specified anxiety disorders: Secondary | ICD-10-CM | POA: Diagnosis present

## 2020-12-09 DIAGNOSIS — F419 Anxiety disorder, unspecified: Secondary | ICD-10-CM | POA: Diagnosis present

## 2020-12-09 DIAGNOSIS — G8929 Other chronic pain: Secondary | ICD-10-CM | POA: Diagnosis present

## 2020-12-09 DIAGNOSIS — E860 Dehydration: Secondary | ICD-10-CM | POA: Diagnosis present

## 2020-12-09 DIAGNOSIS — I6389 Other cerebral infarction: Secondary | ICD-10-CM | POA: Diagnosis not present

## 2020-12-09 DIAGNOSIS — Z66 Do not resuscitate: Secondary | ICD-10-CM

## 2020-12-09 DIAGNOSIS — N170 Acute kidney failure with tubular necrosis: Principal | ICD-10-CM | POA: Diagnosis present

## 2020-12-09 DIAGNOSIS — Z452 Encounter for adjustment and management of vascular access device: Secondary | ICD-10-CM

## 2020-12-09 DIAGNOSIS — Z794 Long term (current) use of insulin: Secondary | ICD-10-CM

## 2020-12-09 DIAGNOSIS — D72829 Elevated white blood cell count, unspecified: Secondary | ICD-10-CM | POA: Diagnosis present

## 2020-12-09 DIAGNOSIS — Z90710 Acquired absence of both cervix and uterus: Secondary | ICD-10-CM

## 2020-12-09 DIAGNOSIS — I255 Ischemic cardiomyopathy: Secondary | ICD-10-CM | POA: Diagnosis present

## 2020-12-09 DIAGNOSIS — I4510 Unspecified right bundle-branch block: Secondary | ICD-10-CM | POA: Diagnosis present

## 2020-12-09 DIAGNOSIS — I509 Heart failure, unspecified: Secondary | ICD-10-CM

## 2020-12-09 DIAGNOSIS — R4182 Altered mental status, unspecified: Secondary | ICD-10-CM | POA: Diagnosis not present

## 2020-12-09 DIAGNOSIS — Z7902 Long term (current) use of antithrombotics/antiplatelets: Secondary | ICD-10-CM

## 2020-12-09 DIAGNOSIS — F32A Depression, unspecified: Secondary | ICD-10-CM | POA: Diagnosis present

## 2020-12-09 DIAGNOSIS — E1122 Type 2 diabetes mellitus with diabetic chronic kidney disease: Secondary | ICD-10-CM | POA: Diagnosis present

## 2020-12-09 DIAGNOSIS — I5022 Chronic systolic (congestive) heart failure: Secondary | ICD-10-CM | POA: Diagnosis present

## 2020-12-09 DIAGNOSIS — Z791 Long term (current) use of non-steroidal anti-inflammatories (NSAID): Secondary | ICD-10-CM

## 2020-12-09 DIAGNOSIS — E1169 Type 2 diabetes mellitus with other specified complication: Secondary | ICD-10-CM | POA: Diagnosis present

## 2020-12-09 DIAGNOSIS — I252 Old myocardial infarction: Secondary | ICD-10-CM

## 2020-12-09 DIAGNOSIS — Z6841 Body Mass Index (BMI) 40.0 and over, adult: Secondary | ICD-10-CM

## 2020-12-09 DIAGNOSIS — R7989 Other specified abnormal findings of blood chemistry: Secondary | ICD-10-CM

## 2020-12-09 DIAGNOSIS — G9341 Metabolic encephalopathy: Secondary | ICD-10-CM | POA: Diagnosis present

## 2020-12-09 DIAGNOSIS — I4891 Unspecified atrial fibrillation: Secondary | ICD-10-CM | POA: Diagnosis present

## 2020-12-09 DIAGNOSIS — N281 Cyst of kidney, acquired: Secondary | ICD-10-CM | POA: Diagnosis present

## 2020-12-09 DIAGNOSIS — I9589 Other hypotension: Secondary | ICD-10-CM | POA: Diagnosis present

## 2020-12-09 DIAGNOSIS — Z20822 Contact with and (suspected) exposure to covid-19: Secondary | ICD-10-CM | POA: Diagnosis present

## 2020-12-09 DIAGNOSIS — E876 Hypokalemia: Secondary | ICD-10-CM | POA: Diagnosis present

## 2020-12-09 DIAGNOSIS — M549 Dorsalgia, unspecified: Secondary | ICD-10-CM | POA: Diagnosis present

## 2020-12-09 DIAGNOSIS — R7881 Bacteremia: Secondary | ICD-10-CM | POA: Diagnosis present

## 2020-12-09 DIAGNOSIS — R52 Pain, unspecified: Secondary | ICD-10-CM

## 2020-12-09 DIAGNOSIS — E669 Obesity, unspecified: Secondary | ICD-10-CM | POA: Diagnosis present

## 2020-12-09 DIAGNOSIS — R0902 Hypoxemia: Secondary | ICD-10-CM | POA: Diagnosis present

## 2020-12-09 DIAGNOSIS — I2510 Atherosclerotic heart disease of native coronary artery without angina pectoris: Secondary | ICD-10-CM | POA: Diagnosis present

## 2020-12-09 DIAGNOSIS — Z87891 Personal history of nicotine dependence: Secondary | ICD-10-CM

## 2020-12-09 DIAGNOSIS — K219 Gastro-esophageal reflux disease without esophagitis: Secondary | ICD-10-CM | POA: Diagnosis present

## 2020-12-09 DIAGNOSIS — Z833 Family history of diabetes mellitus: Secondary | ICD-10-CM

## 2020-12-09 DIAGNOSIS — L8990 Pressure ulcer of unspecified site, unspecified stage: Secondary | ICD-10-CM | POA: Diagnosis present

## 2020-12-09 DIAGNOSIS — Z955 Presence of coronary angioplasty implant and graft: Secondary | ICD-10-CM

## 2020-12-09 DIAGNOSIS — E785 Hyperlipidemia, unspecified: Secondary | ICD-10-CM | POA: Diagnosis present

## 2020-12-09 DIAGNOSIS — Z7984 Long term (current) use of oral hypoglycemic drugs: Secondary | ICD-10-CM

## 2020-12-09 DIAGNOSIS — N1831 Chronic kidney disease, stage 3a: Secondary | ICD-10-CM | POA: Diagnosis present

## 2020-12-09 DIAGNOSIS — Z515 Encounter for palliative care: Secondary | ICD-10-CM

## 2020-12-09 DIAGNOSIS — E1129 Type 2 diabetes mellitus with other diabetic kidney complication: Secondary | ICD-10-CM | POA: Diagnosis present

## 2020-12-09 DIAGNOSIS — Z79899 Other long term (current) drug therapy: Secondary | ICD-10-CM

## 2020-12-09 LAB — MAGNESIUM: Magnesium: 1.8 mg/dL (ref 1.7–2.4)

## 2020-12-09 LAB — CBG MONITORING, ED
Glucose-Capillary: 152 mg/dL — ABNORMAL HIGH (ref 70–99)
Glucose-Capillary: 137 mg/dL — ABNORMAL HIGH (ref 70–99)
Glucose-Capillary: 150 mg/dL — ABNORMAL HIGH (ref 70–99)
Glucose-Capillary: 138 mg/dL — ABNORMAL HIGH (ref 70–99)

## 2020-12-09 LAB — BLOOD CULTURE ID PANEL (REFLEXED) - BCID2

## 2020-12-09 LAB — COMPREHENSIVE METABOLIC PANEL
ALT: 24 U/L (ref 0–44)
AST: 37 U/L (ref 15–41)
Albumin: 3.5 g/dL (ref 3.5–5.0)
Alkaline Phosphatase: 85 U/L (ref 38–126)
Anion gap: 12 (ref 5–15)
BUN: 35 mg/dL — ABNORMAL HIGH (ref 8–23)
CO2: 22 mmol/L (ref 22–32)
Calcium: 8.5 mg/dL — ABNORMAL LOW (ref 8.9–10.3)
Chloride: 100 mmol/L (ref 98–111)
Creatinine, Ser: 2.02 mg/dL — ABNORMAL HIGH (ref 0.44–1.00)
GFR, Estimated: 26 mL/min — ABNORMAL LOW (ref >60)
Glucose, Bld: 238 mg/dL — ABNORMAL HIGH (ref 70–99)
Potassium: 3.4 mmol/L — ABNORMAL LOW (ref 3.5–5.1)
Sodium: 134 mmol/L — ABNORMAL LOW (ref 135–145)
Total Bilirubin: 1.2 mg/dL (ref 0.3–1.2)
Total Protein: 6.9 g/dL (ref 6.5–8.1)

## 2020-12-09 LAB — CBC WITH DIFFERENTIAL/PLATELET
Abs Immature Granulocytes: 0.05 10*3/uL (ref 0.00–0.07)
Basophils Absolute: 0 10*3/uL (ref 0.0–0.1)
Basophils Relative: 0 %
Eosinophils Absolute: 0 10*3/uL (ref 0.0–0.5)
Eosinophils Relative: 0 %
HCT: 37.6 % (ref 36.0–46.0)
Hemoglobin: 12.3 g/dL (ref 12.0–15.0)
Immature Granulocytes: 0 %
Lymphocytes Relative: 9 %
Lymphs Abs: 1 10*3/uL (ref 0.7–4.0)
MCH: 28.1 pg (ref 26.0–34.0)
MCHC: 32.7 g/dL (ref 30.0–36.0)
MCV: 86 fL (ref 80.0–100.0)
Monocytes Absolute: 1.3 10*3/uL — ABNORMAL HIGH (ref 0.1–1.0)
Monocytes Relative: 11 %
Neutro Abs: 9.1 10*3/uL — ABNORMAL HIGH (ref 1.7–7.7)
Neutrophils Relative %: 80 %
Platelets: 190 10*3/uL (ref 150–400)
RBC: 4.37 MIL/uL (ref 3.87–5.11)
RDW: 15.1 % (ref 11.5–15.5)
WBC: 11.4 10*3/uL — ABNORMAL HIGH (ref 4.0–10.5)
nRBC: 0 % (ref 0.0–0.2)

## 2020-12-09 LAB — LACTIC ACID, PLASMA: Lactic Acid, Venous: 1.8 mmol/L (ref 0.5–1.9)

## 2020-12-09 LAB — URINALYSIS, COMPLETE (UACMP) WITH MICROSCOPIC
Bilirubin Urine: NEGATIVE
Glucose, UA: NEGATIVE mg/dL
Ketones, ur: NEGATIVE mg/dL
Leukocytes,Ua: NEGATIVE
Nitrite: NEGATIVE
Protein, ur: 100 mg/dL — AB
Specific Gravity, Urine: 1.027 (ref 1.005–1.030)
pH: 5 (ref 5.0–8.0)

## 2020-12-09 LAB — RESP PANEL BY RT-PCR (FLU A&B, COVID) ARPGX2
Influenza A by PCR: NEGATIVE
Influenza B by PCR: NEGATIVE
SARS Coronavirus 2 by RT PCR: NEGATIVE

## 2020-12-09 LAB — PROTIME-INR
INR: 1.1 (ref 0.8–1.2)
Prothrombin Time: 14.1 seconds (ref 11.4–15.2)

## 2020-12-09 LAB — TROPONIN I (HIGH SENSITIVITY)
Troponin I (High Sensitivity): 105 ng/L (ref <18)
Troponin I (High Sensitivity): 93 ng/L — ABNORMAL HIGH (ref <18)
Troponin I (High Sensitivity): 93 ng/L — ABNORMAL HIGH (ref <18)
Troponin I (High Sensitivity): 113 ng/L (ref <18)
Troponin I (High Sensitivity): 133 ng/L (ref <18)

## 2020-12-09 LAB — APTT: aPTT: 28 seconds (ref 24–36)

## 2020-12-09 LAB — LIPASE, BLOOD: Lipase: 35 U/L (ref 11–51)

## 2020-12-09 LAB — HEPARIN LEVEL (UNFRACTIONATED): Heparin Unfractionated: 0.28 IU/mL — ABNORMAL LOW (ref 0.30–0.70)

## 2020-12-09 LAB — D-DIMER, QUANTITATIVE: D-Dimer, Quant: 3.09 ug/mL-FEU — ABNORMAL HIGH (ref 0.00–0.50)

## 2020-12-09 LAB — BRAIN NATRIURETIC PEPTIDE: B Natriuretic Peptide: 398.2 pg/mL — ABNORMAL HIGH (ref 0.0–100.0)

## 2020-12-09 IMAGING — US US EXTREM LOW VENOUS
1 series · 13 of 24 positions shown · non-contrast
Comparison: None.

CLINICAL DATA: Elevated D-dimer. Shortness of breath. Evaluate for
DVT.



[Series 1: us venous img lower bilat (dvt) · portal-venous · 13 of 58 slices shown]
[im 1/58]
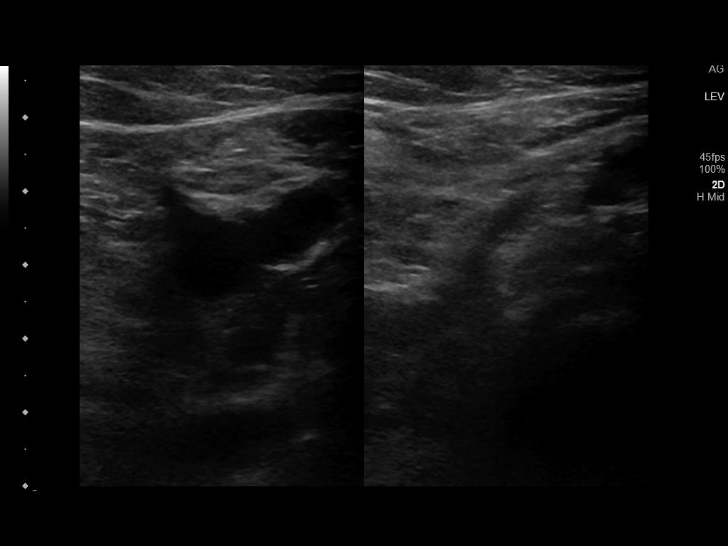
[im 5/58]
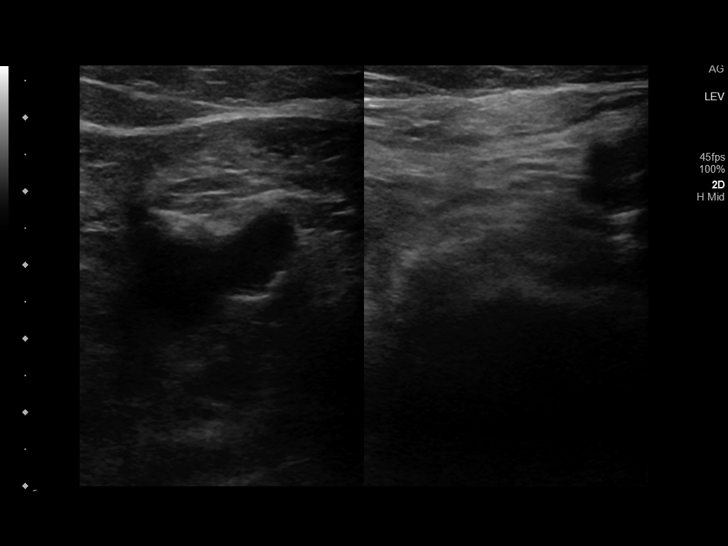
[im 10/58]
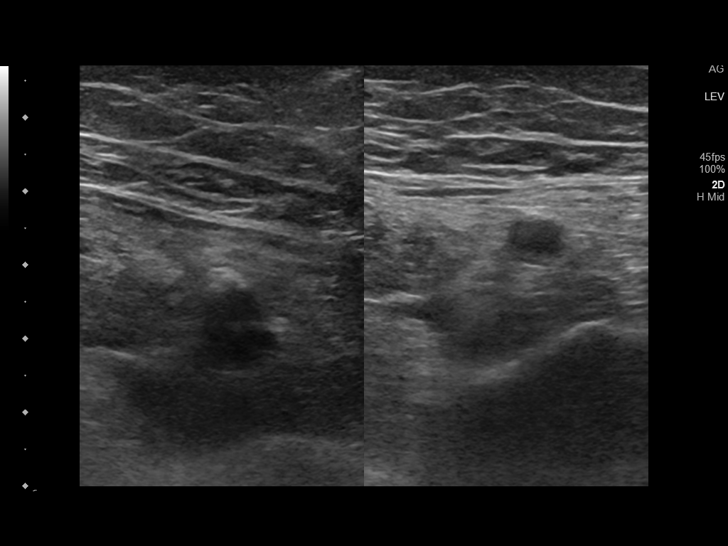
[im 15/58]
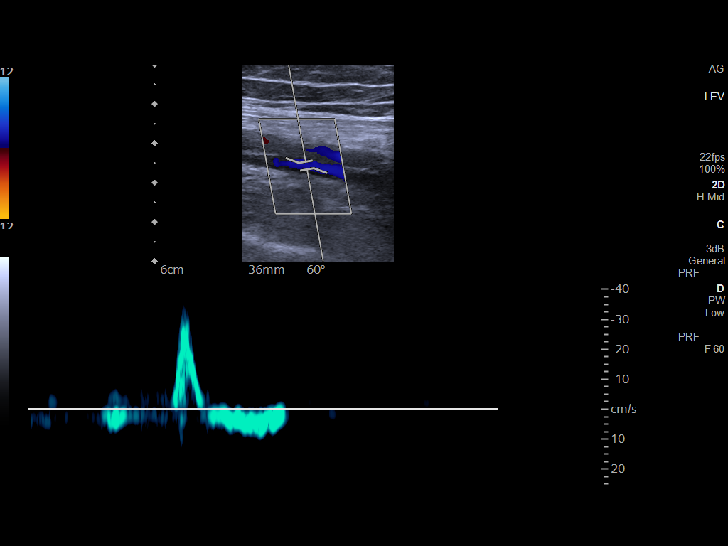
[im 20/58]
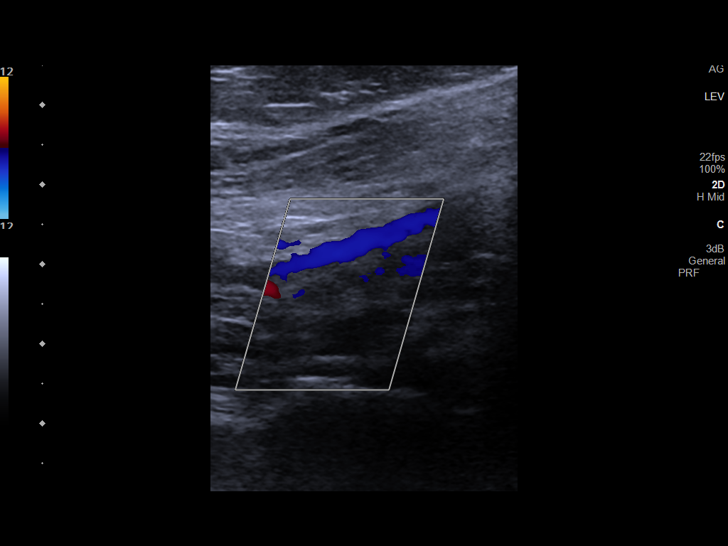
[im 25/58]
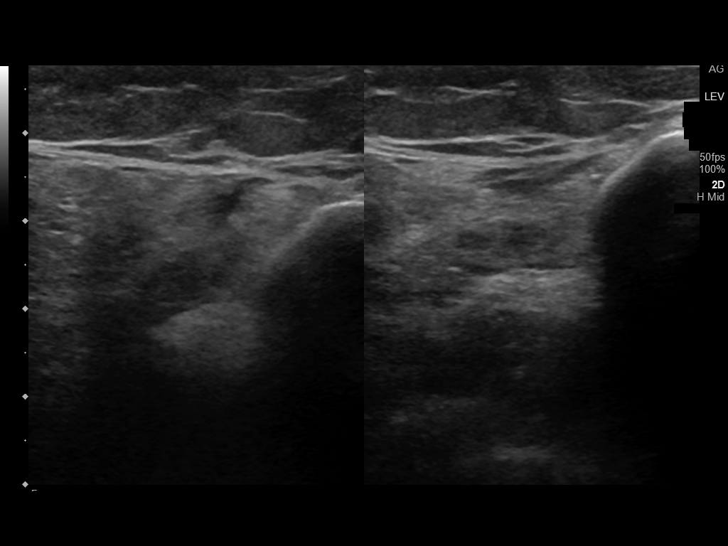
[im 30/58]
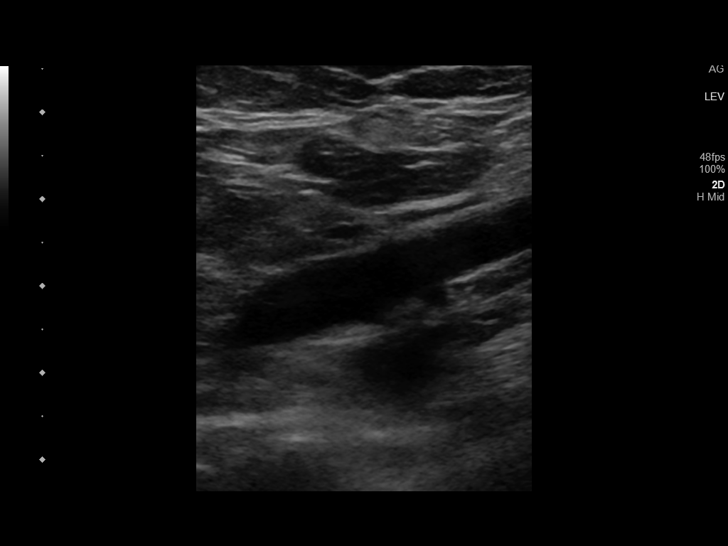
[im 33/58]
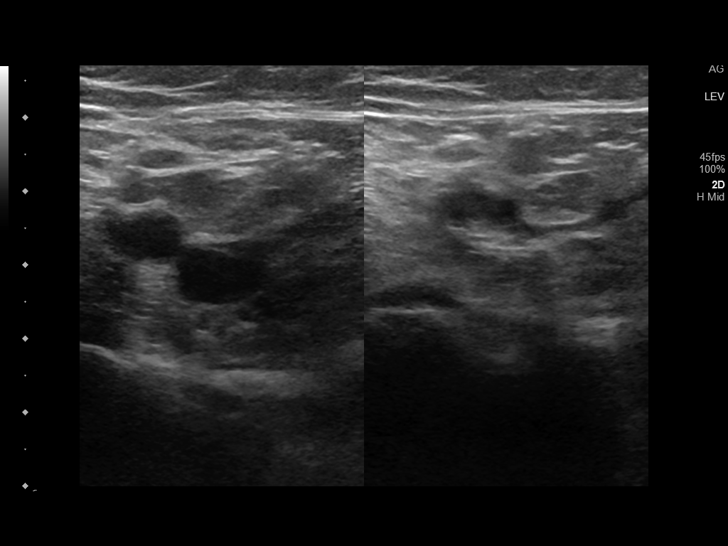
[im 38/58]
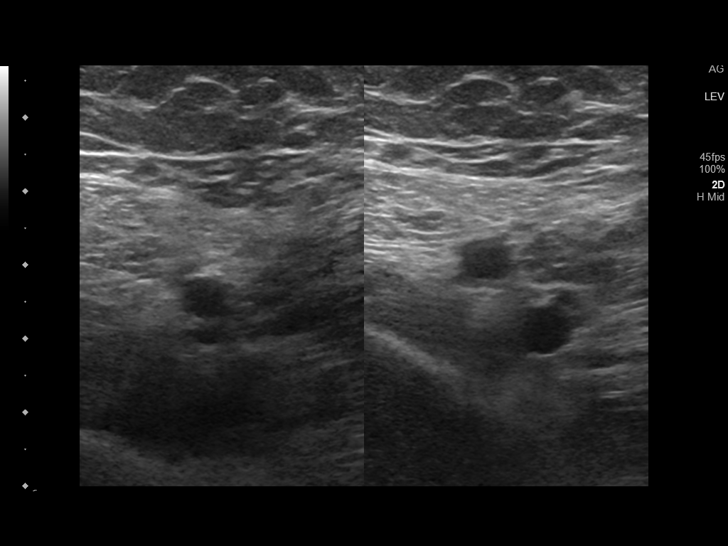
[im 43/58]
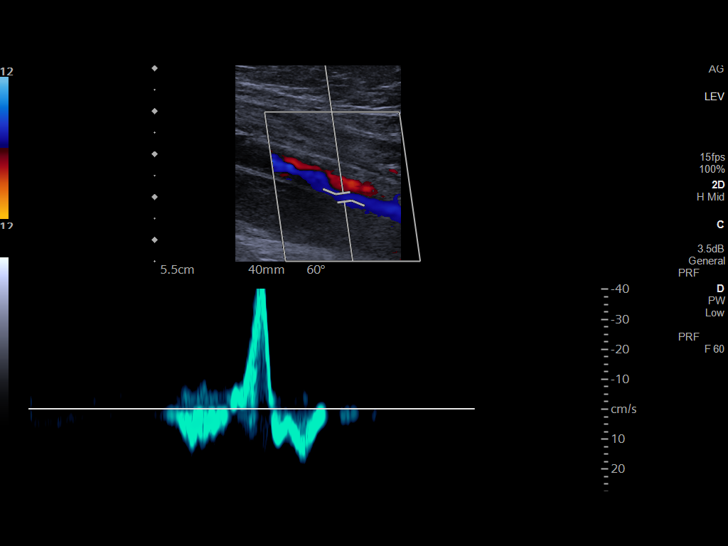
[im 48/58]
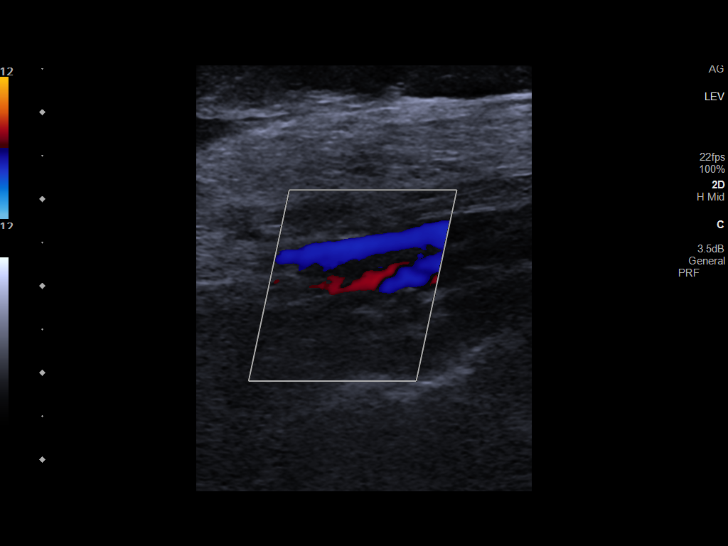
[im 53/58]
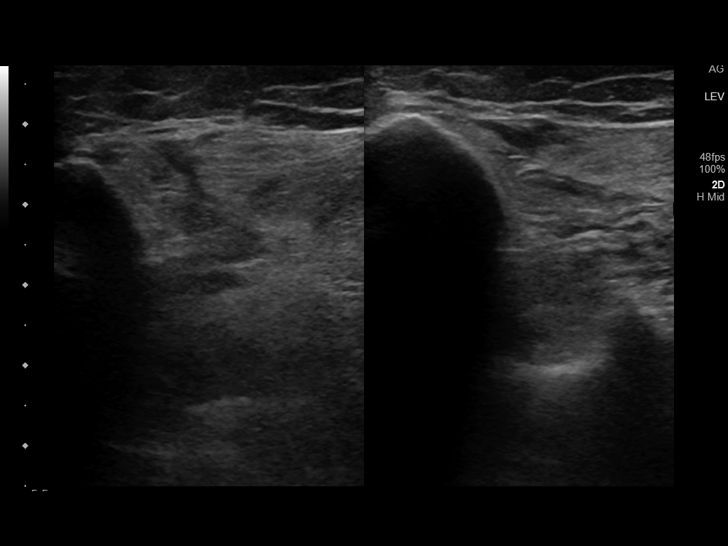
[im 58/58]
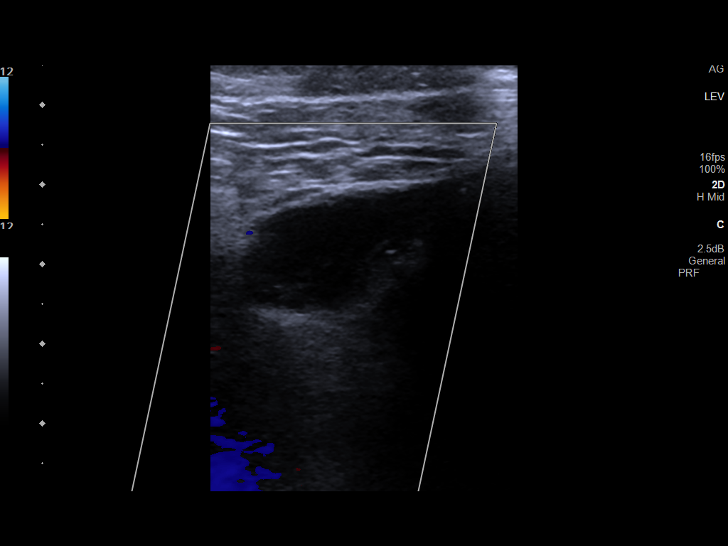

[13 of 24 positions shown; findings below may reference images not displayed]

FINDINGS: RIGHT LOWER EXTREMITY

Common Femoral Vein: No evidence of thrombus. Normal
compressibility, respiratory phasicity and response to augmentation.

Saphenofemoral Junction: No evidence of thrombus. Normal
compressibility and flow on color Doppler imaging.

Profunda Femoral Vein: No evidence of thrombus. Normal
compressibility and flow on color Doppler imaging.

Femoral Vein: No evidence of thrombus. Normal compressibility,
respiratory phasicity and response to augmentation.

Popliteal Vein: No evidence of thrombus. Normal compressibility,
respiratory phasicity and response to augmentation.

Calf Veins: No evidence of thrombus. Normal compressibility and flow
on color Doppler imaging.

Superficial Great Saphenous Vein: No evidence of thrombus. Normal
compressibility.

Venous Reflux:  None.

Other Findings:  None.

LEFT LOWER EXTREMITY

Common Femoral Vein: No evidence of thrombus. Normal
compressibility, respiratory phasicity and response to augmentation.

Saphenofemoral Junction: No evidence of thrombus. Normal
compressibility and flow on color Doppler imaging.

Profunda Femoral Vein: No evidence of thrombus. Normal
compressibility and flow on color Doppler imaging.

Femoral Vein: No evidence of thrombus. Normal compressibility,
respiratory phasicity and response to augmentation.

Popliteal Vein: No evidence of thrombus. Normal compressibility,
respiratory phasicity and response to augmentation.

Calf Veins: No evidence of thrombus. Normal compressibility and flow
on color Doppler imaging.

Superficial Great Saphenous Vein: No evidence of thrombus. Normal
compressibility.

Venous Reflux:  None.

Other Findings: There is an approximately 3.0 x 1.5 x 1.4 cm fluid
collection with left popliteal fossa compatible with a Baker's cyst.
IMPRESSION: 1. No evidence of DVT within either lower extremity.
2. Note made of an approximately 3.0 cm left-sided Baker's cyst.

## 2020-12-09 IMAGING — DX DG CHEST 1V PORT
1 series · 1 of 1 positions shown · non-contrast
Comparison: Chest radiographs [DATE] and earlier.

CLINICAL DATA: 75-year-old female with possible sepsis.

EXAM:
PORTABLE CHEST 1 VIEW

[chest ap]
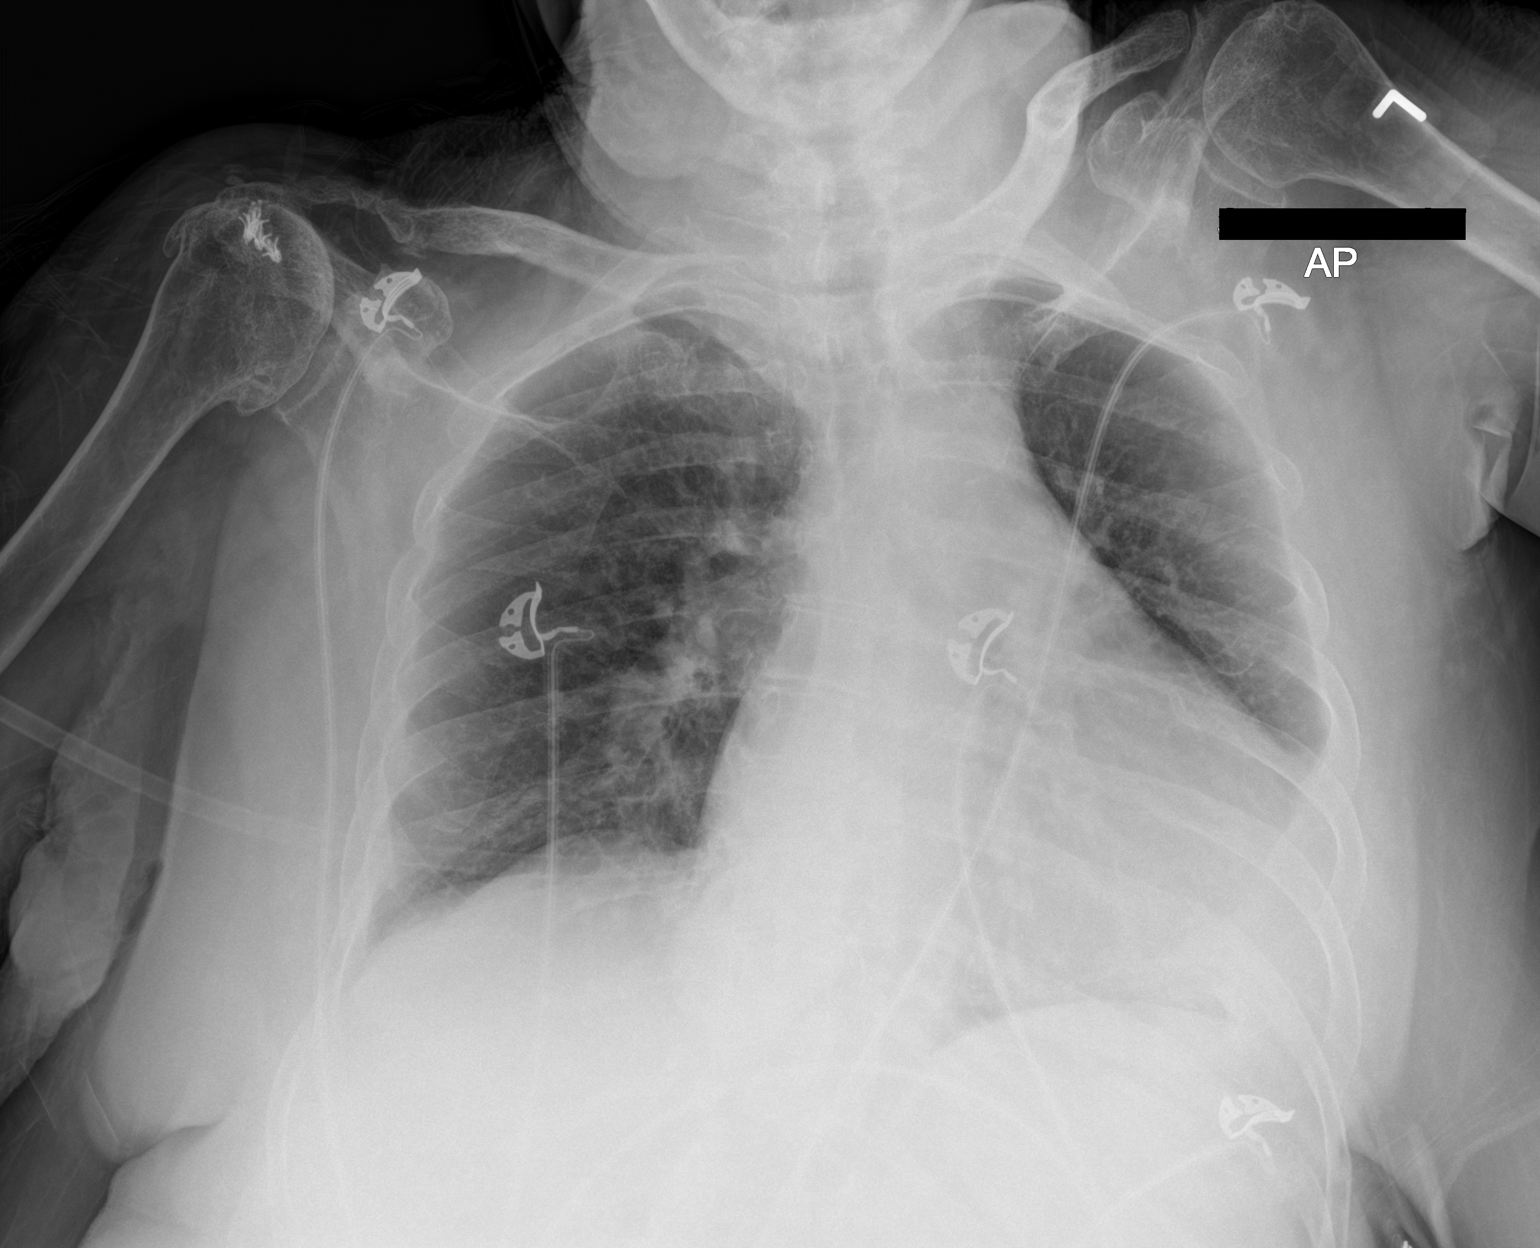

[1 of 1 positions shown; findings below may reference images not displayed]

FINDINGS: Portable AP semi upright view at [NB] hours. Lower lung volumes.
Possible gastric hiatal hernia which was not apparent in [NB].
Otherwise stable cardiomegaly and mediastinal contours. Visualized
tracheal air column is within normal limits. No pneumothorax or
pulmonary edema. No consolidation or air bronchograms identified,
but new blunting of the left lateral lung base is suggestive of
pleural effusion.

Paucity of bowel gas in the upper abdomen. Chronic degenerative and
postoperative changes about the right shoulder. No acute osseous
abnormality identified.
IMPRESSION: 1. Evidence of a small left pleural effusion, but no other acute
cardiopulmonary abnormality identified.
2. Suggestion of hiatal hernia, new since [DATE]. Chronic cardiomegaly.

## 2020-12-09 IMAGING — US US RENAL
1 series · 14 of 25 positions shown · non-contrast
Comparison: None.

CLINICAL DATA: Acute kidney injury

EXAM:
RENAL / URINARY TRACT ULTRASOUND COMPLETE

[Series 1: us renal · 14 of 39 slices shown]
[im 1/39]
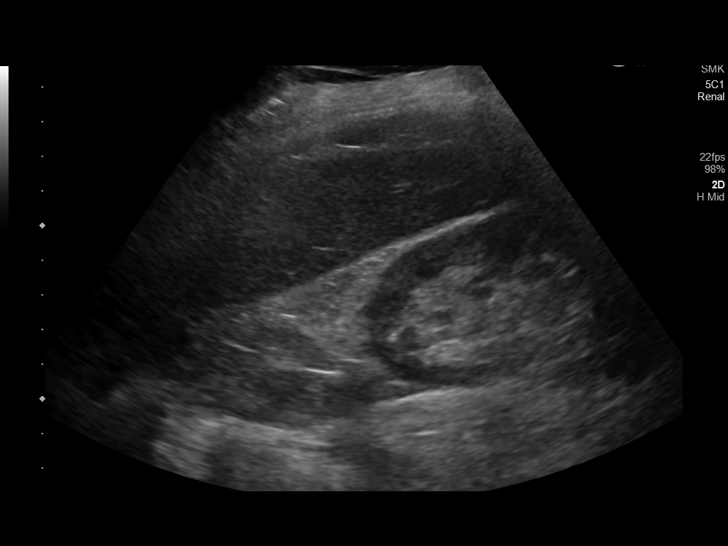
[im 4/39]
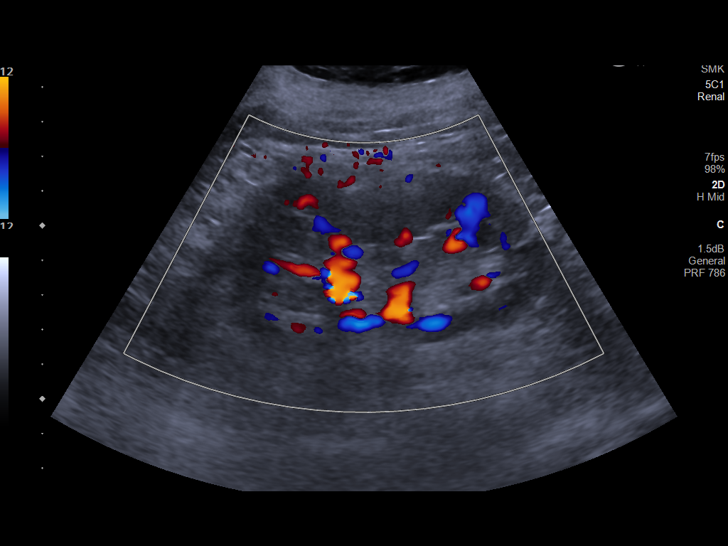
[im 7/39]
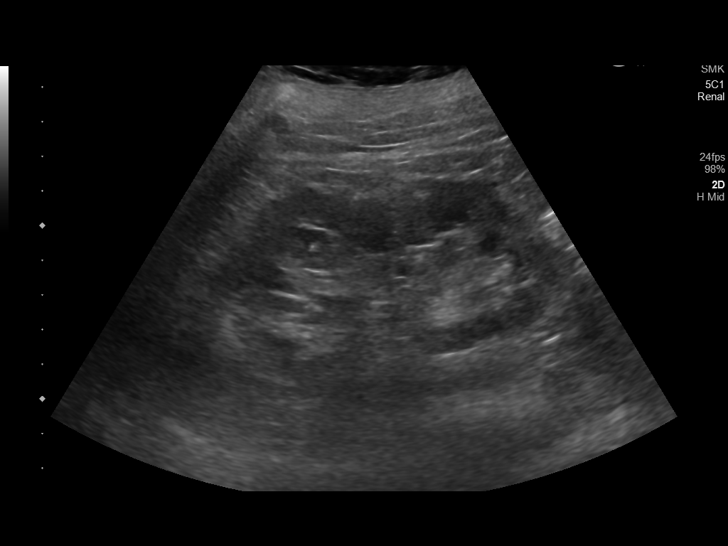
[im 10/39]
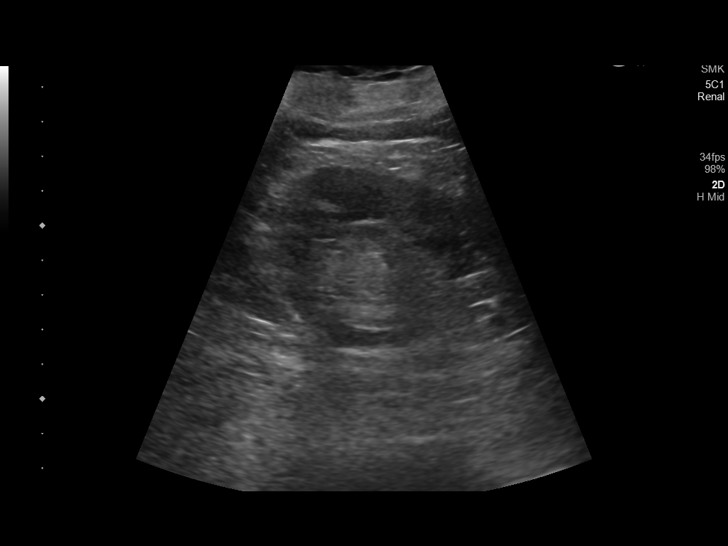
[im 13/39]
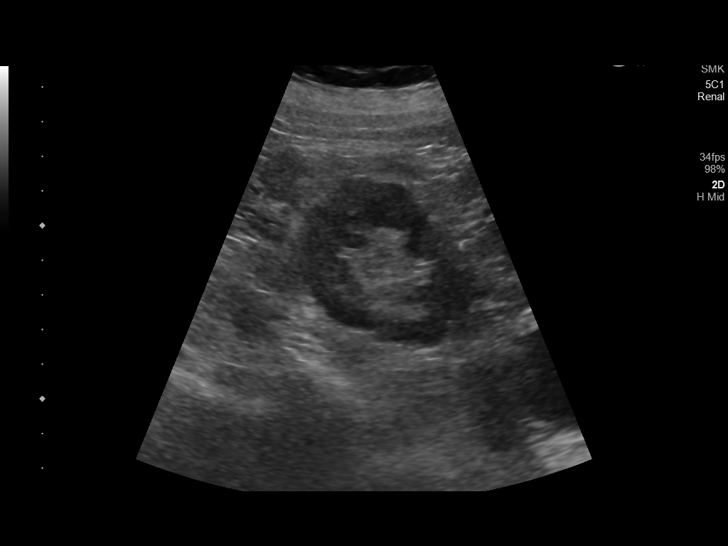
[im 15/39]
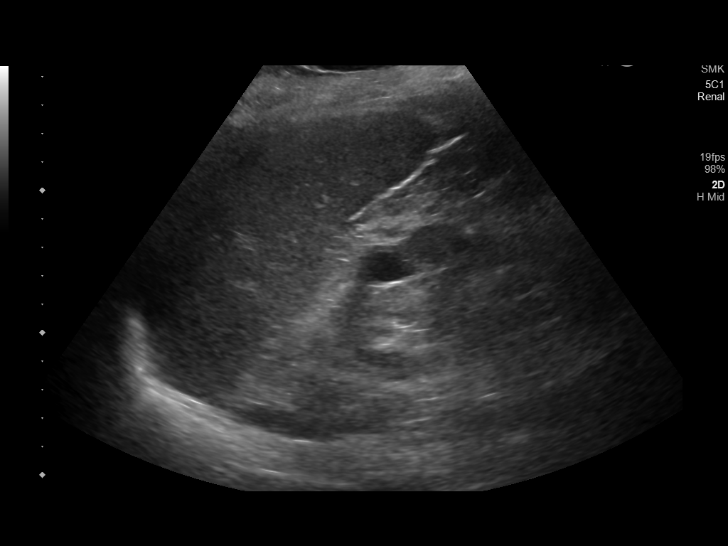
[im 18/39]
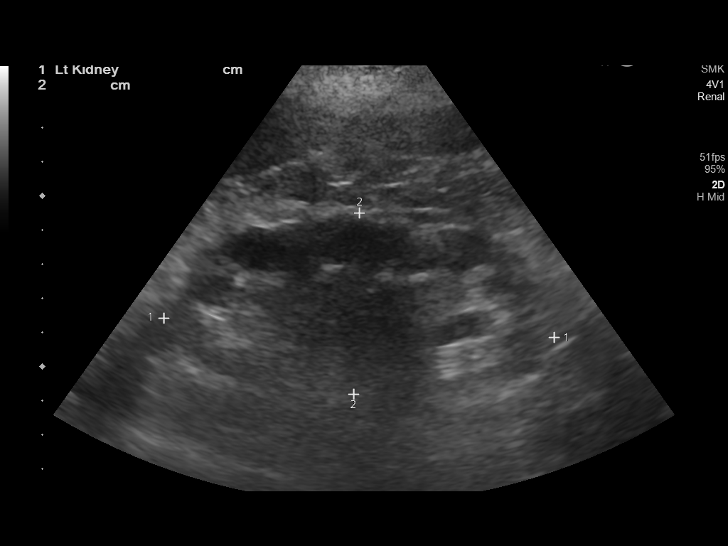
[im 21/39]
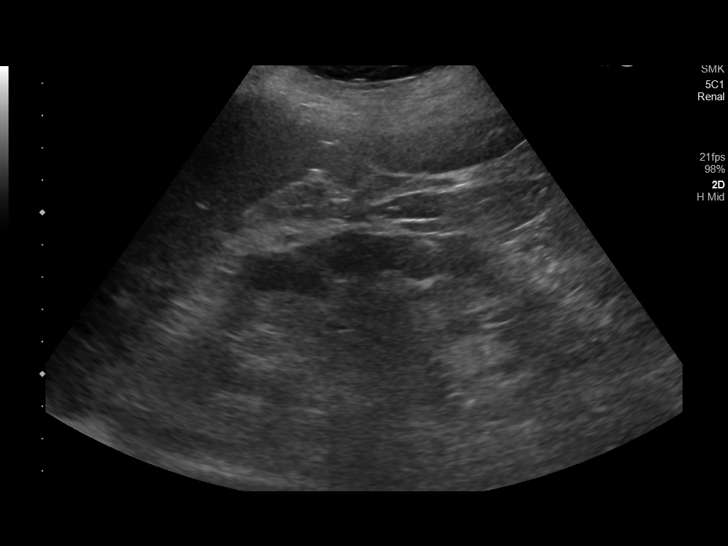
[im 24/39]
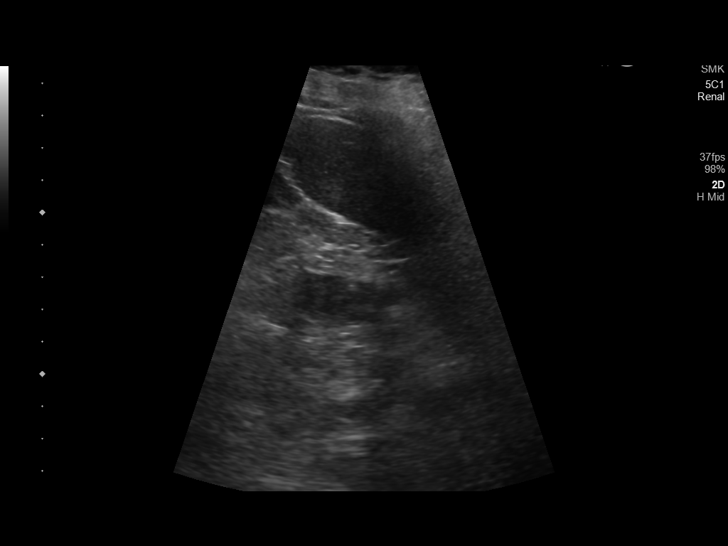
[im 26/39]
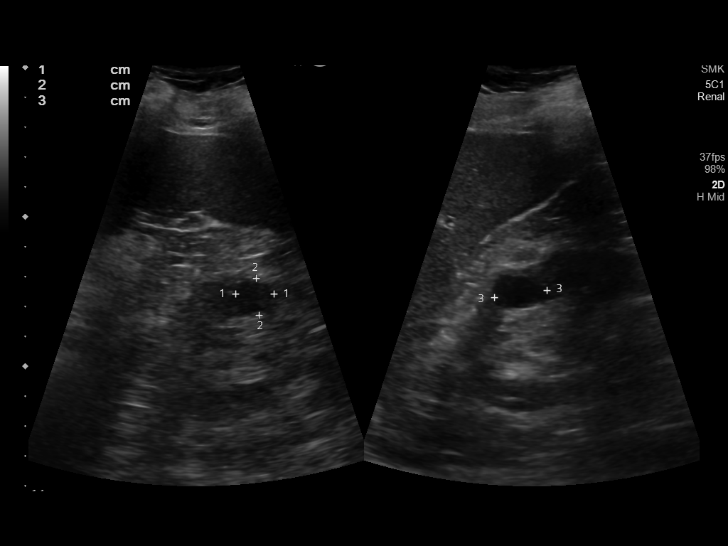
[im 29/39]
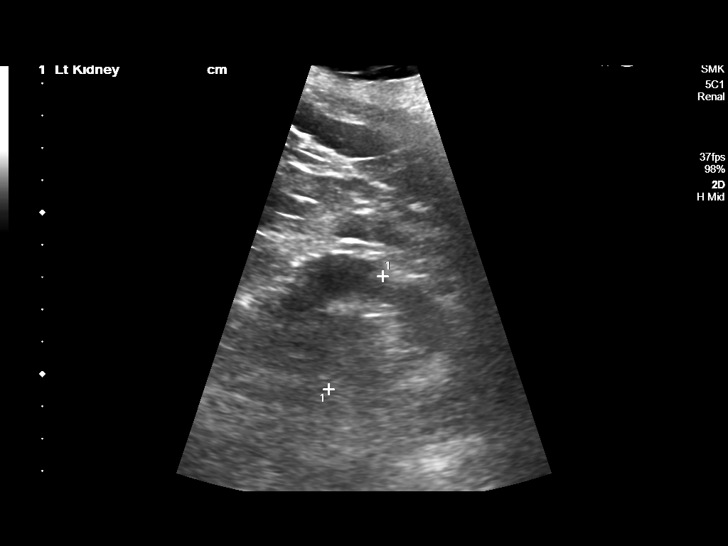
[im 32/39]
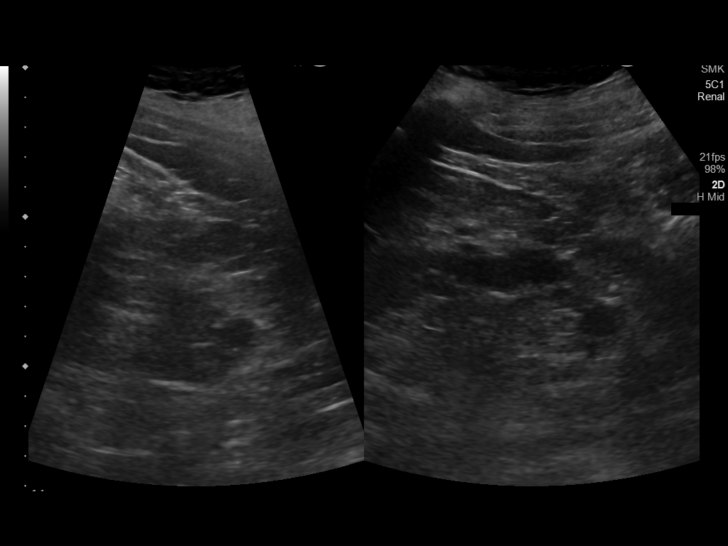
[im 35/39]
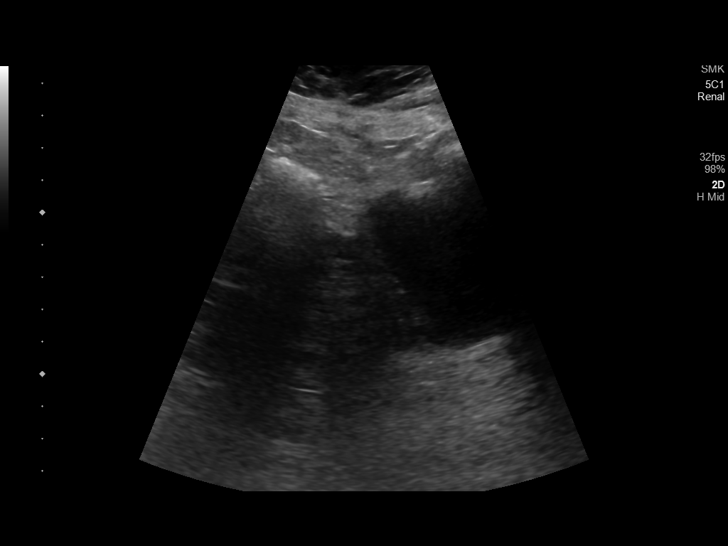
[im 39/39]
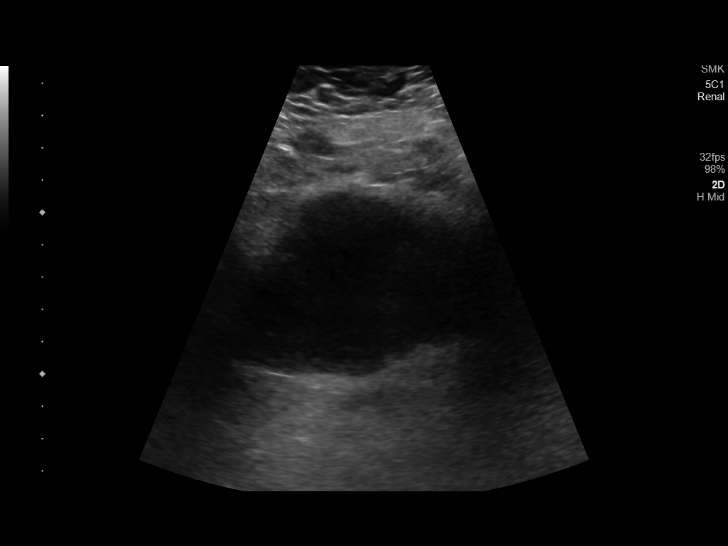

[14 of 25 positions shown; findings below may reference images not displayed]

FINDINGS: Right Kidney:

Renal measurements: 10.4 x 5.0 x 5.4 cm = volume: 147.18 mL.
Echogenicity within normal limits. No mass or hydronephrosis
visualized.

Left Kidney:

Renal measurements: 11.5 x 5.3 x 3.9 cm = volume: 123.9 mL. Upper
pole cyst measures 1.8 x 1.2 x 1.3 cm. Inferior pole cyst measures
1.5 x 1.1 x 1.3 cm Echogenicity within normal limits. No mass or
hydronephrosis.

Bladder:

Appears normal for degree of bladder distention.

Other:

None.
IMPRESSION: 1. Echogenicity within normal limits. No mass or hydronephrosis
visualized.
2. Left kidney cysts.

## 2020-12-09 IMAGING — NM NM PULMONARY PERF PARTICULATE
1 series · 8 of 8 positions shown · non-contrast
Comparison: Portable chest [DATE]. Lower extremity Doppler
ultrasound [DATE].

CLINICAL DATA: Pulmonary embolism suspected. Low intermediate
probability. Elevated D-dimer levels. Negative lower extremity
Doppler ultrasound for DVT.

EXAM:
NUCLEAR MEDICINE PERFUSION LUNG SCAN
TECHNIQUE: Perfusion images were obtained in multiple projections after
intravenous injection of radiopharmaceutical.
Ventilation scans intentionally deferred if perfusion scan and chest
x-ray adequate for interpretation during COVID 19 epidemic.
RADIOPHARMACEUTICALS:  4.52 mCi [0J] MAA IV

[Series 1000: lung perfusion · 1.65mm/px · 4 acquisitions, 8 frames shown]
[im 1/4]
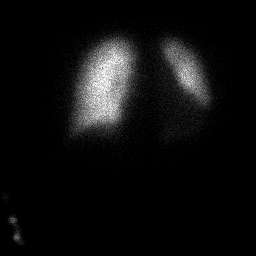
[im 1/4]
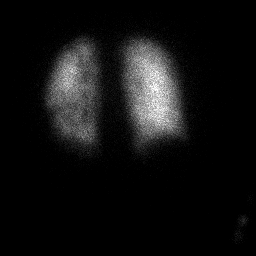
[im 2/4]
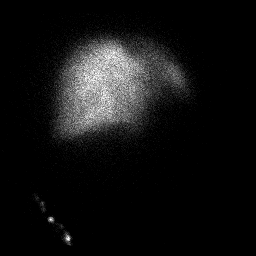
[im 2/4]
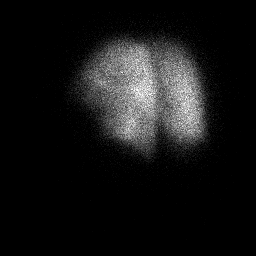
[im 3/4  full-range]
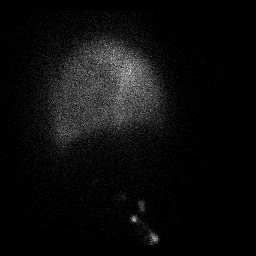
[im 3/4  full-range]
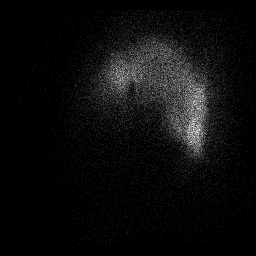
[im 4/4]
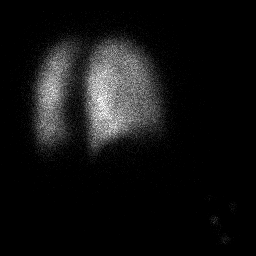
[im 4/4]
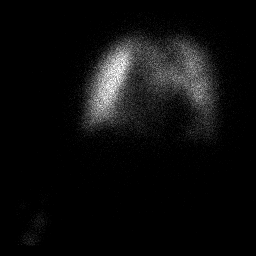

[8 of 8 positions shown; findings below may reference images not displayed]

FINDINGS: There are no segmental perfusion defects to suggest pulmonary
embolism. The heart is enlarged. There is mildly decreased perfusion
to the left lung base, corresponding with a probable small left
pleural effusion or pleural thickening on radiography.
IMPRESSION: No evidence of pulmonary embolism.

## 2020-12-09 MED ORDER — HYDROXYZINE HCL 25 MG PO TABS
50.0000 mg | ORAL_TABLET | Freq: Every day | ORAL | Status: DC
  Administered 2020-12-09 – 2020-12-15 (×6): 50 mg via ORAL
  Filled 2020-12-09 (×2): qty 1
  Filled 2020-12-09 (×3): qty 2
  Filled 2020-12-09 (×2): qty 1
  Filled 2020-12-09: qty 2

## 2020-12-09 MED ORDER — MUPIROCIN 2 % EX OINT
1.0000 "application " | TOPICAL_OINTMENT | Freq: Two times a day (BID) | CUTANEOUS | Status: DC | PRN
  Filled 2020-12-09: qty 22

## 2020-12-09 MED ORDER — INSULIN ASPART PROT & ASPART (70-30 MIX) 100 UNIT/ML ~~LOC~~ SUSP
8.0000 [IU] | Freq: Two times a day (BID) | SUBCUTANEOUS | Status: DC
  Administered 2020-12-09 – 2020-12-10 (×2): 8 [IU] via SUBCUTANEOUS
  Filled 2020-12-09: qty 10

## 2020-12-09 MED ORDER — HEPARIN (PORCINE) 25000 UT/250ML-% IV SOLN
1000.0000 [IU]/h | INTRAVENOUS | Status: DC
  Administered 2020-12-09: 900 [IU]/h via INTRAVENOUS
  Filled 2020-12-09 (×2): qty 250

## 2020-12-09 MED ORDER — MORPHINE SULFATE (PF) 4 MG/ML IV SOLN
4.0000 mg | Freq: Once | INTRAVENOUS | Status: AC
  Administered 2020-12-09: 4 mg via INTRAVENOUS
  Filled 2020-12-09: qty 1

## 2020-12-09 MED ORDER — ASPIRIN 81 MG PO CHEW
324.0000 mg | CHEWABLE_TABLET | Freq: Once | ORAL | Status: AC
  Administered 2020-12-09: 324 mg via ORAL
  Filled 2020-12-09: qty 4

## 2020-12-09 MED ORDER — HEPARIN BOLUS VIA INFUSION
4000.0000 [IU] | Freq: Once | INTRAVENOUS | Status: AC
  Administered 2020-12-09: 4000 [IU] via INTRAVENOUS
  Filled 2020-12-09: qty 4000

## 2020-12-09 MED ORDER — POTASSIUM CHLORIDE CRYS ER 20 MEQ PO TBCR
40.0000 meq | EXTENDED_RELEASE_TABLET | Freq: Once | ORAL | Status: AC
  Administered 2020-12-09: 40 meq via ORAL
  Filled 2020-12-09: qty 2

## 2020-12-09 MED ORDER — ACETAMINOPHEN 325 MG PO TABS
650.0000 mg | ORAL_TABLET | Freq: Four times a day (QID) | ORAL | Status: DC | PRN
  Administered 2020-12-10 – 2020-12-17 (×9): 650 mg via ORAL
  Filled 2020-12-09 (×9): qty 2

## 2020-12-09 MED ORDER — INSULIN ISOPHANE & REGULAR (HUMAN 70-30)100 UNIT/ML KWIKPEN: 8.0000 [IU] | PEN_INJECTOR | Freq: Two times a day (BID) | SUBCUTANEOUS | Status: DC

## 2020-12-09 MED ORDER — TRAMADOL HCL 50 MG PO TABS
50.0000 mg | ORAL_TABLET | Freq: Every day | ORAL | Status: DC
  Administered 2020-12-09 – 2020-12-10 (×2): 50 mg via ORAL
  Filled 2020-12-09 (×2): qty 1

## 2020-12-09 MED ORDER — PANTOPRAZOLE SODIUM 40 MG PO TBEC
40.0000 mg | DELAYED_RELEASE_TABLET | Freq: Every day | ORAL | Status: DC
  Administered 2020-12-09 – 2020-12-19 (×10): 40 mg via ORAL
  Filled 2020-12-09 (×9): qty 1

## 2020-12-09 MED ORDER — TRAZODONE HCL 50 MG PO TABS
50.0000 mg | ORAL_TABLET | Freq: Every day | ORAL | Status: DC
  Administered 2020-12-09 – 2020-12-17 (×6): 50 mg via ORAL
  Filled 2020-12-09 (×7): qty 1

## 2020-12-09 MED ORDER — ISOSORBIDE MONONITRATE ER 30 MG PO TB24
30.0000 mg | ORAL_TABLET | Freq: Every day | ORAL | Status: DC
  Administered 2020-12-09 – 2020-12-11 (×3): 30 mg via ORAL
  Filled 2020-12-09 (×3): qty 1

## 2020-12-09 MED ORDER — ALBUTEROL SULFATE (2.5 MG/3ML) 0.083% IN NEBU: 2.5000 mg | INHALATION_SOLUTION | RESPIRATORY_TRACT | Status: DC | PRN

## 2020-12-09 MED ORDER — HYDRALAZINE HCL 20 MG/ML IJ SOLN: 5.0000 mg | INTRAMUSCULAR | Status: DC | PRN

## 2020-12-09 MED ORDER — DM-GUAIFENESIN ER 30-600 MG PO TB12: 1.0000 | ORAL_TABLET | Freq: Two times a day (BID) | ORAL | Status: DC | PRN

## 2020-12-09 MED ORDER — ATORVASTATIN CALCIUM 80 MG PO TABS
80.0000 mg | ORAL_TABLET | Freq: Every day | ORAL | Status: DC
  Administered 2020-12-09 – 2020-12-12 (×4): 80 mg via ORAL
  Filled 2020-12-09 (×4): qty 4

## 2020-12-09 MED ORDER — MORPHINE SULFATE (PF) 2 MG/ML IV SOLN
1.0000 mg | INTRAVENOUS | Status: DC | PRN
  Administered 2020-12-09 – 2020-12-10 (×2): 1 mg via INTRAVENOUS
  Filled 2020-12-09 (×3): qty 1

## 2020-12-09 MED ORDER — SODIUM CHLORIDE 0.9 % IV SOLN: INTRAVENOUS | Status: DC

## 2020-12-09 MED ORDER — HEPARIN BOLUS VIA INFUSION
1000.0000 [IU] | Freq: Once | INTRAVENOUS | Status: AC
  Administered 2020-12-09: 1000 [IU] via INTRAVENOUS
  Filled 2020-12-09: qty 1000

## 2020-12-09 MED ORDER — TECHNETIUM TO 99M ALBUMIN AGGREGATED
4.5200 | Freq: Once | INTRAVENOUS | Status: AC | PRN
  Administered 2020-12-09: 4.52 via INTRAVENOUS

## 2020-12-09 MED ORDER — INSULIN ASPART 100 UNIT/ML IJ SOLN
0.0000 [IU] | Freq: Three times a day (TID) | INTRAMUSCULAR | Status: DC
  Administered 2020-12-09: 2 [IU] via SUBCUTANEOUS
  Administered 2020-12-11: 1 [IU] via SUBCUTANEOUS
  Administered 2020-12-16: 3 [IU] via SUBCUTANEOUS
  Filled 2020-12-09 (×7): qty 1

## 2020-12-09 MED ORDER — CLOPIDOGREL BISULFATE 75 MG PO TABS
75.0000 mg | ORAL_TABLET | Freq: Every day | ORAL | Status: DC
  Administered 2020-12-09 – 2020-12-17 (×8): 75 mg via ORAL
  Filled 2020-12-09 (×8): qty 1

## 2020-12-09 MED ORDER — ONDANSETRON HCL 4 MG/2ML IJ SOLN
4.0000 mg | INTRAMUSCULAR | Status: AC
  Administered 2020-12-09: 4 mg via INTRAVENOUS
  Filled 2020-12-09: qty 2

## 2020-12-09 MED ORDER — CEFAZOLIN SODIUM-DEXTROSE 2-4 GM/100ML-% IV SOLN
2.0000 g | Freq: Two times a day (BID) | INTRAVENOUS | Status: DC
  Administered 2020-12-09 – 2020-12-12 (×7): 2 g via INTRAVENOUS
  Filled 2020-12-09 (×11): qty 100

## 2020-12-09 MED ORDER — BUPROPION HCL ER (XL) 150 MG PO TB24
150.0000 mg | ORAL_TABLET | Freq: Every day | ORAL | Status: DC
  Administered 2020-12-09 – 2020-12-12 (×4): 150 mg via ORAL
  Filled 2020-12-09 (×6): qty 1

## 2020-12-09 MED ORDER — INSULIN ASPART 100 UNIT/ML IJ SOLN: 0.0000 [IU] | Freq: Every day | INTRAMUSCULAR | Status: DC

## 2020-12-09 NOTE — ED Notes (Signed)
Patients daughter called and given an update at this time.

## 2020-12-09 NOTE — Progress Notes (Signed)
ANTICOAGULATION CONSULT NOTE  Pharmacy Consult for heparin infusion Indication: nSTEMI/ACS  Allergies  Allergen Reactions   Gabapentin Diarrhea   Sulfa Antibiotics Rash    Mouth blisters    Patient Measurements: Height: 5' (152.4 cm) Weight: 102.1 kg (225 lb) IBW/kg (Calculated) : 45.5 Heparin Dosing Weight: 70.4 kg  Vital Signs: Temp: 97.9 F (36.6 C) (09/21 0353) Temp Source: Oral (09/21 0353) BP: 120/59 (09/21 0600) Pulse Rate: 78 (09/21 0645)  Labs: Recent Labs    12/09/20 0415  HGB 12.3  HCT 37.6  PLT 190  CREATININE 2.02*  TROPONINIHS 105*    Estimated Creatinine Clearance: 26.7 mL/min (A) (by C-G formula based on SCr of 2.02 mg/dL (H)).   Medical History: Past Medical History:  Diagnosis Date   Anxiety    Bell's palsy 08/2018   CHF (congestive heart failure) (HCC)    Chronic back pain    Chronic cough    CKD (chronic kidney disease), stage III (HCC)    Depression    Diabetes mellitus, type 2 (HCC)    GERD (gastroesophageal reflux disease)    Hyperlipidemia    Hypertension    Multilevel degenerative disc disease    Myocardial infarction [facility]) 2007   & 2014   Neuropathy    Osteoporosis     Assessment: Pt is 75 yo female presenting with generalized weakness, found w/ possible nSTEMI/ACS.  Goal of Therapy:  Heparin level 0.3-0.7 units/ml Monitor platelets by anticoagulation protocol: Yes   Plan:  Give 4000 units bolus x 1 Start heparin infusion at 0900 unit/hr Will check HL in 8 hrs after start of infusion CBC daily while on heparin  Otelia Sergeant, PharmD, [facility] 12/09/2020 7:04 AM

## 2020-12-09 NOTE — ED Notes (Signed)
Pt transferred from stretcher to hospital bed three person by this tech, Shawna Orleans, EDT and Jeffers, EDT. Pt incontinent of stool. Pt was cleaned with peri wipes and a clean brief placed on pt. Warm blankets placed on pt. Call light within reach. Pt has no further needs at this time.

## 2020-12-09 NOTE — Significant Event (Signed)
[facility] STEMI Evaluation Note  Date: 12/09/20 Time: 6:38 AM  I was contacted by Dr. York Cerise to evaluate the patient's EKG.  She reportedly presented via EMS due to weakness and denies chest pain.  She was noted to be hypoxic by EMS.  EKG in the ED shows sinus rhythm with PAC's and PVC's, bifascicular block (RBBB and LPFB), and TWI in inferior and anterior leads with associated ST depression.  EKG has changed since prior in 2014 with possible ischemic changes noted today.  EKG does not meet STEMI criteria.  The patient has a history of multiple PCI's and chronic HFrEF followed by Dr. Darrold Junker.  Labs today are notable for mildly elevated HS-TnI, elevated BNP, and AKI.  I recommend against emergent catheterization at this time.  Further workup of possible acute on chronic HF, ACS, and PE were discussed with Dr. York Cerise.  Please consult [facility] cardiology if further cardiology questions/concerns arise.  Yvonne Kendall, MD [facility]

## 2020-12-09 NOTE — ED Notes (Signed)
Water provided upon request; female visitor at bedside

## 2020-12-09 NOTE — H&P (Signed)
History and Physical    Jamie Benitez:[id number] DOB: [birth year] DOA: 12/09/2020  Referring MD/NP/PA:   PCP: Kandyce Rud, MD   Patient coming from:  The patient is coming from home.  At baseline, pt is independent for most of ADL.        Chief Complaint: weakness  HPI: Jamie Benitez is a 75 y.o. female with medical history significant of sCHF with EF 35%, CAD with stent placement, CKD-3A, chronic back pain, Bell's palsy, HTN, hyperlipidemia, diabetes mellitus, GERD, depression with anxiety, who presents with weakness.  Patient states that she has generalized weakness for more than 4 days.  No unilateral numbness or tingling to extremities.  No facial droop or slurred speech.  Patient has dry cough, denies chest pain or shortness breath.  No fever or chills.  Patient states that she had diarrhea in the past several days, which has resolved.  Currently no nausea, vomiting, diarrhea or abdominal pain.  No symptoms of UTI.  Patient complains of chronic lower back pain. Initially patient had oxygen desaturation to mid 80s, which improved to 98% on room air in ED.  Initial trop 105 with ST depression and T wave inversion in inferior leads and anterior leads.  Dr. Okey Dupre of cardiology was consulted, who did not think patient had STEMI. He recommended [facility] cardiology consult. IV heparin is started in ED  ED Course: pt was found to have D-dimer 3.09, troponin level 105 --> 93 --> 93, negative COVID PCR, WBC 11.4, lactic acid of 1.8, INR 1.1, PTT 28, BNP 398, lipase 35, worsening renal function, temperature normal, blood pressure 104/79, heart rate 78, RR 23, chest x-ray showed small left pleural effusion and cardiomegaly.  VQ scan negative for PE.  Lower extremity Dopplers negative for DVT.  Patient is placed progressive bed for observation. Dr. Juliann Pares of card is consulted.  Review of Systems:   General: no fevers, chills, no body weight gain, has fatigue HEENT: no blurry vision, hearing  changes or sore throat Respiratory: no dyspnea, has coughing, no wheezing CV: no chest pain, no palpitations GI: no nausea, vomiting, abdominal pain, had diarrhea, no constipation GU: no dysuria, burning on urination, increased urinary frequency, hematuria  Ext: has leg edema Neuro: no unilateral weakness, numbness, or tingling, no vision change or hearing loss Skin: no rash, no skin tear. MSK: No muscle spasm, no deformity, no limitation of range of movement in spin Heme: No easy bruising.  Travel history: No recent long distant travel.  Allergy:  Allergies  Allergen Reactions   Sulfa Antibiotics Rash    Mouth blisters Other reaction(s): Angioedema "whole mouth swells"   Gabapentin Diarrhea    Past Medical History:  Diagnosis Date   Anxiety    Bell's palsy 08/2018   CHF (congestive heart failure) (HCC)    Chronic back pain    Chronic cough    CKD (chronic kidney disease), stage III (HCC)    Depression    Diabetes mellitus, type 2 (HCC)    GERD (gastroesophageal reflux disease)    Hyperlipidemia    Hypertension    Multilevel degenerative disc disease    Myocardial infarction [facility]) 2007   & 2014   Neuropathy    Osteoporosis     Past Surgical History:  Procedure Laterality Date   ABDOMINAL HYSTERECTOMY     BACK SURGERY     CARDIAC CATHETERIZATION  2007   & 2014.  stents   CATARACT EXTRACTION W/PHACO Right 09/16/2019   Procedure: CATARACT  EXTRACTION PHACO AND INTRAOCULAR LENS PLACEMENT (IOC) RIGHT DIABETIC 2.12  00:27.3;  Surgeon: Nevada Crane, MD;  Location: [facility];  Service: Ophthalmology;  Laterality: Right;  Diabetic - insulin and oral meds   ROTATOR CUFF REPAIR     x2    Social History:  reports that she quit smoking about 8 years ago. Her smoking use included cigarettes. She has never used smokeless tobacco. She reports that she does not currently use alcohol. She reports that she does not use drugs.  Family History:  Family History   Problem Relation Age of Onset   Diabetes type II Daughter    Diabetes type II Son      Prior to Admission medications   Medication Sig Start Date End Date Taking? Authorizing Provider  atorvastatin (LIPITOR) 80 MG tablet Take 80 mg by mouth daily.    [provider]  buPROPion (WELLBUTRIN SR) 150 MG 12 hr tablet Take 150 mg by mouth daily.    [provider]  clopidogrel (PLAVIX) 75 MG tablet Take 75 mg by mouth daily.    [provider]  glipiZIDE (GLUCOTROL XL) 2.5 MG 24 hr tablet Take 2.5 mg by mouth daily with breakfast.    [provider]  hydrOXYzine (ATARAX/VISTARIL) 50 MG tablet Take 50 mg by mouth daily.    [provider]  insulin NPH-regular Human (70-30) 100 UNIT/ML injection Inject 10 Units into the skin 2 (two) times daily with a meal.    [provider]  isosorbide mononitrate (IMDUR) 30 MG 24 hr tablet Take 30 mg by mouth daily.    [provider]  lisinopril (ZESTRIL) 5 MG tablet Take 10 mg by mouth daily.    [provider]  meloxicam (MOBIC) 15 MG tablet Take 15 mg by mouth daily.    [provider]  mupirocin ointment (BACTROBAN) 2 % Place 1 application into the nose 2 (two) times daily as needed.    [provider]  omeprazole (PRILOSEC) 40 MG capsule Take 40 mg by mouth daily.    [provider]  traMADol (ULTRAM) 50 MG tablet Take by mouth daily.    [provider]  traZODone (DESYREL) 50 MG tablet Take 50 mg by mouth at bedtime.    [provider]    Physical Exam: Vitals:   12/09/20 0900 12/09/20 0930 12/09/20 1215 12/09/20 1300  BP: 91/78 100/75 (!) 128/98 (!) 157/82  Pulse: 73 (!) 56 94 97  Resp: 15 19 20  (!) 28  Temp:      TempSrc:      SpO2: 97%  99% 100%  Weight:      Height:       General: Not in acute distress HEENT:       Eyes: PERRL, EOMI, no scleral icterus.       ENT: No discharge from the ears and nose, no pharynx injection,  no tonsillar enlargement.        Neck: No JVD, no bruit, no mass felt. Heme: No neck lymph node enlargement. Cardiac: S1/S2, RRR, No murmurs, No gallops or rubs. Respiratory: No rales, wheezing, rhonchi or rubs. GI: Soft, nondistended, nontender, no rebound pain, no organomegaly, BS present. GU: No hematuria Ext: 1+ pitting leg edema bilaterally. 1+DP/PT pulse bilaterally. Musculoskeletal: No joint deformities, No joint redness or warmth, no limitation of ROM in spin. Skin: No rashes.  Neuro: Alert, oriented X3, cranial nerves II-XII grossly intact, moves all extremities normally.  Psych: Patient is not  psychotic, no suicidal or hemocidal ideation.  Labs on Admission: I have personally reviewed following labs and imaging studies  CBC: Recent Labs  Lab 12/09/20 0415  WBC 11.4*  NEUTROABS 9.1*  HGB 12.3  HCT 37.6  MCV 86.0  PLT 190   Basic Metabolic Panel: Recent Labs  Lab 12/09/20 0415  NA 134*  K 3.4*  CL 100  CO2 22  GLUCOSE 238*  BUN 35*  CREATININE 2.02*  CALCIUM 8.5*  MG 1.8   GFR: Estimated Creatinine Clearance: 26.7 mL/min (A) (by C-G formula based on SCr of 2.02 mg/dL (H)). Liver Function Tests: Recent Labs  Lab 12/09/20 0415  AST 37  ALT 24  ALKPHOS 85  BILITOT 1.2  PROT 6.9  ALBUMIN 3.5   Recent Labs  Lab 12/09/20 0415  LIPASE 35   No results for input(s): AMMONIA in the last 168 hours. Coagulation Profile: Recent Labs  Lab 12/09/20 0616  INR 1.1   Cardiac Enzymes: No results for input(s): CKTOTAL, CKMB, CKMBINDEX, TROPONINI in the last 168 hours. BNP (last 3 results) No results for input(s): PROBNP in the last 8760 hours. HbA1C: No results for input(s): HGBA1C in the last 72 hours. CBG: Recent Labs  Lab 12/09/20 0804 12/09/20 1254  GLUCAP 152* 137*   Lipid Profile: No results for input(s): CHOL, HDL, LDLCALC, TRIG, CHOLHDL, LDLDIRECT in the last 72 hours. Thyroid Function Tests: No results for input(s): TSH, T4TOTAL, FREET4,  T3FREE, THYROIDAB in the last 72 hours. Anemia Panel: No results for input(s): VITAMINB12, FOLATE, FERRITIN, TIBC, IRON, RETICCTPCT in the last 72 hours. Urine analysis: No results found for: COLORURINE, APPEARANCEUR, LABSPEC, PHURINE, GLUCOSEU, HGBUR, BILIRUBINUR, KETONESUR, PROTEINUR, UROBILINOGEN, NITRITE, LEUKOCYTESUR Sepsis Labs: @LABRCNTIP (procalcitonin:4,lacticidven:4) ) Recent Results (from the past 240 hour(s))  Resp Panel by RT-PCR (Flu A&B, Covid) Nasopharyngeal Swab     Status: None   Collection Time: 12/09/20  4:34 AM   Specimen: Nasopharyngeal Swab; Nasopharyngeal(NP) swabs in vial transport medium  Result Value Ref Range Status   SARS Coronavirus 2 by RT PCR NEGATIVE NEGATIVE Final    Comment: (NOTE) SARS-CoV-2 target nucleic acids are NOT DETECTED.  The SARS-CoV-2 RNA is generally detectable in upper respiratory specimens during the acute phase of infection. The lowest concentration of SARS-CoV-2 viral copies this assay can detect is 138 copies/mL. A negative result does not preclude SARS-Cov-2 infection and should not be used as the sole basis for treatment or other patient management decisions. A negative result may occur with  improper specimen collection/handling, submission of specimen other than nasopharyngeal swab, presence of viral mutation(s) within the areas targeted by this assay, and inadequate number of viral copies(<138 copies/mL). A negative result must be combined with clinical observations, patient history, and epidemiological information. The expected result is Negative.  Fact Sheet for Patients:  12/11/20  Fact Sheet for Healthcare Providers:  BloggerCourse.com  This test is no t yet approved or cleared by the [place] FDA and  has been authorized for detection and/or diagnosis of SARS-CoV-2 by FDA under an Emergency Use Authorization (EUA). This EUA will remain  in effect (meaning  this test can be used) for the duration of the COVID-19 declaration under Section 564(b)(1) of the Act, 21 U.S.C.section 360bbb-3(b)(1), unless the authorization is terminated  or revoked sooner.       Influenza A by PCR NEGATIVE NEGATIVE Final   Influenza B by PCR NEGATIVE NEGATIVE Final    Comment: (NOTE) The Xpert Xpress SARS-CoV-2/FLU/RSV plus assay is intended as an aid  in the diagnosis of influenza from Nasopharyngeal swab specimens and should not be used as a sole basis for treatment. Nasal washings and aspirates are unacceptable for Xpert Xpress SARS-CoV-2/FLU/RSV testing.  Fact Sheet for Patients: BloggerCourse.com  Fact Sheet for Healthcare Providers: SeriousBroker.it  This test is not yet approved or cleared by the [place] FDA and has been authorized for detection and/or diagnosis of SARS-CoV-2 by FDA under an Emergency Use Authorization (EUA). This EUA will remain in effect (meaning this test can be used) for the duration of the COVID-19 declaration under Section 564(b)(1) of the Act, 21 U.S.C. section 360bbb-3(b)(1), unless the authorization is terminated or revoked.  Performed at [facility], [address] [place], [place] [address]   Blood culture (routine single)     Status: None (Preliminary result)   Collection Time: 12/09/20  4:34 AM   Specimen: BLOOD  Result Value Ref Range Status   Specimen Description BLOOD LEFT ARM  Final   Special Requests   Final    BOTTLES DRAWN AEROBIC AND ANAEROBIC Blood Culture adequate volume   Culture   Final    NO GROWTH < 12 HOURS Performed at [facility], [address] [place], [place] [address]    Report Status PENDING  Incomplete     Radiological Exams on Admission: NM Pulmonary Perfusion  Result Date: 12/09/2020 CLINICAL DATA:  Pulmonary embolism suspected. Low intermediate probability. Elevated D-dimer levels. Negative lower  extremity Doppler ultrasound for DVT. EXAM: NUCLEAR MEDICINE PERFUSION LUNG SCAN TECHNIQUE: Perfusion images were obtained in multiple projections after intravenous injection of radiopharmaceutical. Ventilation scans intentionally deferred if perfusion scan and chest x-ray adequate for interpretation during COVID 19 epidemic. RADIOPHARMACEUTICALS:  4.52 mCi Tc-62m MAA IV COMPARISON:  Portable chest 12/09/2020. Lower extremity Doppler ultrasound 12/09/2020. FINDINGS: There are no segmental perfusion defects to suggest pulmonary embolism. The heart is enlarged. There is mildly decreased perfusion to the left lung base, corresponding with a probable small left pleural effusion or pleural thickening on radiography. IMPRESSION: No evidence of pulmonary embolism. Electronically Signed   By: Carey Bullocks M.D.   On: 12/09/2020 12:48   US RENAL  Result Date: 12/09/2020 CLINICAL DATA:  Acute kidney injury EXAM: RENAL / URINARY TRACT ULTRASOUND COMPLETE COMPARISON:  None. FINDINGS: Right Kidney: Renal measurements: 10.4 x 5.0 x 5.4 cm = volume: 147.18 mL. Echogenicity within normal limits. No mass or hydronephrosis visualized. Left Kidney: Renal measurements: 11.5 x 5.3 x 3.9 cm = volume: 123.9 mL. Upper pole cyst measures 1.8 x 1.2 x 1.3 cm. Inferior pole cyst measures 1.5 x 1.1 x 1.3 cm Echogenicity within normal limits. No mass or hydronephrosis. Bladder: Appears normal for degree of bladder distention. Other: None. IMPRESSION: 1. Echogenicity within normal limits. No mass or hydronephrosis visualized. 2. Left kidney cysts. Electronically Signed   By: Signa Kell M.D.   On: 12/09/2020 09:31   US Venous Img Lower Bilateral (DVT)  Result Date: 12/09/2020 CLINICAL DATA:  Elevated D-dimer. Shortness of breath. Evaluate for DVT. EXAM: BILATERAL LOWER EXTREMITY VENOUS DOPPLER ULTRASOUND TECHNIQUE: Gray-scale sonography with graded compression, as well as color Doppler and duplex ultrasound were performed to evaluate  the lower extremity deep venous systems from the level of the common femoral vein and including the common femoral, femoral, profunda femoral, popliteal and calf veins including the posterior tibial, peroneal and gastrocnemius veins when visible. The superficial great saphenous vein was also interrogated. Spectral Doppler was utilized to evaluate flow at rest and with distal augmentation  maneuvers in the common femoral, femoral and popliteal veins. COMPARISON:  None. FINDINGS: RIGHT LOWER EXTREMITY Common Femoral Vein: No evidence of thrombus. Normal compressibility, respiratory phasicity and response to augmentation. Saphenofemoral Junction: No evidence of thrombus. Normal compressibility and flow on color Doppler imaging. Profunda Femoral Vein: No evidence of thrombus. Normal compressibility and flow on color Doppler imaging. Femoral Vein: No evidence of thrombus. Normal compressibility, respiratory phasicity and response to augmentation. Popliteal Vein: No evidence of thrombus. Normal compressibility, respiratory phasicity and response to augmentation. Calf Veins: No evidence of thrombus. Normal compressibility and flow on color Doppler imaging. Superficial Great Saphenous Vein: No evidence of thrombus. Normal compressibility. Venous Reflux:  None. Other Findings:  None. LEFT LOWER EXTREMITY Common Femoral Vein: No evidence of thrombus. Normal compressibility, respiratory phasicity and response to augmentation. Saphenofemoral Junction: No evidence of thrombus. Normal compressibility and flow on color Doppler imaging. Profunda Femoral Vein: No evidence of thrombus. Normal compressibility and flow on color Doppler imaging. Femoral Vein: No evidence of thrombus. Normal compressibility, respiratory phasicity and response to augmentation. Popliteal Vein: No evidence of thrombus. Normal compressibility, respiratory phasicity and response to augmentation. Calf Veins: No evidence of thrombus. Normal compressibility and  flow on color Doppler imaging. Superficial Great Saphenous Vein: No evidence of thrombus. Normal compressibility. Venous Reflux:  None. Other Findings: There is an approximately 3.0 x 1.5 x 1.4 cm fluid collection with left popliteal fossa compatible with a Baker's cyst. IMPRESSION: 1. No evidence of DVT within either lower extremity. 2. Note made of an approximately 3.0 cm left-sided Baker's cyst. Electronically Signed   By: Simonne Come M.D.   On: 12/09/2020 11:25   DG Chest Port 1 View  Result Date: 12/09/2020 CLINICAL DATA:  75 year old female with possible sepsis. EXAM: PORTABLE CHEST 1 VIEW COMPARISON:  Chest radiographs 07/06/2012 and earlier. FINDINGS: Portable AP semi upright view at 0439 hours. Lower lung volumes. Possible gastric hiatal hernia which was not apparent in 2014. Otherwise stable cardiomegaly and mediastinal contours. Visualized tracheal air column is within normal limits. No pneumothorax or pulmonary edema. No consolidation or air bronchograms identified, but new blunting of the left lateral lung base is suggestive of pleural effusion. Paucity of bowel gas in the upper abdomen. Chronic degenerative and postoperative changes about the right shoulder. No acute osseous abnormality identified. IMPRESSION: 1. Evidence of a small left pleural effusion, but no other acute cardiopulmonary abnormality identified. 2. Suggestion of hiatal hernia, new since 2014. 3. Chronic cardiomegaly. Electronically Signed   By: Odessa Fleming M.D.   On: 12/09/2020 05:35     EKG: I have personally reviewed.  Sinus rhythm, QTC 533, bifascicular block, early R wave progression, PVC, PAC, T wave inversion in inferior leads V1-V3, and ST depression.  Assessment/Plan Principal Problem:   NSTEMI (non-ST elevated myocardial infarction) (HCC) Active Problems:   Chronic systolic CHF (congestive heart failure) (HCC)   Chronic back pain   Acute renal failure superimposed on stage 3a chronic kidney disease (HCC)    Depression with anxiety   Type II diabetes mellitus with renal manifestations (HCC)   CAD (coronary artery disease)   HLD (hyperlipidemia)   HTN (hypertension)   Hypokalemia   Leukocytosis   NSTEMI and history of CAD: S/p of stent placement: trop  105 --> 93 --> 93.  Patient does not have chest pain, but has generalized weakness.  Dr. Juliann Pares of cardiology is consulted.  D-dimer is positive, but VQ scan negative for PE.  Lower extremity Dopplers negative for DVT.  -  place to progressive unit for observation - IV heparin is started in ED, will continue - Trend Trop - received 324 mg of ASA - pt is on lipitor and plavix -Imdur - Risk factor stratification: will check FLP and A1C   Chronic systolic CHF (congestive heart failure) (HCC): 2D echo on 05/22/2017 showed EF of 35.  Patient has 1+ leg edema, elevated BNP 398, indicating possible fluid overload.  Since patient has worsening renal function, will not start diuretics now. -Watch volume status closely -Cardiologist recommendation  Chronic back pain -As needed Tylenol -continue home tramadol  Acute renal failure superimposed on stage 3a chronic kidney disease (HCC): Recent baseline creatinine 1.2 on 10/28/2019.  Her creatinine is at 2.02, BUN 35 -Hold Mobic, lisinopril -Gentle IV fluid, 75 cc/h of normal saline  Depression with anxiety -Continue home medications  Type II diabetes mellitus with renal manifestations [facility]): Recent A1c 5.8, well controlled.  Patient is taking 70/30 NPH insulin and glucose at home. -Sliding scale insulin -Decrease home 70/30 NPH insulin dose from 10 to 8 units twice daily  HLD (hyperlipidemia) -Lipitor  HTN (hypertension) -Hold lisinopril -IV hydralazine as needed -Imdure  Hypokalemia: K 3.4. Mg 1.8 -Repleted potassium  Leukocytosis and positive blood culture: Blood culture is positive 1 out of 2 bottles (aerobic) with BCID for MSSA. Patient does not have fever.  Lactic acid is normal.  Does  not seem to have sepsis.  So far no source of infection identified. -We will start cefazolin empirically -Follow-up of blood culture, urine culture, urinalysis     DVT ppx: on IV Heparin    Code Status: DNR per her daughter who is POA Family Communication:  Yes, patient's  daughter by phone Disposition Plan:  Anticipate discharge back to previous environment Consults called:  Dr. Juliann Pares of card Admission status and Level of care: Progressive Cardiac:    progressive unit for obs     Status is: Observation  The patient remains OBS appropriate and will d/c before 2 midnights.  Dispo: The patient is from: Home              Anticipated d/c is to: Home              Patient currently is not medically stable to d/c.   Difficult to place patient No           Date of Service 12/09/2020    Lorretta Harp [facility]   If 7PM-7AM, please contact night-coverage [email or web address] 12/09/2020, 2:20 PM

## 2020-12-09 NOTE — ED Notes (Signed)
Pt brought in from home per EMS for c/o generalized weakness since Sunday, progressively getting worse. Denies fever/CP/SOB at this time . Noted w/ cough. Per EMS , pt initially desaturated O2 sat=80's at home, hooked to nasal cannula. On arrival noted 93% SPO2 on room air

## 2020-12-09 NOTE — ED Notes (Signed)
US at bedside

## 2020-12-09 NOTE — Progress Notes (Signed)
ANTICOAGULATION CONSULT NOTE  Pharmacy Consult for heparin infusion Indication: nSTEMI/ACS  Allergies  Allergen Reactions   Sulfa Antibiotics Rash    Mouth blisters Other reaction(s): Angioedema "whole mouth swells"   Gabapentin Diarrhea    Patient Measurements: Height: 5' (152.4 cm) Weight: 102.1 kg (225 lb) IBW/kg (Calculated) : 45.5 Heparin Dosing Weight: 70.4 kg  Vital Signs: BP: 141/68 (09/21 1430) Pulse Rate: 92 (09/21 1430)  Labs: Recent Labs    12/09/20 0415 12/09/20 0616 12/09/20 0920 12/09/20 1259 12/09/20 1519 12/09/20 1558  HGB 12.3  --   --   --   --   --   HCT 37.6  --   --   --   --   --   PLT 190  --   --   --   --   --   APTT  --  28  --   --   --   --   LABPROT  --  14.1  --   --   --   --   INR  --  1.1  --   --   --   --   HEPARINUNFRC  --   --   --   --   --  0.28*  CREATININE 2.02*  --   --   --   --   --   TROPONINIHS 105* 93* 93* 113* 133*  --      Estimated Creatinine Clearance: 26.7 mL/min (A) (by C-G formula based on SCr of 2.02 mg/dL (H)).   Medical History: Past Medical History:  Diagnosis Date   Anxiety    Bell's palsy 08/2018   CHF (congestive heart failure) (HCC)    Chronic back pain    Chronic cough    CKD (chronic kidney disease), stage III (HCC)    Depression    Diabetes mellitus, type 2 (HCC)    GERD (gastroesophageal reflux disease)    Hyperlipidemia    Hypertension    Multilevel degenerative disc disease    Myocardial infarction [facility]) 2007   & 2014   Neuropathy    Osteoporosis     Assessment: Pt is 75 yo female presenting with generalized weakness, found w/ possible nSTEMI/ACS.  Goal of Therapy:  Heparin level 0.3-0.7 units/ml Monitor platelets by anticoagulation protocol: Yes   9/21  1558  HL 0.28 , subtherapeutic  Plan:  Give 1000 units bolus x 1 Increase heparin infusion to 1000 unit/hr Will check HL in 8 hrs after start of infusion CBC daily while on heparin  Ariel Dimitri Rodriguez-Guzman PharmD,  BCPS 12/09/2020 4:42 PM

## 2020-12-09 NOTE — ED Notes (Signed)
Contact: Daughter - Donnella Bi [phone]

## 2020-12-09 NOTE — Consult Note (Signed)
CARDIOLOGY CONSULT NOTE               Patient ID: Jamie Benitez MRN: [id number] DOB/AGE: [birth year] 75 y.o.  Admit date: 12/09/2020 Referring Physician Dr. Lorretta Harp Primary Physician Dr. Larwance Sachs Primary Cardiologist Reason for Consultation shortness of breath generalized weakness  HPI: Patient is a 75 year old female complains of generalized fatigue weakness shortness of breath presented with borderline troponins minimal to no chest pain has significant diabetes denies any] further syncope here for routine follow-up  Review of systems complete and found to be negative unless listed above     Past Medical History:  Diagnosis Date   Anxiety    Bell's palsy 08/2018   CHF (congestive heart failure) (HCC)    Chronic back pain    Chronic cough    CKD (chronic kidney disease), stage III (HCC)    Depression    Diabetes mellitus, type 2 (HCC)    GERD (gastroesophageal reflux disease)    Hyperlipidemia    Hypertension    Multilevel degenerative disc disease    Myocardial infarction [facility]) 2007   & 2014   Neuropathy    Osteoporosis     Past Surgical History:  Procedure Laterality Date   ABDOMINAL HYSTERECTOMY     BACK SURGERY     CARDIAC CATHETERIZATION  2007   & 2014.  stents   CATARACT EXTRACTION W/PHACO Right 09/16/2019   Procedure: CATARACT EXTRACTION PHACO AND INTRAOCULAR LENS PLACEMENT (IOC) RIGHT DIABETIC 2.12  00:27.3;  Surgeon: Nevada Crane, MD;  Location: [facility];  Service: Ophthalmology;  Laterality: Right;  Diabetic - insulin and oral meds   ROTATOR CUFF REPAIR     x2    (Not in a hospital admission)  Social History   Socioeconomic History   Marital status: Divorced    Spouse name: Not on file   Number of children: Not on file   Years of education: Not on file   Highest education level: Not on file  Occupational History   Not on file  Tobacco Use   Smoking status: Former    Types: Cigarettes    Quit date: 2014    Years since  quitting: 8.7   Smokeless tobacco: Never  Vaping Use   Vaping Use: Never used  Substance and Sexual Activity   Alcohol use: Not Currently   Drug use: Never   Sexual activity: Not on file  Other Topics Concern   Not on file  Social History Narrative   Not on file   Social Determinants of Health   Financial Resource Strain: Not on file  Food Insecurity: Not on file  Transportation Needs: Not on file  Physical Activity: Not on file  Stress: Not on file  Social Connections: Not on file  Intimate Partner Violence: Not on file    No family history on file.    Review of systems complete and found to be negative unless listed above      PHYSICAL EXAM  General: Well developed, well nourished, in no acute distress HEENT:  Normocephalic and atramatic Neck:  No JVD.  Lungs: Clear bilaterally to auscultation and percussion. Heart: HRRR . Normal S1 and S2 without gallops or murmurs.  Abdomen: Bowel sounds are positive, abdomen soft and non-tender  Msk:  Back normal, normal gait. Normal strength and tone for age. Extremities: No clubbing, cyanosis or edema.   Neuro: Alert and oriented X 3. Psych:  Good affect, responds appropriately  Labs:   Lab Results  Component Value Date   WBC 11.4 (H) 12/09/2020   HGB 12.3 12/09/2020   HCT 37.6 12/09/2020   MCV 86.0 12/09/2020   PLT 190 12/09/2020    Recent Labs  Lab 12/09/20 0415  NA 134*  K 3.4*  CL 100  CO2 22  BUN 35*  CREATININE 2.02*  CALCIUM 8.5*  PROT 6.9  BILITOT 1.2  ALKPHOS 85  ALT 24  AST 37  GLUCOSE 238*   Lab Results  Component Value Date   TROPONINI 0.06 (H) 07/07/2012   No results found for: CHOL No results found for: HDL No results found for: LDLCALC No results found for: TRIG No results found for: CHOLHDL No results found for: LDLDIRECT    Radiology: US RENAL  Result Date: 12/09/2020 CLINICAL DATA:  Acute kidney injury EXAM: RENAL / URINARY TRACT ULTRASOUND COMPLETE COMPARISON:  None.  FINDINGS: Right Kidney: Renal measurements: 10.4 x 5.0 x 5.4 cm = volume: 147.18 mL. Echogenicity within normal limits. No mass or hydronephrosis visualized. Left Kidney: Renal measurements: 11.5 x 5.3 x 3.9 cm = volume: 123.9 mL. Upper pole cyst measures 1.8 x 1.2 x 1.3 cm. Inferior pole cyst measures 1.5 x 1.1 x 1.3 cm Echogenicity within normal limits. No mass or hydronephrosis. Bladder: Appears normal for degree of bladder distention. Other: None. IMPRESSION: 1. Echogenicity within normal limits. No mass or hydronephrosis visualized. 2. Left kidney cysts. Electronically Signed   By: Signa Kell M.D.   On: 12/09/2020 09:31   US Venous Img Lower Bilateral (DVT)  Result Date: 12/09/2020 CLINICAL DATA:  Elevated D-dimer. Shortness of breath. Evaluate for DVT. EXAM: BILATERAL LOWER EXTREMITY VENOUS DOPPLER ULTRASOUND TECHNIQUE: Gray-scale sonography with graded compression, as well as color Doppler and duplex ultrasound were performed to evaluate the lower extremity deep venous systems from the level of the common femoral vein and including the common femoral, femoral, profunda femoral, popliteal and calf veins including the posterior tibial, peroneal and gastrocnemius veins when visible. The superficial great saphenous vein was also interrogated. Spectral Doppler was utilized to evaluate flow at rest and with distal augmentation maneuvers in the common femoral, femoral and popliteal veins. COMPARISON:  None. FINDINGS: RIGHT LOWER EXTREMITY Common Femoral Vein: No evidence of thrombus. Normal compressibility, respiratory phasicity and response to augmentation. Saphenofemoral Junction: No evidence of thrombus. Normal compressibility and flow on color Doppler imaging. Profunda Femoral Vein: No evidence of thrombus. Normal compressibility and flow on color Doppler imaging. Femoral Vein: No evidence of thrombus. Normal compressibility, respiratory phasicity and response to augmentation. Popliteal Vein: No evidence  of thrombus. Normal compressibility, respiratory phasicity and response to augmentation. Calf Veins: No evidence of thrombus. Normal compressibility and flow on color Doppler imaging. Superficial Great Saphenous Vein: No evidence of thrombus. Normal compressibility. Venous Reflux:  None. Other Findings:  None. LEFT LOWER EXTREMITY Common Femoral Vein: No evidence of thrombus. Normal compressibility, respiratory phasicity and response to augmentation. Saphenofemoral Junction: No evidence of thrombus. Normal compressibility and flow on color Doppler imaging. Profunda Femoral Vein: No evidence of thrombus. Normal compressibility and flow on color Doppler imaging. Femoral Vein: No evidence of thrombus. Normal compressibility, respiratory phasicity and response to augmentation. Popliteal Vein: No evidence of thrombus. Normal compressibility, respiratory phasicity and response to augmentation. Calf Veins: No evidence of thrombus. Normal compressibility and flow on color Doppler imaging. Superficial Great Saphenous Vein: No evidence of thrombus. Normal compressibility. Venous Reflux:  None. Other Findings: There is an approximately 3.0 x 1.5 x 1.4 cm fluid collection with left  popliteal fossa compatible with a Baker's cyst. IMPRESSION: 1. No evidence of DVT within either lower extremity. 2. Note made of an approximately 3.0 cm left-sided Baker's cyst. Electronically Signed   By: Simonne Come M.D.   On: 12/09/2020 11:25   DG Chest Port 1 View  Result Date: 12/09/2020 CLINICAL DATA:  75 year old female with possible sepsis. EXAM: PORTABLE CHEST 1 VIEW COMPARISON:  Chest radiographs 07/06/2012 and earlier. FINDINGS: Portable AP semi upright view at 0439 hours. Lower lung volumes. Possible gastric hiatal hernia which was not apparent in 2014. Otherwise stable cardiomegaly and mediastinal contours. Visualized tracheal air column is within normal limits. No pneumothorax or pulmonary edema. No consolidation or air bronchograms  identified, but new blunting of the left lateral lung base is suggestive of pleural effusion. Paucity of bowel gas in the upper abdomen. Chronic degenerative and postoperative changes about the right shoulder. No acute osseous abnormality identified. IMPRESSION: 1. Evidence of a small left pleural effusion, but no other acute cardiopulmonary abnormality identified. 2. Suggestion of hiatal hernia, new since 2014. 3. Chronic cardiomegaly. Electronically Signed   By: Odessa Fleming M.D.   On: 12/09/2020 05:35    EKG: Normal sinus rhythm right bundle branch block LVH nonspecific ST-T wave changes  ASSESSMENT AND PLAN:  NSTEMI possible Acute on chronic renal insufficiency Chronic systolic congestive heart failured Hyperlipidemia Hypertension Diabetes  Plan [place] troponins demand ischemia this is not a non-STEMI: Heparin IV 24 to 48 hours then discontinue ruled out Continue aspirin 81 mg daily And Plavix therapy daily as well as statin therapy with Lipitor Continue diabetes management and control Correct electrolytes Kieth Hartis D Kavish Lafitte MD 12/09/2020, 12:41 PM

## 2020-12-09 NOTE — ED Notes (Signed)
Reports given to next shift RN ... to start heparin

## 2020-12-09 NOTE — ED Notes (Signed)
Patient repositioned and undressed out of street clothes into hospital gown.

## 2020-12-09 NOTE — ED Notes (Signed)
Received call from lab, Troponin=105,  ERMD made aware

## 2020-12-09 NOTE — ED Notes (Signed)
Called lab to include BNP test for this pt

## 2020-12-09 NOTE — ED Notes (Signed)
Patient transported to [facility] 

## 2020-12-09 NOTE — Progress Notes (Signed)
PHARMACY - PHYSICIAN COMMUNICATION CRITICAL VALUE ALERT - BLOOD CULTURE IDENTIFICATION (BCID)  Jamie Benitez is an 75 y.Jamie. female who presented to [facility] on 12/09/2020 with a chief complaint of weaknesss  Assessment:  1/2 bottles (aerobic) GPC, BCID for MSSA. Unclear source or if possible contaminant. If deemed true infection would recommend Cefazolin.   Name of physician (or Provider) Contacted: Dr. Clyde Lundborg  Current antibiotics: None  Changes to prescribed antibiotics recommended:  Recommendations accepted by provider - start cefazolin  Results for orders placed or performed during the hospital encounter of 12/09/20  Blood Culture ID Panel (Reflexed) (Collected: 12/09/2020  4:34 AM)  Result Value Ref Range   Enterococcus faecalis NOT DETECTED NOT DETECTED   Enterococcus Faecium NOT DETECTED NOT DETECTED   Listeria monocytogenes NOT DETECTED NOT DETECTED   Staphylococcus species DETECTED (A) NOT DETECTED   Staphylococcus aureus (BCID) DETECTED (A) NOT DETECTED   Staphylococcus epidermidis NOT DETECTED NOT DETECTED   Staphylococcus lugdunensis NOT DETECTED NOT DETECTED   Streptococcus species NOT DETECTED NOT DETECTED   Streptococcus agalactiae NOT DETECTED NOT DETECTED   Streptococcus pneumoniae NOT DETECTED NOT DETECTED   Streptococcus pyogenes NOT DETECTED NOT DETECTED   A.calcoaceticus-baumannii NOT DETECTED NOT DETECTED   Bacteroides fragilis NOT DETECTED NOT DETECTED   Enterobacterales NOT DETECTED NOT DETECTED   Enterobacter cloacae complex NOT DETECTED NOT DETECTED   Escherichia coli NOT DETECTED NOT DETECTED   Klebsiella aerogenes NOT DETECTED NOT DETECTED   Klebsiella oxytoca NOT DETECTED NOT DETECTED   Klebsiella pneumoniae NOT DETECTED NOT DETECTED   Proteus species NOT DETECTED NOT DETECTED   Salmonella species NOT DETECTED NOT DETECTED   Serratia marcescens NOT DETECTED NOT DETECTED   Haemophilus influenzae NOT DETECTED NOT DETECTED   Neisseria meningitidis NOT  DETECTED NOT DETECTED   Pseudomonas aeruginosa NOT DETECTED NOT DETECTED   Stenotrophomonas maltophilia NOT DETECTED NOT DETECTED   Candida albicans NOT DETECTED NOT DETECTED   Candida auris NOT DETECTED NOT DETECTED   Candida glabrata NOT DETECTED NOT DETECTED   Candida krusei NOT DETECTED NOT DETECTED   Candida parapsilosis NOT DETECTED NOT DETECTED   Candida tropicalis NOT DETECTED NOT DETECTED   Cryptococcus neoformans/gattii NOT DETECTED NOT DETECTED   Meth resistant mecA/C and MREJ NOT DETECTED NOT DETECTED    Jamie Benitez Jamie Benitez 12/09/2020  5:59 PM

## 2020-12-09 NOTE — ED Provider Notes (Signed)
[facility] Emergency Department Provider Note  ____________________________________________   Event Date/Time   First MD Initiated Contact with Patient 12/09/20 0411     (approximate)  I have reviewed the triage vital signs and the nursing notes.   HISTORY  Chief Complaint Weakness (Weakness since Sunday, progressively worse )    HPI Jamie Benitez is a 75 y.o. female who presents by EMS for evaluation of weakness which has been steadily worsening over the last 3 days.  She said that she feels sick but other than being weak and having worsening low back pain than usual (she has a diagnosis of chronic back pain), she is not reporting any symptoms.  She is specifically denies chest pain or shortness of breath, however EMS noted that she had an oxygen saturation in the 80s and that she was breathing quickly.  They placed her on oxygen by nasal cannula at 2 to 3 L which improved her oxygenation up to the 90s.  However she denies ever feeling short of breath and repeatedly denies having chest pain.  The patient does have an extensive cardiac history and has had 2 cardiac stents in the past.  She denies any recent fever, sore throat, chest pain, shortness of breath, nausea, vomiting, and abdominal pain.  She said the aching in her back has become severe but she has not had any recent falls.  She said that she has still been getting up and moving around over the last 3 days and spite of her generalized weakness.  She has no focal weakness nor numbness in her arms or or her legs.  Nothing particular makes the symptoms better and exertion makes her feel worse.  She said that she is vaccinated for COVID-19.     Past Medical History:  Diagnosis Date   Anxiety    Bell's palsy 08/2018   CHF (congestive heart failure) (HCC)    Chronic back pain    Chronic cough    CKD (chronic kidney disease), stage III (HCC)    Depression    Diabetes mellitus, type 2 (HCC)    GERD  (gastroesophageal reflux disease)    Hyperlipidemia    Hypertension    Multilevel degenerative disc disease    Myocardial infarction [facility]) 2007   & 2014   Neuropathy    Osteoporosis     Patient Active Problem List   Diagnosis Date Noted   NSTEMI (non-ST elevated myocardial infarction) (HCC) 12/09/2020   Chronic systolic CHF (congestive heart failure) (HCC) 12/09/2020   Chronic back pain    Acute renal failure superimposed on stage 3a chronic kidney disease (HCC)    Depression with anxiety    Type II diabetes mellitus with renal manifestations (HCC)    CAD (coronary artery disease)    HLD (hyperlipidemia)    HTN (hypertension)    Hypokalemia    Leukocytosis      Past Surgical History:  Procedure Laterality Date   ABDOMINAL HYSTERECTOMY     BACK SURGERY     CARDIAC CATHETERIZATION  2007   & 2014.  stents   CATARACT EXTRACTION W/PHACO Right 09/16/2019   Procedure: CATARACT EXTRACTION PHACO AND INTRAOCULAR LENS PLACEMENT (IOC) RIGHT DIABETIC 2.12  00:27.3;  Surgeon: Nevada Crane, MD;  Location: [facility];  Service: Ophthalmology;  Laterality: Right;  Diabetic - insulin and oral meds   ROTATOR CUFF REPAIR     x2    Prior to Admission medications   Medication Sig Start Date  End Date Taking? Authorizing Provider  atorvastatin (LIPITOR) 80 MG tablet Take 80 mg by mouth daily.    [provider]  buPROPion (WELLBUTRIN SR) 150 MG 12 hr tablet Take 150 mg by mouth daily.    [provider]  clopidogrel (PLAVIX) 75 MG tablet Take 75 mg by mouth daily.    [provider]  glipiZIDE (GLUCOTROL XL) 2.5 MG 24 hr tablet Take 2.5 mg by mouth daily with breakfast.    [provider]  hydrOXYzine (ATARAX/VISTARIL) 50 MG tablet Take 50 mg by mouth daily.    [provider]  insulin NPH-regular Human (70-30) 100 UNIT/ML injection Inject 10 Units into the skin 2 (two) times daily with a meal.    [provider]  isosorbide  mononitrate (IMDUR) 30 MG 24 hr tablet Take 30 mg by mouth daily.    [provider]  lisinopril (ZESTRIL) 5 MG tablet Take 10 mg by mouth daily.    [provider]  meloxicam (MOBIC) 15 MG tablet Take 15 mg by mouth daily.    [provider]  mupirocin ointment (BACTROBAN) 2 % Place 1 application into the nose 2 (two) times daily as needed.    [provider]  omeprazole (PRILOSEC) 40 MG capsule Take 40 mg by mouth daily.    [provider]  traMADol (ULTRAM) 50 MG tablet Take by mouth daily.    [provider]  traZODone (DESYREL) 50 MG tablet Take 50 mg by mouth at bedtime.    [provider]    Allergies Gabapentin and Sulfa antibiotics  No family history on file.  Social History Social History   Tobacco Use   Smoking status: Former    Types: Cigarettes    Quit date: 2014    Years since quitting: 8.7   Smokeless tobacco: Never  Vaping Use   Vaping Use: Never used  Substance Use Topics   Alcohol use: Not Currently   Drug use: Never    Review of Systems Constitutional: No fever/chills.  General malaise and generalized weakness. Eyes: No visual changes. ENT: No sore throat. Cardiovascular: Denies chest pain. Respiratory: Patient denies shortness of breath, but she was reportedly hypoxemic in the field by EMS and tachypneic. Gastrointestinal: No abdominal pain.  No nausea, no vomiting.  No diarrhea.  No constipation. Genitourinary: Negative for dysuria. Musculoskeletal: Negative for neck pain.  Negative for back pain. Integumentary: Negative for rash. Neurological: Negative for headaches, focal weakness or numbness.   ____________________________________________   PHYSICAL EXAM:  VITAL SIGNS: ED Triage Vitals  Enc Vitals Group     BP 12/09/20 0353 123/90     Pulse --      Resp 12/09/20 0353 (!) 23     Temp 12/09/20 0353 97.9 F (36.6 C)     Temp Source 12/09/20 0353 Oral     SpO2 12/09/20 0353 93 %      Weight 12/09/20 0354 102.1 kg (225 lb)     Height 12/09/20 0354 1.524 m (5')     Head Circumference --      Peak Flow --      Pain Score 12/09/20 0344 0     Pain Loc --      Pain Edu? --      Excl. in GC? --     Constitutional: Alert and oriented.  Eyes: Conjunctivae are normal.  Head: Atraumatic. Nose: No congestion/rhinnorhea. Mouth/Throat: Patient is wearing a mask. Neck: No stridor.  No meningeal signs.  Cardiovascular: Normal rate, regular rhythm. Good peripheral circulation. Respiratory: Increased respiratory rate but no accessory muscle usage nor retractions.  Moving good air, no coarse breath sounds nor wheezing. Gastrointestinal: Soft and nontender. No distention.  Musculoskeletal: No lower extremity tenderness nor edema. No gross deformities of extremities. Neurologic:  Normal speech and language. No gross focal neurologic deficits are appreciated.  Skin:  Skin is warm, dry and intact. Psychiatric: Mood and affect are normal. Speech and behavior are normal.  ____________________________________________   LABS (all labs ordered are listed, but only abnormal results are displayed)  Labs Reviewed  COMPREHENSIVE METABOLIC PANEL - Abnormal; Notable for the following components:      Result Value   Sodium 134 (*)    Potassium 3.4 (*)    Glucose, Bld 238 (*)    BUN 35 (*)    Creatinine, Ser 2.02 (*)    Calcium 8.5 (*)    GFR, Estimated 26 (*)    All other components within normal limits  CBC WITH DIFFERENTIAL/PLATELET - Abnormal; Notable for the following components:   WBC 11.4 (*)    Neutro Abs 9.1 (*)    Monocytes Absolute 1.3 (*)    All other components within normal limits  BRAIN NATRIURETIC PEPTIDE - Abnormal; Notable for the following components:   B Natriuretic Peptide 398.2 (*)    All other components within normal limits  CBG MONITORING, ED - Abnormal; Notable for the following components:   Glucose-Capillary 152 (*)    All other components within  normal limits  TROPONIN I (HIGH SENSITIVITY) - Abnormal; Notable for the following components:   Troponin I (High Sensitivity) 105 (*)    All other components within normal limits  TROPONIN I (HIGH SENSITIVITY) - Abnormal; Notable for the following components:   Troponin I (High Sensitivity) 93 (*)    All other components within normal limits  RESP PANEL BY RT-PCR (FLU A&B, COVID) ARPGX2  CULTURE, BLOOD (SINGLE)  URINE CULTURE  MAGNESIUM  LIPASE, BLOOD  LACTIC ACID, PLASMA  PROTIME-INR  APTT  URINALYSIS, COMPLETE (UACMP) WITH MICROSCOPIC  D-DIMER, QUANTITATIVE  HEPARIN LEVEL (UNFRACTIONATED)  HEMOGLOBIN A1C  TROPONIN I (HIGH SENSITIVITY)   ____________________________________________  EKG  ED ECG REPORT I, Loleta Rose, the attending physician, personally viewed and interpreted this ECG.  Date: 12/09/2020 EKG Time: 3:59 AM Rate: 90 Rhythm: normal sinus rhythm with premature atrial complexes QRS Axis: normal Intervals: Short PR interval, right bundle branch block and left posterior fascicular block. ST/T Wave abnormalities: Concern for some ST segment depression in lead II, III, and aVF, question reciprocal changes and V4. Narrative Interpretation: Concerning for acute ischemia but without meeting STEMI criteria. ____________________________________________  RADIOLOGY I, Loleta Rose, personally viewed and evaluated these images (plain radiographs) as part of my medical decision making, as well as reviewing the written report by the radiologist.  ED MD interpretation: Questionable small left pleural effusion, no other obvious abnormality  Official radiology report(s): DG Chest Port 1 View  Result Date: 12/09/2020 CLINICAL DATA:  75 year old female with possible sepsis. EXAM: PORTABLE CHEST 1 VIEW COMPARISON:  Chest radiographs 07/06/2012 and earlier. FINDINGS: Portable AP semi upright view at 0439 hours. Lower lung volumes. Possible gastric hiatal hernia which was not  apparent in 2014. Otherwise stable cardiomegaly and mediastinal contours. Visualized tracheal air column is within normal limits. No pneumothorax or pulmonary edema. No consolidation or air bronchograms identified, but new blunting of the left lateral lung base is suggestive of pleural effusion. Paucity of bowel gas in  the upper abdomen. Chronic degenerative and postoperative changes about the right shoulder. No acute osseous abnormality identified. IMPRESSION: 1. Evidence of a small left pleural effusion, but no other acute cardiopulmonary abnormality identified. 2. Suggestion of hiatal hernia, new since 2014. 3. Chronic cardiomegaly. Electronically Signed   By: Odessa Fleming M.D.   On: 12/09/2020 05:35    ____________________________________________   PROCEDURES   Procedure(s) performed (including Critical Care):  .1-3 Lead EKG Interpretation Performed by: Loleta Rose, MD Authorized by: Loleta Rose, MD     Interpretation: normal     ECG rate:  90   ECG rate assessment: normal     Rhythm: sinus rhythm     Ectopy: none     Conduction: normal   .Critical Care Performed by: Loleta Rose, MD Authorized by: Loleta Rose, MD   Critical care provider statement:    Critical care time (minutes):  40   Critical care time was exclusive of:  Separately billable procedures and treating other patients   Critical care was necessary to treat or prevent imminent or life-threatening deterioration of the following conditions:  Cardiac failure   Critical care was time spent personally by me on the following activities:  Development of treatment plan with patient or surrogate, discussions with consultants, evaluation of patient's response to treatment, examination of patient, obtaining history from patient or surrogate, ordering and performing treatments and interventions, ordering and review of laboratory studies, ordering and review of radiographic studies, pulse oximetry, re-evaluation of patient's  condition and review of old charts   ____________________________________________   INITIAL IMPRESSION / MDM / ASSESSMENT AND PLAN / ED COURSE  As part of my medical decision making, I reviewed the following data within the electronic MEDICAL RECORD NUMBER Nursing notes reviewed and incorporated, Labs reviewed , EKG interpreted , Old EKG reviewed, Old chart reviewed, Discussed with cardiology (Dr. Cristal Deer End), Discussed with admitting physician (Dr. Clyde Lundborg), and reviewed Notes from prior ED visits   Differential diagnosis includes, but is not limited to, COVID-19, pneumonia, CHF, COPD, ACS, PE, electrolyte or metabolic abnormality.  The patient is on the cardiac monitor to evaluate for evidence of arrhythmia and/or significant heart rate changes.  Her EKG is notable for significant change from the prior EKG in the system but that was from 15 years ago.  It is abnormal but does not meet STEMI criteria, and she repeatedly is denying having any chest pain at all.  She is also denying shortness of breath even though reportedly she was hypoxemic in the field but it is unclear if this was true hypoxemia or an issue with the measurement.  She said she just does not feel well in general but is having no other anginal equivalents.  Lab work pending.  Chest x-ray pending.  No indication for advanced imaging at this time.  Patient is stable and in no distress.     Clinical Course as of 12/09/20 0806  Wed Dec 09, 2020  0527 Lactic acid is normal, CBC is almost normal with only a very mild leukocytosis of 11.4.  Troponin and metabolic panel still pending. [CF]  0529 B Natriuretic Peptide(!): 398.2 [CF]  0532 Patient has been on room air since arriving to the ED and she is satting 94% [CF]  0532 Patient still having some back pain but continuing to deny chest pain and shortness of breath.  I am ordering morphine 4 mg IV to help with the discomfort in her back in the uncomfortable stretcher while we are  further evaluating her. [CF]  0543 DG Chest [facility] I personally reviewed the patient's imaging and agree with the radiologist's interpretation that there may be a small pleural effusion but it is minimal.  No other evidence of acute or emergent abnormality. [CF]  5956 Troponin I (High Sensitivity)(!!): 105 Troponin elevation at 105.  I reassessed the patient and she said that she feels much better after the morphine with an improvement in her back pain.  She continues to deny chest pain and shortness of breath.  Given the cardiac history, EKG changes, and elevated troponin, I will touch base with Dr. Okey Dupre who is on-call for STEMI, but I continue to believe that this does not meet criteria for a STEMI. [CF]  0622 Comprehensive metabolic panel(!) Comprehensive metabolic panel is notable for a creatinine of 2 which seems to be new for her as well as a BUN of 35.  This could contribute to an elevated troponin although not probably in this range.  Additionally her BNP is slightly elevated as well.  CBC generally unremarkable. [CF]  3875 Of note, Dr. Okey Dupre recommended we check a D-dimer, understanding that it will most likely be elevated in this elderly patient with multiple comorbidities.  However, he felt like it was worth checking because if it is negative or within normal limits, that is reassuring that she does not have a PE.  However she is not currently eligible for a CTA chest given her acute renal failure.  She is already receiving heparin bolus plus infusion. [CF]    Clinical Course User Index [CF] Loleta Rose, MD     ____________________________________________  FINAL CLINICAL IMPRESSION(S) / ED DIAGNOSES  Final diagnoses:  Generalized weakness  Acute renal failure, unspecified acute renal failure type (HCC)  Demand ischemia (HCC)  Elevated troponin level     MEDICATIONS GIVEN DURING THIS VISIT:  Medications  heparin ADULT infusion 100 units/mL (25000 units/269mL) (900  Units/hr Intravenous New Bag/Given 12/09/20 0726)  albuterol (PROVENTIL) (2.5 MG/3ML) 0.083% nebulizer solution 2.5 mg (has no administration in time range)  dextromethorphan-guaiFENesin (MUCINEX DM) 30-600 MG per 12 hr tablet 1 tablet (has no administration in time range)  acetaminophen (TYLENOL) tablet 650 mg (has no administration in time range)  hydrALAZINE (APRESOLINE) injection 5 mg (has no administration in time range)  insulin aspart (novoLOG) injection 0-5 Units (has no administration in time range)  insulin aspart (novoLOG) injection 0-9 Units (has no administration in time range)  potassium chloride SA (KLOR-CON) CR tablet 40 mEq (has no administration in time range)  morphine 4 MG/ML injection 4 mg (4 mg Intravenous Given 12/09/20 0602)  ondansetron (ZOFRAN) injection 4 mg (4 mg Intravenous Given 12/09/20 0601)  aspirin chewable tablet 324 mg (324 mg Oral Given 12/09/20 0628)  heparin bolus via infusion 4,000 Units (4,000 Units Intravenous Bolus from Bag 12/09/20 0727)     ED Discharge Orders     None        Note:  This document was prepared using Dragon voice recognition software and may include unintentional dictation errors.   Loleta Rose, MD 12/09/20 [phone]

## 2020-12-10 DIAGNOSIS — Z6841 Body Mass Index (BMI) 40.0 and over, adult: Secondary | ICD-10-CM | POA: Diagnosis not present

## 2020-12-10 DIAGNOSIS — E669 Obesity, unspecified: Secondary | ICD-10-CM | POA: Diagnosis present

## 2020-12-10 DIAGNOSIS — I248 Other forms of acute ischemic heart disease: Secondary | ICD-10-CM | POA: Diagnosis not present

## 2020-12-10 DIAGNOSIS — Z20822 Contact with and (suspected) exposure to covid-19: Secondary | ICD-10-CM | POA: Diagnosis present

## 2020-12-10 DIAGNOSIS — R569 Unspecified convulsions: Secondary | ICD-10-CM | POA: Diagnosis not present

## 2020-12-10 DIAGNOSIS — Z7189 Other specified counseling: Secondary | ICD-10-CM | POA: Diagnosis not present

## 2020-12-10 DIAGNOSIS — M316 Other giant cell arteritis: Secondary | ICD-10-CM | POA: Diagnosis present

## 2020-12-10 DIAGNOSIS — Z66 Do not resuscitate: Secondary | ICD-10-CM | POA: Diagnosis present

## 2020-12-10 DIAGNOSIS — R4182 Altered mental status, unspecified: Secondary | ICD-10-CM | POA: Diagnosis present

## 2020-12-10 DIAGNOSIS — E860 Dehydration: Secondary | ICD-10-CM | POA: Diagnosis present

## 2020-12-10 DIAGNOSIS — B9561 Methicillin susceptible Staphylococcus aureus infection as the cause of diseases classified elsewhere: Secondary | ICD-10-CM | POA: Diagnosis not present

## 2020-12-10 DIAGNOSIS — N1831 Chronic kidney disease, stage 3a: Secondary | ICD-10-CM | POA: Diagnosis present

## 2020-12-10 DIAGNOSIS — I509 Heart failure, unspecified: Secondary | ICD-10-CM

## 2020-12-10 DIAGNOSIS — D631 Anemia in chronic kidney disease: Secondary | ICD-10-CM | POA: Diagnosis present

## 2020-12-10 DIAGNOSIS — E1169 Type 2 diabetes mellitus with other specified complication: Secondary | ICD-10-CM | POA: Diagnosis present

## 2020-12-10 DIAGNOSIS — I9589 Other hypotension: Secondary | ICD-10-CM | POA: Diagnosis present

## 2020-12-10 DIAGNOSIS — E1140 Type 2 diabetes mellitus with diabetic neuropathy, unspecified: Secondary | ICD-10-CM | POA: Diagnosis present

## 2020-12-10 DIAGNOSIS — M549 Dorsalgia, unspecified: Secondary | ICD-10-CM | POA: Diagnosis not present

## 2020-12-10 DIAGNOSIS — E871 Hypo-osmolality and hyponatremia: Secondary | ICD-10-CM | POA: Diagnosis present

## 2020-12-10 DIAGNOSIS — R531 Weakness: Secondary | ICD-10-CM | POA: Diagnosis not present

## 2020-12-10 DIAGNOSIS — I130 Hypertensive heart and chronic kidney disease with heart failure and stage 1 through stage 4 chronic kidney disease, or unspecified chronic kidney disease: Secondary | ICD-10-CM | POA: Diagnosis present

## 2020-12-10 DIAGNOSIS — R7881 Bacteremia: Secondary | ICD-10-CM

## 2020-12-10 DIAGNOSIS — Z515 Encounter for palliative care: Secondary | ICD-10-CM | POA: Diagnosis not present

## 2020-12-10 DIAGNOSIS — I214 Non-ST elevation (NSTEMI) myocardial infarction: Secondary | ICD-10-CM | POA: Diagnosis not present

## 2020-12-10 DIAGNOSIS — E1122 Type 2 diabetes mellitus with diabetic chronic kidney disease: Secondary | ICD-10-CM | POA: Diagnosis present

## 2020-12-10 DIAGNOSIS — G9341 Metabolic encephalopathy: Secondary | ICD-10-CM | POA: Diagnosis present

## 2020-12-10 DIAGNOSIS — I6389 Other cerebral infarction: Secondary | ICD-10-CM | POA: Diagnosis not present

## 2020-12-10 DIAGNOSIS — I5022 Chronic systolic (congestive) heart failure: Secondary | ICD-10-CM | POA: Diagnosis present

## 2020-12-10 DIAGNOSIS — I4891 Unspecified atrial fibrillation: Secondary | ICD-10-CM | POA: Diagnosis present

## 2020-12-10 DIAGNOSIS — E872 Acidosis, unspecified: Secondary | ICD-10-CM | POA: Diagnosis present

## 2020-12-10 DIAGNOSIS — N170 Acute kidney failure with tubular necrosis: Secondary | ICD-10-CM | POA: Diagnosis present

## 2020-12-10 DIAGNOSIS — N179 Acute kidney failure, unspecified: Secondary | ICD-10-CM | POA: Diagnosis not present

## 2020-12-10 DIAGNOSIS — F32A Depression, unspecified: Secondary | ICD-10-CM | POA: Diagnosis present

## 2020-12-10 LAB — BASIC METABOLIC PANEL
Anion gap: 16 — ABNORMAL HIGH (ref 5–15)
BUN: 57 mg/dL — ABNORMAL HIGH (ref 8–23)
CO2: 18 mmol/L — ABNORMAL LOW (ref 22–32)
Calcium: 8 mg/dL — ABNORMAL LOW (ref 8.9–10.3)
Chloride: 98 mmol/L (ref 98–111)
Creatinine, Ser: 4.21 mg/dL — ABNORMAL HIGH (ref 0.44–1.00)
GFR, Estimated: 11 mL/min — ABNORMAL LOW (ref >60)
Glucose, Bld: 113 mg/dL — ABNORMAL HIGH (ref 70–99)
Potassium: 3.8 mmol/L (ref 3.5–5.1)
Sodium: 132 mmol/L — ABNORMAL LOW (ref 135–145)

## 2020-12-10 LAB — LIPID PANEL
Cholesterol: 94 mg/dL (ref 0–200)
HDL: 33 mg/dL — ABNORMAL LOW (ref >40)
LDL Cholesterol: 37 mg/dL (ref 0–99)
Total CHOL/HDL Ratio: 2.8 RATIO
Triglycerides: 120 mg/dL (ref <150)
VLDL: 24 mg/dL (ref 0–40)

## 2020-12-10 LAB — TROPONIN I (HIGH SENSITIVITY): Troponin I (High Sensitivity): 156 ng/L (ref <18)

## 2020-12-10 LAB — HEMOGLOBIN A1C
Hgb A1c MFr Bld: 5.8 % — ABNORMAL HIGH (ref 4.8–5.6)
Mean Plasma Glucose: 120 mg/dL

## 2020-12-10 LAB — CBC
HCT: 33 % — ABNORMAL LOW (ref 36.0–46.0)
Hemoglobin: 10.7 g/dL — ABNORMAL LOW (ref 12.0–15.0)
MCH: 27.3 pg (ref 26.0–34.0)
MCHC: 32.4 g/dL (ref 30.0–36.0)
MCV: 84.2 fL (ref 80.0–100.0)
Platelets: 150 10*3/uL (ref 150–400)
RBC: 3.92 MIL/uL (ref 3.87–5.11)
RDW: 15.5 % (ref 11.5–15.5)
WBC: 13.9 10*3/uL — ABNORMAL HIGH (ref 4.0–10.5)
nRBC: 0 % (ref 0.0–0.2)

## 2020-12-10 LAB — CBG MONITORING, ED
Glucose-Capillary: 114 mg/dL — ABNORMAL HIGH (ref 70–99)
Glucose-Capillary: 119 mg/dL — ABNORMAL HIGH (ref 70–99)

## 2020-12-10 LAB — HEPARIN LEVEL (UNFRACTIONATED)
Heparin Unfractionated: 0.33 IU/mL (ref 0.30–0.70)
Heparin Unfractionated: 0.33 IU/mL (ref 0.30–0.70)

## 2020-12-10 LAB — GLUCOSE, CAPILLARY: Glucose-Capillary: 114 mg/dL — ABNORMAL HIGH (ref 70–99)

## 2020-12-10 MED ORDER — SODIUM CHLORIDE 0.9 % IV BOLUS
500.0000 mL | Freq: Once | INTRAVENOUS | Status: AC
  Administered 2020-12-10: 500 mL via INTRAVENOUS

## 2020-12-10 MED ORDER — MIDODRINE HCL 5 MG PO TABS
10.0000 mg | ORAL_TABLET | Freq: Once | ORAL | Status: AC
  Administered 2020-12-10: 10 mg via ORAL
  Filled 2020-12-10: qty 2

## 2020-12-10 MED ORDER — METHOCARBAMOL 500 MG PO TABS
500.0000 mg | ORAL_TABLET | Freq: Four times a day (QID) | ORAL | Status: DC | PRN
  Administered 2020-12-10 – 2020-12-19 (×8): 500 mg via ORAL
  Filled 2020-12-10 (×12): qty 1

## 2020-12-10 MED ORDER — METOPROLOL TARTRATE 5 MG/5ML IV SOLN
5.0000 mg | INTRAVENOUS | Status: DC | PRN
  Administered 2020-12-10 – 2020-12-16 (×2): 5 mg via INTRAVENOUS
  Filled 2020-12-10 (×2): qty 5

## 2020-12-10 MED ORDER — AMIODARONE HCL IN DEXTROSE 360-4.14 MG/200ML-% IV SOLN
30.0000 mg/h | INTRAVENOUS | Status: DC
  Administered 2020-12-11 – 2020-12-14 (×8): 30 mg/h via INTRAVENOUS
  Filled 2020-12-10 (×7): qty 200

## 2020-12-10 MED ORDER — HEPARIN (PORCINE) 25000 UT/250ML-% IV SOLN
1300.0000 [IU]/h | INTRAVENOUS | Status: DC
  Administered 2020-12-10: 1000 [IU]/h via INTRAVENOUS
  Administered 2020-12-11: 1300 [IU]/h via INTRAVENOUS
  Administered 2020-12-11: 1000 [IU]/h via INTRAVENOUS
  Administered 2020-12-12: 1300 [IU]/h via INTRAVENOUS
  Filled 2020-12-10 (×4): qty 250

## 2020-12-10 MED ORDER — LIDOCAINE 5 % EX PTCH
1.0000 | MEDICATED_PATCH | CUTANEOUS | Status: DC
  Administered 2020-12-10 – 2020-12-18 (×9): 1 via TRANSDERMAL
  Filled 2020-12-10 (×11): qty 1

## 2020-12-10 MED ORDER — AMIODARONE LOAD VIA INFUSION
150.0000 mg | Freq: Once | INTRAVENOUS | Status: AC
  Administered 2020-12-10: 150 mg via INTRAVENOUS
  Filled 2020-12-10: qty 83.34

## 2020-12-10 MED ORDER — HYDROMORPHONE HCL 1 MG/ML IJ SOLN
0.5000 mg | INTRAMUSCULAR | Status: DC | PRN
Start: 2020-12-10 — End: 2020-12-10

## 2020-12-10 MED ORDER — AMIODARONE HCL IN DEXTROSE 360-4.14 MG/200ML-% IV SOLN
60.0000 mg/h | INTRAVENOUS | Status: AC
  Administered 2020-12-10 (×2): 60 mg/h via INTRAVENOUS
  Filled 2020-12-10 (×2): qty 200

## 2020-12-10 MED ORDER — DILTIAZEM HCL 25 MG/5ML IV SOLN
10.0000 mg | Freq: Once | INTRAVENOUS | Status: AC
  Administered 2020-12-10: 10 mg via INTRAVENOUS
  Filled 2020-12-10: qty 5

## 2020-12-10 NOTE — Evaluation (Signed)
Occupational Therapy Evaluation Patient Details Name: Jamie Benitez MRN: [id number] DOB: September 20, 1945 Today's Date: 12/10/2020   History of Present Illness 75 y.o. female with medical history significant of sCHF with EF 35%, CAD with stent placement, CKD-3A, chronic back pain, Bell's palsy, HTN, hyperlipidemia, diabetes mellitus, GERD, depression with anxiety, who presents with weakness   Clinical Impression   Patient presenting with decreased I in self care, balance, functional mobility/transfers, endurance, and safety awareness. Patient reports living at home with grandson and being independently without use of AD.Grandson works during the day. Patient endorses L eye blindness. Pt's evaluation limitied by back pain. RN called for pain medications. Pt asking for drink on tray and when bed increased to 30 degrees so she could safely drink she began crying out. Pt started moving LEs towards EOB but unable to tolerate coming to EOB and declined further. Patient will benefit from acute OT to increase overall independence in the areas of ADLs, functional mobility, and safety awareness in order to safely discharge to next venue of care.      Recommendations for follow up therapy are one component of a multi-disciplinary discharge planning process, led by the attending physician.  Recommendations may be updated based on patient status, additional functional criteria and insurance authorization.   Follow Up Recommendations  SNF;Supervision/Assistance - 24 hour    Equipment Recommendations  Other (comment) (defer to next venue of care)       Precautions / Restrictions Precautions Precautions: Fall      Mobility Bed Mobility Overal bed mobility: Needs Assistance Bed Mobility: Rolling Rolling: Max assist         General bed mobility comments: attempted to get LEs over EOB but pt crying out in pain from back and declined to attempt further even with encouragement    Transfers                  General transfer comment: pt refusal        ADL either performed or assessed with clinical judgement   ADL Overall ADL's : Needs assistance/impaired                                       General ADL Comments: 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 Pt needing min A for UB self care and anticipate max A for LB self care.     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 Pt reports torn rotator cuff for ~ 10+ years   Lower Extremity Assessment Lower Extremity Assessment: Generalized weakness       Communication Communication Communication: HOH   Cognition Arousal/Alertness: Awake/alert Behavior During Therapy: WFL for tasks assessed/performed Overall Cognitive Status: Within Functional Limits for tasks assessed  General Comments: increased time to process but A & O x4              Home Living Family/patient expects to be discharged to:: Private residence Living Arrangements: Other relatives Available Help at Discharge: Family;Available PRN/intermittently Type of Home: House Home Access: Stairs to enter [employer] of Steps: 3 Entrance Stairs-Rails: Right;Left Home Layout: One level     Bathroom Shower/Tub:  Tub/shower unit         Home Equipment: Environmental consultant - 2 wheels;Cane - single point          Prior Functioning/Environment Level of Independence: Independent        Comments: Pt reports living with grandson who works during the day and "is not very helpful". She reports being independent at baseline with mobility and self care tasks.        OT Problem List: Decreased strength;Decreased activity tolerance;Impaired balance (sitting and/or standing);Decreased safety awareness;Pain;Decreased knowledge of use of DME or AE      OT Treatment/Interventions: Self-care/ADL training;Therapeutic exercise;Energy conservation;DME and/or AE instruction;Therapeutic activities;Balance training;Patient/family education;Manual therapy    OT Goals(Current goals can be found in the care plan section) Acute Rehab OT Goals Patient Stated Goal: to decrease pain OT Goal Formulation: With patient Time For Goal Achievement: 12/24/20 Potential to Achieve Goals: Good ADL Goals Pt Will Perform Grooming: (P) with supervision Pt Will Perform Lower Body Dressing: (P) with supervision;sit to/from stand Pt Will Transfer to Toilet: (P) with supervision;ambulating Pt Will Perform Toileting - Clothing Manipulation and hygiene: (P) with supervision;sit to/from stand  OT Frequency: Min 2X/week   Barriers to D/C: Decreased caregiver support  Pt does not have 24/7 assist/supervison at home and is very limited secondary to pain          AM-PAC OT "6 Clicks" Daily Activity     Outcome Measure Help from another person eating meals?: A Little Help from another person taking care of personal grooming?: A Little Help from another person toileting, which includes using toliet, bedpan, or urinal?: Total Help from another person bathing (including washing, rinsing, drying)?: A Lot Help from another person to put on and taking off regular upper body clothing?: A Lot Help from another person to put on and taking off regular  lower body clothing?: Total 6 Click Score: 12   End of Session Nurse Communication: Mobility status;Patient requests pain meds  Activity Tolerance: Patient limited by pain Patient left: in bed;with call bell/phone within reach;with bed alarm set  OT Visit Diagnosis: Unsteadiness on feet (R26.81);Muscle weakness (generalized) (M62.81)                Time: 7425-9563 OT Time Calculation (min): 21 min Charges:  OT General Charges $OT Visit: 1 Visit OT Evaluation $OT Eval Moderate Complexity: 1 Mod OT Treatments $Self Care/Home Management : 8-22 mins  Jackquline Denmark, MS, OTR/L , CBIS ascom [phone]  12/10/20, 1:57 PM

## 2020-12-10 NOTE — Progress Notes (Signed)
PROGRESS NOTE    AYLEEN MCKINSTRY  ZOX:[id number] DOB: 06-20-1945 DOA: 12/09/2020 PCP: Kandyce Rud, MD    Brief Narrative:  75 y.o. female with medical history significant of sCHF with EF 35%, CAD with stent placement, CKD-3A, chronic back pain, Bell's palsy, HTN, hyperlipidemia, diabetes mellitus, GERD, depression with anxiety, who presents with weakness.   Patient states that she has generalized weakness for more than 4 days.  No unilateral numbness or tingling to extremities.  No facial droop or slurred speech.  Patient has dry cough, denies chest pain or shortness breath.  No fever or chills.  Patient states that she had diarrhea in the past several days, which has resolved.  Currently no nausea, vomiting, diarrhea or abdominal pain.  No symptoms of UTI.  Patient complains of chronic lower back pain. Initially patient had oxygen desaturation to mid 80s, which improved to 98% on room air in ED.   Initial trop 105 with ST depression and T wave inversion in inferior leads and anterior leads.  Dr. Okey Dupre of cardiology was consulted, who did not think patient had STEMI. He recommended [facility] cardiology consult. IV heparin is started in ED  Cardiology patient's presentation is inconsistent with NSTEMI.  No significant delta in troponin.  VQ scan and lower extremity duplex negative for VTE.  Patient did have elements of mild BNP elevation and small left pleural effusion possible decompensated heart failure.  Was not given diuretics due to elevated kidney function.  Subsequently patient went into a tachyarrhythmia likely atrial fibrillation which she does not have a history of.  Also creatinine worsened from 2-4.2.  Nephrology consult 5/22.   Assessment & Plan:   Principal Problem:   NSTEMI (non-ST elevated myocardial infarction) (HCC) Active Problems:   Chronic systolic CHF (congestive heart failure) (HCC)   Chronic back pain   Acute renal failure superimposed on stage 3a chronic kidney disease  (HCC)   Depression with anxiety   Type II diabetes mellitus with renal manifestations (HCC)   CAD (coronary artery disease)   HLD (hyperlipidemia)   HTN (hypertension)   Hypokalemia   Leukocytosis   Acute decompensated heart failure (HCC)  NSTEMI and history of CAD: S/p of stent placement  trop  105 --> 93 --> 93.   Patient does not have chest pain, but has generalized weakness.   Dr. Juliann Pares of cardiology is consulted.   D-dimer is positive, but VQ scan negative for PE.   Lower extremity Dopplers negative for DVT. Per cardiology patient's presentation is inconsistent with NSTEMI Plan: Will continue heparin GTT for 48 hours then stop Continue antiplatelet therapy High intensity statin Telemetry monitoring  New onset atrial fibrillation with rapid ventricular response Noted on telemetry 9/22 Ventricular rates up to 130s Plan: IV Cardizem push as needed Metoprolol push as needed Heparin GTT Telemetry   Chronic systolic CHF (congestive heart failure) (HCC) 2D echo on 05/22/2017 showed EF of 35.  Patient has 1+ leg edema, elevated BNP 398, indicating possible fluid overload.   Since patient has worsening renal function, will not start diuretics now -Watch volume status closely   Chronic back pain -As needed Tylenol -continue home tramadol   Acute renal failure superimposed on stage 3a chronic kidney disease (HCC): Recent baseline creatinine 1.2 on 10/28/2019.   Her creatinine is at 2.02, BUN 35 on admission Creatinine worsened to 4.2 on 9/22 Plan: Patient received IV fluids on admission Will not give any more now due to systolic congestive heart failure Nephrology consult requested, recommendations  appreciated Continue holding Mobic and lisinopril Avoid contrast and nonessential nephrotoxins   Depression with anxiety -Continue home medications   Type II diabetes mellitus with renal manifestations (HCC) Recent A1c 5.8, well controlled.   Patient is taking 70/30 NPH  insulin and glucose at home. -Sliding scale insulin -Decrease home 70/30 NPH insulin dose from 10 to 8 units twice daily   HLD (hyperlipidemia) -Lipitor   HTN (hypertension) -Hold lisinopril -IV hydralazine as needed -Imdure   Hypokalemia Monitor and replace as necessary Maintain K greater than 4, mag greater than 2  Possible MSSA bacteremia Unclear what this this represents infection or true contaminant Does not meet sepsis criteria No clear source identified Plan: Continue cefazolin started on admission Continue to follow cultures and sensitivities Repeat cultures today     DVT prophylaxis: Heparin GTT Code Status: DNR Family Communication: Lanier Prude [phone] on 9/22 Disposition Plan: Status is: Inpatient  Remains inpatient appropriate because:Inpatient level of care appropriate due to severity of illness  Dispo: The patient is from: Home              Anticipated d/c is to: SNF              Patient currently is not medically stable to d/c.   Difficult to place patient No       Level of care: Progressive Cardiac  Consultants:  Nephrology Cardiology  Procedures:  None  Antimicrobials:  Cefazolin   Subjective: Patient seen and examined.  Endorses lethargy.  Asleep but easily awakened.  Answers all questions appropriately.  Objective: Vitals:   12/10/20 1230 12/10/20 1300 12/10/20 1305 12/10/20 1400  BP: 105/83 (!) 104/48  (!) 98/46  Pulse: 75 69 (!) 124 (!) 128  Resp: (!) 22 (!) 25 (!) 22 (!) 23  Temp:      TempSrc:      SpO2: 96% 96% 94% 96%  Weight:      Height:        Intake/Output Summary (Last 24 hours) at 12/10/2020 1420 Last data filed at 12/09/2020 2152 Gross per 24 hour  Intake 100 ml  Output --  Net 100 ml   Filed Weights   12/09/20 0354  Weight: 102.1 kg    Examination:  General exam: Appears weak and lethargic Respiratory system: Poor respiratory effort.  Lungs clear.  Normal work of breathing.  1  L Cardiovascular system: S1-S2, tachycardic, regular rhythm, no murmurs, 1+ pitting edema BLE Gastrointestinal system: Soft, nontender, nondistended, normal bowel sounds Central nervous system: Alert and oriented. No focal neurological deficits. Extremities: Diffusely decreased power bilaterally.  Range of motion intact Skin: No rashes, lesions or ulcers Psychiatry: Judgement and insight appear normal. Mood & affect appropriate.     Data Reviewed: I have personally reviewed following labs and imaging studies  CBC: Recent Labs  Lab 12/09/20 0415 12/10/20 0721  WBC 11.4* 13.9*  NEUTROABS 9.1*  --   HGB 12.3 10.7*  HCT 37.6 33.0*  MCV 86.0 84.2  PLT 190 150   Basic Metabolic Panel: Recent Labs  Lab 12/09/20 0415 12/10/20 0721  NA 134* 132*  K 3.4* 3.8  CL 100 98  CO2 22 18*  GLUCOSE 238* 113*  BUN 35* 57*  CREATININE 2.02* 4.21*  CALCIUM 8.5* 8.0*  MG 1.8  --    GFR: Estimated Creatinine Clearance: 12.8 mL/min (A) (by C-G formula based on SCr of 4.21 mg/dL (H)). Liver Function Tests: Recent Labs  Lab 12/09/20 0415  AST 37  ALT 24  ALKPHOS 85  BILITOT 1.2  PROT 6.9  ALBUMIN 3.5   Recent Labs  Lab 12/09/20 0415  LIPASE 35   No results for input(s): AMMONIA in the last 168 hours. Coagulation Profile: Recent Labs  Lab 12/09/20 0616  INR 1.1   Cardiac Enzymes: No results for input(s): CKTOTAL, CKMB, CKMBINDEX, TROPONINI in the last 168 hours. BNP (last 3 results) No results for input(s): PROBNP in the last 8760 hours. HbA1C: Recent Labs    12/09/20 0920  HGBA1C 5.8*   CBG: Recent Labs  Lab 12/09/20 1254 12/09/20 1624 12/09/20 2130 12/10/20 0807 12/10/20 1214  GLUCAP 137* 150* 138* 114* 119*   Lipid Profile: Recent Labs    12/10/20 0721  CHOL 94  HDL 33*  LDLCALC 37  TRIG 858  CHOLHDL 2.8   Thyroid Function Tests: No results for input(s): TSH, T4TOTAL, FREET4, T3FREE, THYROIDAB in the last 72 hours. Anemia Panel: No results for  input(s): VITAMINB12, FOLATE, FERRITIN, TIBC, IRON, RETICCTPCT in the last 72 hours. Sepsis Labs: Recent Labs  Lab 12/09/20 0434  LATICACIDVEN 1.8    Recent Results (from the past 240 hour(s))  Resp Panel by RT-PCR (Flu A&B, Covid) Nasopharyngeal Swab     Status: None   Collection Time: 12/09/20  4:34 AM   Specimen: Nasopharyngeal Swab; Nasopharyngeal(NP) swabs in vial transport medium  Result Value Ref Range Status   SARS Coronavirus 2 by RT PCR NEGATIVE NEGATIVE Final    Comment: (NOTE) SARS-CoV-2 target nucleic acids are NOT DETECTED.  The SARS-CoV-2 RNA is generally detectable in upper respiratory specimens during the acute phase of infection. The lowest concentration of SARS-CoV-2 viral copies this assay can detect is 138 copies/mL. A negative result does not preclude SARS-Cov-2 infection and should not be used as the sole basis for treatment or other patient management decisions. A negative result may occur with  improper specimen collection/handling, submission of specimen other than nasopharyngeal swab, presence of viral mutation(s) within the areas targeted by this assay, and inadequate number of viral copies(<138 copies/mL). A negative result must be combined with clinical observations, patient history, and epidemiological information. The expected result is Negative.  Fact Sheet for Patients:  BloggerCourse.com  Fact Sheet for Healthcare Providers:  SeriousBroker.it  This test is no t yet approved or cleared by the [place] FDA and  has been authorized for detection and/or diagnosis of SARS-CoV-2 by FDA under an Emergency Use Authorization (EUA). This EUA will remain  in effect (meaning this test can be used) for the duration of the COVID-19 declaration under Section 564(b)(1) of the Act, 21 U.S.C.section 360bbb-3(b)(1), unless the authorization is terminated  or revoked sooner.       Influenza A by PCR  NEGATIVE NEGATIVE Final   Influenza B by PCR NEGATIVE NEGATIVE Final    Comment: (NOTE) The Xpert Xpress SARS-CoV-2/FLU/RSV plus assay is intended as an aid in the diagnosis of influenza from Nasopharyngeal swab specimens and should not be used as a sole basis for treatment. Nasal washings and aspirates are unacceptable for Xpert Xpress SARS-CoV-2/FLU/RSV testing.  Fact Sheet for Patients: BloggerCourse.com  Fact Sheet for Healthcare Providers: SeriousBroker.it  This test is not yet approved or cleared by the [place] FDA and has been authorized for detection and/or diagnosis of SARS-CoV-2 by FDA under an Emergency Use Authorization (EUA). This EUA will remain in effect (meaning this test can be used) for the duration of the COVID-19 declaration under Section 564(b)(1) of the Act, 21 U.S.C. section 360bbb-3(b)(1), unless  the authorization is terminated or revoked.  Performed at [facility], [address] [place], [place] [address]   Blood culture (routine single)     Status: Abnormal (Preliminary result)   Collection Time: 12/09/20  4:34 AM   Specimen: BLOOD  Result Value Ref Range Status   Specimen Description   Final    BLOOD LEFT ARM Performed at [facility], [address], [place], [place] [address]    Special Requests   Final    BOTTLES DRAWN AEROBIC AND ANAEROBIC Blood Culture adequate volume Performed at [facility], [address] [place], [place] [address]    Culture  Setup Time   Final    GRAM POSITIVE COCCI IN BOTH AEROBIC AND ANAEROBIC BOTTLES CRITICAL RESULT CALLED TO, READ BACK BY AND VERIFIED WITH: SAMANTHA RAEUR AT 1750 ON 12/09/20 BY SS    Culture (A)  Final    STAPHYLOCOCCUS AUREUS SUSCEPTIBILITIES TO FOLLOW Performed at [facility], [address] [place], [place] [address]    Report Status PENDING  Incomplete  Blood Culture ID Panel (Reflexed)      Status: Abnormal   Collection Time: 12/09/20  4:34 AM  Result Value Ref Range Status   Enterococcus faecalis NOT DETECTED NOT DETECTED Final   Enterococcus Faecium NOT DETECTED NOT DETECTED Final   Listeria monocytogenes NOT DETECTED NOT DETECTED Final   Staphylococcus species DETECTED (A) NOT DETECTED Final    Comment: CRITICAL RESULT CALLED TO, READ BACK BY AND VERIFIED WITH: SAMANTHA RAEUR AT 1750 ON 12/09/20 BY SS    Staphylococcus aureus (BCID) DETECTED (A) NOT DETECTED Final    Comment: CRITICAL RESULT CALLED TO, READ BACK BY AND VERIFIED WITH: SAMANTHA RAEUR AT 1750 ON 12/09/20 BY SS    Staphylococcus epidermidis NOT DETECTED NOT DETECTED Final   Staphylococcus lugdunensis NOT DETECTED NOT DETECTED Final   Streptococcus species NOT DETECTED NOT DETECTED Final   Streptococcus agalactiae NOT DETECTED NOT DETECTED Final   Streptococcus pneumoniae NOT DETECTED NOT DETECTED Final   Streptococcus pyogenes NOT DETECTED NOT DETECTED Final   A.calcoaceticus-baumannii NOT DETECTED NOT DETECTED Final   Bacteroides fragilis NOT DETECTED NOT DETECTED Final   Enterobacterales NOT DETECTED NOT DETECTED Final   Enterobacter cloacae complex NOT DETECTED NOT DETECTED Final   Escherichia coli NOT DETECTED NOT DETECTED Final   Klebsiella aerogenes NOT DETECTED NOT DETECTED Final   Klebsiella oxytoca NOT DETECTED NOT DETECTED Final   Klebsiella pneumoniae NOT DETECTED NOT DETECTED Final   Proteus species NOT DETECTED NOT DETECTED Final   Salmonella species NOT DETECTED NOT DETECTED Final   Serratia marcescens NOT DETECTED NOT DETECTED Final   Haemophilus influenzae NOT DETECTED NOT DETECTED Final   Neisseria meningitidis NOT DETECTED NOT DETECTED Final   Pseudomonas aeruginosa NOT DETECTED NOT DETECTED Final   Stenotrophomonas maltophilia NOT DETECTED NOT DETECTED Final   Candida albicans NOT DETECTED NOT DETECTED Final   Candida auris NOT DETECTED NOT DETECTED Final   Candida glabrata NOT  DETECTED NOT DETECTED Final   Candida krusei NOT DETECTED NOT DETECTED Final   Candida parapsilosis NOT DETECTED NOT DETECTED Final   Candida tropicalis NOT DETECTED NOT DETECTED Final   Cryptococcus neoformans/gattii NOT DETECTED NOT DETECTED Final   Meth resistant mecA/C and MREJ NOT DETECTED NOT DETECTED Final    Comment: Performed at [facility], [address], [place], [place] [address]         Radiology Studies: NM Pulmonary Perfusion  Result Date: 12/09/2020  CLINICAL DATA:  Pulmonary embolism suspected. Low intermediate probability. Elevated D-dimer levels. Negative lower extremity Doppler ultrasound for DVT. EXAM: NUCLEAR MEDICINE PERFUSION LUNG SCAN TECHNIQUE: Perfusion images were obtained in multiple projections after intravenous injection of radiopharmaceutical. Ventilation scans intentionally deferred if perfusion scan and chest x-ray adequate for interpretation during COVID 19 epidemic. RADIOPHARMACEUTICALS:  4.52 mCi Tc-31m MAA IV COMPARISON:  Portable chest 12/09/2020. Lower extremity Doppler ultrasound 12/09/2020. FINDINGS: There are no segmental perfusion defects to suggest pulmonary embolism. The heart is enlarged. There is mildly decreased perfusion to the left lung base, corresponding with a probable small left pleural effusion or pleural thickening on radiography. IMPRESSION: No evidence of pulmonary embolism. Electronically Signed   By: Carey Bullocks M.D.   On: 12/09/2020 12:48   US RENAL  Result Date: 12/09/2020 CLINICAL DATA:  Acute kidney injury EXAM: RENAL / URINARY TRACT ULTRASOUND COMPLETE COMPARISON:  None. FINDINGS: Right Kidney: Renal measurements: 10.4 x 5.0 x 5.4 cm = volume: 147.18 mL. Echogenicity within normal limits. No mass or hydronephrosis visualized. Left Kidney: Renal measurements: 11.5 x 5.3 x 3.9 cm = volume: 123.9 mL. Upper pole cyst measures 1.8 x 1.2 x 1.3 cm. Inferior pole cyst measures 1.5 x 1.1 x 1.3 cm Echogenicity within normal  limits. No mass or hydronephrosis. Bladder: Appears normal for degree of bladder distention. Other: None. IMPRESSION: 1. Echogenicity within normal limits. No mass or hydronephrosis visualized. 2. Left kidney cysts. Electronically Signed   By: Signa Kell M.D.   On: 12/09/2020 09:31   US Venous Img Lower Bilateral (DVT)  Result Date: 12/09/2020 CLINICAL DATA:  Elevated D-dimer. Shortness of breath. Evaluate for DVT. EXAM: BILATERAL LOWER EXTREMITY VENOUS DOPPLER ULTRASOUND TECHNIQUE: Gray-scale sonography with graded compression, as well as color Doppler and duplex ultrasound were performed to evaluate the lower extremity deep venous systems from the level of the common femoral vein and including the common femoral, femoral, profunda femoral, popliteal and calf veins including the posterior tibial, peroneal and gastrocnemius veins when visible. The superficial great saphenous vein was also interrogated. Spectral Doppler was utilized to evaluate flow at rest and with distal augmentation maneuvers in the common femoral, femoral and popliteal veins. COMPARISON:  None. FINDINGS: RIGHT LOWER EXTREMITY Common Femoral Vein: No evidence of thrombus. Normal compressibility, respiratory phasicity and response to augmentation. Saphenofemoral Junction: No evidence of thrombus. Normal compressibility and flow on color Doppler imaging. Profunda Femoral Vein: No evidence of thrombus. Normal compressibility and flow on color Doppler imaging. Femoral Vein: No evidence of thrombus. Normal compressibility, respiratory phasicity and response to augmentation. Popliteal Vein: No evidence of thrombus. Normal compressibility, respiratory phasicity and response to augmentation. Calf Veins: No evidence of thrombus. Normal compressibility and flow on color Doppler imaging. Superficial Great Saphenous Vein: No evidence of thrombus. Normal compressibility. Venous Reflux:  None. Other Findings:  None. LEFT LOWER EXTREMITY Common Femoral  Vein: No evidence of thrombus. Normal compressibility, respiratory phasicity and response to augmentation. Saphenofemoral Junction: No evidence of thrombus. Normal compressibility and flow on color Doppler imaging. Profunda Femoral Vein: No evidence of thrombus. Normal compressibility and flow on color Doppler imaging. Femoral Vein: No evidence of thrombus. Normal compressibility, respiratory phasicity and response to augmentation. Popliteal Vein: No evidence of thrombus. Normal compressibility, respiratory phasicity and response to augmentation. Calf Veins: No evidence of thrombus. Normal compressibility and flow on color Doppler imaging. Superficial Great Saphenous Vein: No evidence of thrombus. Normal compressibility. Venous Reflux:  None. Other Findings: There is an approximately 3.0 x 1.5 x 1.4  cm fluid collection with left popliteal fossa compatible with a Baker's cyst. IMPRESSION: 1. No evidence of DVT within either lower extremity. 2. Note made of an approximately 3.0 cm left-sided Baker's cyst. Electronically Signed   By: Simonne Come M.D.   On: 12/09/2020 11:25   DG Chest Port 1 View  Result Date: 12/09/2020 CLINICAL DATA:  75 year old female with possible sepsis. EXAM: PORTABLE CHEST 1 VIEW COMPARISON:  Chest radiographs 07/06/2012 and earlier. FINDINGS: Portable AP semi upright view at 0439 hours. Lower lung volumes. Possible gastric hiatal hernia which was not apparent in 2014. Otherwise stable cardiomegaly and mediastinal contours. Visualized tracheal air column is within normal limits. No pneumothorax or pulmonary edema. No consolidation or air bronchograms identified, but new blunting of the left lateral lung base is suggestive of pleural effusion. Paucity of bowel gas in the upper abdomen. Chronic degenerative and postoperative changes about the right shoulder. No acute osseous abnormality identified. IMPRESSION: 1. Evidence of a small left pleural effusion, but no other acute cardiopulmonary  abnormality identified. 2. Suggestion of hiatal hernia, new since 2014. 3. Chronic cardiomegaly. Electronically Signed   By: Odessa Fleming M.D.   On: 12/09/2020 05:35        Scheduled Meds:  atorvastatin  80 mg Oral Daily   buPROPion  150 mg Oral Daily   clopidogrel  75 mg Oral Daily   hydrOXYzine  50 mg Oral Daily   insulin aspart  0-5 Units Subcutaneous QHS   insulin aspart  0-9 Units Subcutaneous TID WC   insulin aspart protamine- aspart  8 Units Subcutaneous BID WC   isosorbide mononitrate  30 mg Oral Daily   pantoprazole  40 mg Oral Daily   traMADol  50 mg Oral Daily   traZODone  50 mg Oral QHS   Continuous Infusions:   ceFAZolin (ANCEF) IV Stopped (12/10/20 1045)   heparin 1,000 Units/hr (12/09/20 1700)   sodium chloride       LOS: 0 days    Time spent: 35 minutes    Tresa Moore, MD [facility] Pager [phone]  If 7PM-7AM, please contact night-coverage 12/10/2020, 2:20 PM

## 2020-12-10 NOTE — ED Notes (Addendum)
Pt resting in bed.

## 2020-12-10 NOTE — ED Notes (Signed)
Lab called to collect cultures, they stated they will be at bedside to draw them whenever they can.

## 2020-12-10 NOTE — Progress Notes (Signed)
PT Cancellation Note  Patient Details Name: Jamie Benitez MRN: [id number] DOB: 1945/03/26   Cancelled Treatment:    Reason Eval/Treat Not Completed: Other (comment). Per RN pt with HR at rest in the 120s, and pt reported fatigue. PT to re-attempt as able pending medical appropriateness.  Olga Coaster PT, DPT 1:37 PM,12/10/20

## 2020-12-10 NOTE — Progress Notes (Signed)
ANTICOAGULATION CONSULT NOTE  Pharmacy Consult for heparin infusion Indication: nSTEMI/ACS  Allergies  Allergen Reactions   Sulfa Antibiotics Rash    Mouth blisters Other reaction(s): Angioedema "whole mouth swells"   Gabapentin Diarrhea    Patient Measurements: Height: 5' (152.4 cm) Weight: 102.1 kg (225 lb) IBW/kg (Calculated) : 45.5 Heparin Dosing Weight: 70.4 kg  Vital Signs: BP: 99/51 (09/22 0230) Pulse Rate: 86 (09/22 0230)  Labs: Recent Labs    12/09/20 0415 12/09/20 0616 12/09/20 0920 12/09/20 1259 12/09/20 1519 12/09/20 1558 12/10/20 0225  HGB 12.3  --   --   --   --   --   --   HCT 37.6  --   --   --   --   --   --   PLT 190  --   --   --   --   --   --   APTT  --  28  --   --   --   --   --   LABPROT  --  14.1  --   --   --   --   --   INR  --  1.1  --   --   --   --   --   HEPARINUNFRC  --   --   --   --   --  0.28* 0.33  CREATININE 2.02*  --   --   --   --   --   --   TROPONINIHS 105* 93* 93* 113* 133*  --   --      Estimated Creatinine Clearance: 26.7 mL/min (A) (by C-G formula based on SCr of 2.02 mg/dL (H)).   Medical History: Past Medical History:  Diagnosis Date   Anxiety    Bell's palsy 08/2018   CHF (congestive heart failure) (HCC)    Chronic back pain    Chronic cough    CKD (chronic kidney disease), stage III (HCC)    Depression    Diabetes mellitus, type 2 (HCC)    GERD (gastroesophageal reflux disease)    Hyperlipidemia    Hypertension    Multilevel degenerative disc disease    Myocardial infarction [facility]) 2007   & 2014   Neuropathy    Osteoporosis     Assessment: Pt is 75 yo female presenting with generalized weakness, found w/ possible nSTEMI/ACS.  Goal of Therapy:  Heparin level 0.3-0.7 units/ml Monitor platelets by anticoagulation protocol: Yes   9/21 1558  HL 0.28 , subtherapeutic 9/22 0225 HL 0.33, therapeutic x 1  Plan:  Continue heparin infusion at 1000 unit/hr Will recheck HL in 8 hrs to confirm  CBC  daily while on heparin  Otelia Sergeant, PharmD, [facility] 12/10/2020 3:34 AM

## 2020-12-10 NOTE — Progress Notes (Signed)
ANTICOAGULATION CONSULT NOTE  Pharmacy Consult for heparin infusion Indication: nSTEMI/ACS  Allergies  Allergen Reactions   Sulfa Antibiotics Rash    Mouth blisters Other reaction(s): Angioedema "whole mouth swells"   Gabapentin Diarrhea    Patient Measurements: Height: 5' (152.4 cm) Weight: 102.1 kg (225 lb) IBW/kg (Calculated) : 45.5 Heparin Dosing Weight: 70.4 kg  Vital Signs: BP: 104/48 (09/22 1300) Pulse Rate: 124 (09/22 1305)  Labs: Recent Labs    12/09/20 0415 12/09/20 0616 12/09/20 0920 12/09/20 1259 12/09/20 1519 12/09/20 1558 12/10/20 0225 12/10/20 0721 12/10/20 1305  HGB 12.3  --   --   --   --   --   --  10.7*  --   HCT 37.6  --   --   --   --   --   --  33.0*  --   PLT 190  --   --   --   --   --   --  150  --   APTT  --  28  --   --   --   --   --   --   --   LABPROT  --  14.1  --   --   --   --   --   --   --   INR  --  1.1  --   --   --   --   --   --   --   HEPARINUNFRC  --   --   --   --   --  0.28* 0.33  --  0.33  CREATININE 2.02*  --   --   --   --   --   --  4.21*  --   TROPONINIHS 105* 93*   < > 113* 133*  --   --  156*  --    < > = values in this interval not displayed.     Estimated Creatinine Clearance: 12.8 mL/min (A) (by C-G formula based on SCr of 4.21 mg/dL (H)).   Medical History: Past Medical History:  Diagnosis Date   Anxiety    Bell's palsy 08/2018   CHF (congestive heart failure) (HCC)    Chronic back pain    Chronic cough    CKD (chronic kidney disease), stage III (HCC)    Depression    Diabetes mellitus, type 2 (HCC)    GERD (gastroesophageal reflux disease)    Hyperlipidemia    Hypertension    Multilevel degenerative disc disease    Myocardial infarction [facility]) 2007   & 2014   Neuropathy    Osteoporosis     Assessment: Pt is 75 yo female presenting with generalized weakness, found w/ possible nSTEMI/ACS.  Goal of Therapy:  Heparin level 0.3-0.7 units/ml Monitor platelets by anticoagulation protocol:  Yes   9/21 1558  HL 0.28 , subtherapeutic 9/22 0225 HL 0.33, therapeutic x 1 9/22 1305 HL 0.33, therapeutic x2  Plan:  Continue heparin infusion at 1000 unit/hr Will recheck HL with AM labs  Zakeria Kulzer Rodriguez-Guzman PharmD, BCPS 12/10/2020 1:30 PM

## 2020-12-10 NOTE — Care Management (Signed)
New onset atrial fibrillation noted on telemetry and EKG Associated with hypotension Patient also endorsing severe back pain  Plan: Patient previously responded to cardizem push however blood pressure now prevents Will start amio gtt.  150 bolus and drip Continue heparin gtt 500cc NS bolus for volume support Avoid morphine with severe renal failure, start low dose dilaudid Continue telemetry and PCU status  Lolita Patella MD

## 2020-12-10 NOTE — ED Notes (Signed)
Lab called at this time to add troponin onto morning labs drawn.

## 2020-12-10 NOTE — Progress Notes (Signed)
[facility] Cardiology    SUBJECTIVE:  Patient complains of weakness fatigue denies any pain there is some shortness of breath  Vitals:   12/10/20 0700 12/10/20 0730 12/10/20 0800 12/10/20 0830  BP: (!) 120/50 (!) 112/54 129/70 114/70  Pulse: 87 77 74 83  Resp: (!) 24 18 (!) 23 (!) 21  Temp:      TempSrc:      SpO2: 97% 97% 97% 97%  Weight:      Height:         Intake/Output Summary (Last 24 hours) at 12/10/2020 1055 Last data filed at 12/09/2020 2152 Gross per 24 hour  Intake 100 ml  Output 0 ml  Net 100 ml      PHYSICAL EXAM  General: Well developed, well nourished, in no acute distress HEENT:  Normocephalic and atramatic Neck:  No JVD.  Lungs: Clear bilaterally to auscultation and percussion. Heart: HRRR . Normal S1 and S2 without gallops or murmurs.  Abdomen: Bowel sounds are positive, abdomen soft and non-tender  Msk:  Back normal, normal gait. Normal strength and tone for age. Extremities: No clubbing, cyanosis or edema.   Neuro: Alert and oriented X 3. Psych:  Good affect, responds appropriately   LABS: Basic Metabolic Panel: Recent Labs    12/09/20 0415 12/10/20 0721  NA 134* 132*  K 3.4* 3.8  CL 100 98  CO2 22 18*  GLUCOSE 238* 113*  BUN 35* 57*  CREATININE 2.02* 4.21*  CALCIUM 8.5* 8.0*  MG 1.8  --    Liver Function Tests: Recent Labs    12/09/20 0415  AST 37  ALT 24  ALKPHOS 85  BILITOT 1.2  PROT 6.9  ALBUMIN 3.5   Recent Labs    12/09/20 0415  LIPASE 35   CBC: Recent Labs    12/09/20 0415 12/10/20 0721  WBC 11.4* 13.9*  NEUTROABS 9.1*  --   HGB 12.3 10.7*  HCT 37.6 33.0*  MCV 86.0 84.2  PLT 190 150   Cardiac Enzymes: No results for input(s): CKTOTAL, CKMB, CKMBINDEX, TROPONINI in the last 72 hours. BNP: Invalid input(s): POCBNP D-Dimer: Recent Labs    12/09/20 0418  DDIMER 3.09*   Hemoglobin A1C: Recent Labs    12/09/20 0920  HGBA1C 5.8*   Fasting Lipid Panel: Recent Labs    12/10/20 0721  CHOL 94  HDL 33*   LDLCALC 37  TRIG 120  CHOLHDL 2.8   Thyroid Function Tests: No results for input(s): TSH, T4TOTAL, T3FREE, THYROIDAB in the last 72 hours.  Invalid input(s): FREET3 Anemia Panel: No results for input(s): VITAMINB12, FOLATE, FERRITIN, TIBC, IRON, RETICCTPCT in the last 72 hours.  NM Pulmonary Perfusion  Result Date: 12/09/2020 CLINICAL DATA:  Pulmonary embolism suspected. Low intermediate probability. Elevated D-dimer levels. Negative lower extremity Doppler ultrasound for DVT. EXAM: NUCLEAR MEDICINE PERFUSION LUNG SCAN TECHNIQUE: Perfusion images were obtained in multiple projections after intravenous injection of radiopharmaceutical. Ventilation scans intentionally deferred if perfusion scan and chest x-ray adequate for interpretation during COVID 19 epidemic. RADIOPHARMACEUTICALS:  4.52 mCi Tc-25m MAA IV COMPARISON:  Portable chest 12/09/2020. Lower extremity Doppler ultrasound 12/09/2020. FINDINGS: There are no segmental perfusion defects to suggest pulmonary embolism. The heart is enlarged. There is mildly decreased perfusion to the left lung base, corresponding with a probable small left pleural effusion or pleural thickening on radiography. IMPRESSION: No evidence of pulmonary embolism. Electronically Signed   By: Carey Bullocks M.D.   On: 12/09/2020 12:48   US RENAL  Result Date: 12/09/2020  CLINICAL DATA:  Acute kidney injury EXAM: RENAL / URINARY TRACT ULTRASOUND COMPLETE COMPARISON:  None. FINDINGS: Right Kidney: Renal measurements: 10.4 x 5.0 x 5.4 cm = volume: 147.18 mL. Echogenicity within normal limits. No mass or hydronephrosis visualized. Left Kidney: Renal measurements: 11.5 x 5.3 x 3.9 cm = volume: 123.9 mL. Upper pole cyst measures 1.8 x 1.2 x 1.3 cm. Inferior pole cyst measures 1.5 x 1.1 x 1.3 cm Echogenicity within normal limits. No mass or hydronephrosis. Bladder: Appears normal for degree of bladder distention. Other: None. IMPRESSION: 1. Echogenicity within normal limits.  No mass or hydronephrosis visualized. 2. Left kidney cysts. Electronically Signed   By: Signa Kell M.D.   On: 12/09/2020 09:31   US Venous Img Lower Bilateral (DVT)  Result Date: 12/09/2020 CLINICAL DATA:  Elevated D-dimer. Shortness of breath. Evaluate for DVT. EXAM: BILATERAL LOWER EXTREMITY VENOUS DOPPLER ULTRASOUND TECHNIQUE: Gray-scale sonography with graded compression, as well as color Doppler and duplex ultrasound were performed to evaluate the lower extremity deep venous systems from the level of the common femoral vein and including the common femoral, femoral, profunda femoral, popliteal and calf veins including the posterior tibial, peroneal and gastrocnemius veins when visible. The superficial great saphenous vein was also interrogated. Spectral Doppler was utilized to evaluate flow at rest and with distal augmentation maneuvers in the common femoral, femoral and popliteal veins. COMPARISON:  None. FINDINGS: RIGHT LOWER EXTREMITY Common Femoral Vein: No evidence of thrombus. Normal compressibility, respiratory phasicity and response to augmentation. Saphenofemoral Junction: No evidence of thrombus. Normal compressibility and flow on color Doppler imaging. Profunda Femoral Vein: No evidence of thrombus. Normal compressibility and flow on color Doppler imaging. Femoral Vein: No evidence of thrombus. Normal compressibility, respiratory phasicity and response to augmentation. Popliteal Vein: No evidence of thrombus. Normal compressibility, respiratory phasicity and response to augmentation. Calf Veins: No evidence of thrombus. Normal compressibility and flow on color Doppler imaging. Superficial Great Saphenous Vein: No evidence of thrombus. Normal compressibility. Venous Reflux:  None. Other Findings:  None. LEFT LOWER EXTREMITY Common Femoral Vein: No evidence of thrombus. Normal compressibility, respiratory phasicity and response to augmentation. Saphenofemoral Junction: No evidence of thrombus.  Normal compressibility and flow on color Doppler imaging. Profunda Femoral Vein: No evidence of thrombus. Normal compressibility and flow on color Doppler imaging. Femoral Vein: No evidence of thrombus. Normal compressibility, respiratory phasicity and response to augmentation. Popliteal Vein: No evidence of thrombus. Normal compressibility, respiratory phasicity and response to augmentation. Calf Veins: No evidence of thrombus. Normal compressibility and flow on color Doppler imaging. Superficial Great Saphenous Vein: No evidence of thrombus. Normal compressibility. Venous Reflux:  None. Other Findings: There is an approximately 3.0 x 1.5 x 1.4 cm fluid collection with left popliteal fossa compatible with a Baker's cyst. IMPRESSION: 1. No evidence of DVT within either lower extremity. 2. Note made of an approximately 3.0 cm left-sided Baker's cyst. Electronically Signed   By: Simonne Come M.D.   On: 12/09/2020 11:25   DG Chest Port 1 View  Result Date: 12/09/2020 CLINICAL DATA:  75 year old female with possible sepsis. EXAM: PORTABLE CHEST 1 VIEW COMPARISON:  Chest radiographs 07/06/2012 and earlier. FINDINGS: Portable AP semi upright view at 0439 hours. Lower lung volumes. Possible gastric hiatal hernia which was not apparent in 2014. Otherwise stable cardiomegaly and mediastinal contours. Visualized tracheal air column is within normal limits. No pneumothorax or pulmonary edema. No consolidation or air bronchograms identified, but new blunting of the left lateral lung base is suggestive of pleural  effusion. Paucity of bowel gas in the upper abdomen. Chronic degenerative and postoperative changes about the right shoulder. No acute osseous abnormality identified. IMPRESSION: 1. Evidence of a small left pleural effusion, but no other acute cardiopulmonary abnormality identified. 2. Suggestion of hiatal hernia, new since 2014. 3. Chronic cardiomegaly. Electronically Signed   By: Odessa Fleming M.D.   On: 12/09/2020 05:35        TELEMETRY: Normal sinus rhythm rate of around 70 nonspecific ST-T wave changes:  ASSESSMENT AND PLAN:  Principal Problem:   NSTEMI (non-ST elevated myocardial infarction) (HCC) Active Problems:   Chronic systolic CHF (congestive heart failure) (HCC)   Chronic back pain   Acute renal failure superimposed on stage 3a chronic kidney disease (HCC)   Depression with anxiety   Type II diabetes mellitus with renal manifestations (HCC)   CAD (coronary artery disease)   HLD (hyperlipidemia)   HTN (hypertension)   Hypokalemia   Leukocytosis    Plan This does not represent a non-STEMI in my opinion Would recommend conservative therapy for chronic systolic congestive heart failure diuresis Significant renal insufficiency consider nephrology input for acute on chronic renal insufficiency Continue diabetes management and control Continue statin therapy for hyperlipidemia I do not recommend invasive procedure at this point We will treat the patient aggressively medically for now Have a follow-up as an outpatient with cardiology   Alwyn Pea, MD 12/10/2020 10:55 AM

## 2020-12-10 NOTE — Consult Note (Addendum)
NAME: Jamie Benitez  DOB: Dec 26, 1945  MRN: [id number]  Date/Time: 12/10/2020 7:09 PM  REQUESTING PROVIDER: Dr. Georgeann Oppenheim Subjective:  REASON FOR CONSULT: MSSA bacteremia ?History not available from patient- chart reviewed and spoke to her grandson at bedside Jamie Benitez is a 75 y.o. with a history of CAD s/p stents last one 09/30/14 to mid lad, CHF, ischemic cardiomyopathy, HTN, HLD, RBBB, psoriasis presented to the ED by EMS on 12/09/20 with generalized weakness since Sunday. As per grandson she has been weak and progressively getting worse and hence came to the ED She also has severe low back pain for the past few days on top of her baseline back pain.  No trauma. Had some diarrhea but that has resolved. Vitals 102/66, Temp 97.9, Pulse 93, RR 30 and stas 95% Blood culture sent. EKG RBB- CXR cardiomegaly- VQ scan no PE.  Labs showed wbc 11.4, HB 12.3, PLT 190, cr 2.02 and increased to 4 Troponin high I am seeing pt as blood culture positive for MSSA- and patient is on cefazolin  Past Medical History:  Diagnosis Date   Anxiety    Bell's palsy 08/2018   CHF (congestive heart failure) (HCC)    Chronic back pain    Chronic cough    CKD (chronic kidney disease), stage III (HCC)    Depression    Diabetes mellitus, type 2 (HCC)    GERD (gastroesophageal reflux disease)    Hyperlipidemia    Hypertension    Multilevel degenerative disc disease    Myocardial infarction (HCC) 2007   & 2014   Neuropathy    Osteoporosis    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  stents   CATARACT EXTRACTION W/PHACO Right 09/16/2019   Procedure: CATARACT EXTRACTION PHACO AND INTRAOCULAR LENS PLACEMENT (IOC) RIGHT DIABETIC 2.12  00:27.3;  Surgeon: Nevada Crane, MD;  Location: [facility];  Service: Ophthalmology;  Laterality: Right;  Diabetic - insulin and oral meds   ROTATOR CUFF REPAIR     x2   Social History   Socioeconomic History   Marital status: Divorced    Spouse name: Not on file   Number of children: Not on file   Years of education: Not on file   Highest education level: Not on file  Occupational History   Not on file  Tobacco Use   Smoking status: Former    Types: Cigarettes    Quit date: 2014    Years since quitting: 8.7   Smokeless tobacco: Never  Vaping Use   Vaping Use: Never used  Substance and Sexual Activity   Alcohol use: Not Currently   Drug use: Never   Sexual activity: Not on file  Other Topics Concern   Not on file  Social History Narrative   Not on file   Social Determinants of Health   Financial Resource Strain: Not on file  Food Insecurity: Not on file  Transportation Needs: Not on file  Physical Activity: Not on file  Stress: Not on file  Social Connections: Not on file  Intimate Partner Violence: Not on file    Family History  Problem Relation Age of Onset   Diabetes type II Daughter    Diabetes type II Son    Allergies  Allergen Reactions   Sulfa Antibiotics Rash    Mouth blisters Other reaction(s): Angioedema "whole mouth swells"   Gabapentin Diarrhea   I? Current Facility-Administered Medications  Medication Dose Route Frequency Provider Last Rate Last Admin   acetaminophen (TYLENOL) tablet 650 mg  650 mg Oral Q6H PRN Lorretta Harp, MD       albuterol (PROVENTIL) (2.5 MG/3ML) 0.083% nebulizer solution 2.5 mg  2.5 mg Nebulization Q4H PRN Lorretta Harp, MD       amiodarone (NEXTERONE PREMIX) 360-4.14 MG/200ML-% (1.8 mg/mL) IV infusion  60 mg/hr Intravenous Continuous Lolita Patella B, MD 33.3 mL/hr at 12/10/20 1905 60 mg/hr at 12/10/20 1905   Followed by   Melene Muller ON 12/11/2020] amiodarone (NEXTERONE PREMIX) 360-4.14 MG/200ML-% (1.8 mg/mL) IV infusion  30 mg/hr Intravenous Continuous Sreenath, Sudheer  B, MD       atorvastatin (LIPITOR) tablet 80 mg  80 mg Oral Daily Lorretta Harp, MD   80 mg at 12/10/20 1018   buPROPion (WELLBUTRIN XL) 24 hr tablet 150 mg  150 mg Oral Daily Lorretta Harp, MD   150 mg at 12/10/20 1017   ceFAZolin (ANCEF) IVPB 2g/100 mL premix  2 g Intravenous Q12H Lorretta Harp, MD   Stopped at 12/10/20 1045   clopidogrel (PLAVIX) tablet 75 mg  75 mg Oral Daily Lorretta Harp, MD   75 mg at 12/10/20 1018   dextromethorphan-guaiFENesin (MUCINEX DM) 30-600 MG per 12 hr tablet 1 tablet  1 tablet Oral BID PRN Lorretta Harp, MD       heparin ADULT infusion 100 units/mL (25000 units/256mL)  1,000 Units/hr Intravenous Continuous Lolita Patella B, MD 10 mL/hr at 12/10/20 1513 1,000 Units/hr at 12/10/20 1513   hydrALAZINE (APRESOLINE) injection 5 mg  5 mg Intravenous Q2H PRN Lorretta Harp, MD       HYDROmorphone (DILAUDID) injection 0.5 mg  0.5 mg Intravenous Q3H PRN Lolita Patella B, MD       hydrOXYzine (ATARAX/VISTARIL) tablet 50 mg  50 mg Oral Daily Lorretta Harp, MD   50 mg at 12/10/20 1017   insulin aspart (novoLOG) injection 0-5 Units  0-5 Units Subcutaneous QHS Lorretta Harp, MD       insulin aspart (novoLOG) injection 0-9 Units  0-9 Units Subcutaneous TID WC Lorretta Harp, MD   2 Units at 12/09/20 0840   insulin aspart protamine- aspart (NOVOLOG MIX 70/30) injection 8 Units  8 Units Subcutaneous BID WC Lorretta Harp, MD   8 Units at 12/10/20 1019   isosorbide mononitrate (IMDUR) 24 hr tablet 30 mg  30 mg Oral Daily Lorretta Harp, MD   30 mg at 12/10/20 1018   lidocaine (LIDODERM) 5 % 1 patch  1 patch Transdermal Q24H Sreenath, Sudheer B, MD       methocarbamol (ROBAXIN) tablet 500 mg  500 mg Oral Q6H PRN Lolita Patella B, MD   500 mg at 12/10/20 1302   metoprolol tartrate (LOPRESSOR) injection 5 mg  5 mg Intravenous Q3H PRN Lolita Patella B, MD   5 mg at 12/10/20 1429   mupirocin ointment (BACTROBAN) 2 % 1 application  1 application Nasal BID PRN Lorretta Harp, MD       pantoprazole (PROTONIX) EC tablet  40 mg  40 mg Oral Daily Lorretta Harp, MD   40 mg at 12/10/20 1018   traMADol (ULTRAM) tablet 50 mg  50 mg Oral Daily Lorretta Harp, MD   50 mg at 12/10/20 1031   traZODone (DESYREL) tablet 50 mg  50 mg Oral QHS Niu,  Brien Few, MD   50 mg at 12/09/20 2235     Abtx:  Anti-infectives (From admission, onward)    Start     Dose/Rate Route Frequency Ordered Stop   12/09/20 2000  ceFAZolin (ANCEF) IVPB 2g/100 mL premix        2 g 200 mL/hr over 30 Minutes Intravenous Every 12 hours 12/09/20 1803         REVIEW OF SYSTEMS:  Const: negative fever, negative chills, negative weight loss Eyes: negative diplopia or visual changes, negative eye pain ENT: negative coryza, negative sore throat Resp: negative cough, hemoptysis, has dyspnea Cards: negative for chest pain, palpitations, lower extremity edema GU: negative for frequency, dysuria and hematuria GI: Negative for abdominal pain, had diarrhea, bleeding, constipation Skin: psoriasis Heme: negative for easy bruising and gum/nose bleeding MS: generalized weakness Neurolo:negative for headaches, dizziness, vertigo, memory problems  Psych: negative for feelings of anxiety, depression  Endocrine: negative for thyroid, diabetes Allergy/Immunology- as above  Objective:  VITALS:  BP 100/63   Pulse (!) 114   Temp 98.6 F (37 C) (Oral)   Resp (!) 22   Ht 5' (1.524 m)   Wt 102.1 kg   SpO2 100%   BMI 43.94 kg/m  PHYSICAL EXAM:  General: awake, tachypnec, some distress, hard of hearing, pale Head: Normocephalic, without obvious abnormality, atraumatic. Eyes: Conjunctivae clear, anicteric sclerae. Pupils are equal ENT Nares normal. No drainage or sinus tenderness. Lips, mucosa, and tongue normal. No Thrush Neck: Supple, symmetrical, no adenopathy, thyroid: non tender no carotid bruit and no JVD. Back: No CVA tenderness. Lungs: b/l air entry- few sibilant rhonchi Heart: irregular . Abdomen: Soft, non-tender,not distended. Bowel sounds normal. No  masses Extremities: atraumatic, no cyanosis. No edema. No clubbing Skin: scaly patches  Lymph: Cervical, supraclavicular normal. Neurologic: Grossly non-focal Pertinent Labs Lab Results CBC    Component Value Date/Time   WBC 13.9 (H) 12/10/2020 0721   RBC 3.92 12/10/2020 0721   HGB 10.7 (L) 12/10/2020 0721   HGB 10.8 (L) 07/06/2012 1924   HCT 33.0 (L) 12/10/2020 0721   HCT 33.5 (L) 07/06/2012 1924   PLT 150 12/10/2020 0721   PLT 592 (H) 07/06/2012 1924   MCV 84.2 12/10/2020 0721   MCV 82 07/06/2012 1924   MCH 27.3 12/10/2020 0721   MCHC 32.4 12/10/2020 0721   RDW 15.5 12/10/2020 0721   RDW 17.8 (H) 07/06/2012 1924   LYMPHSABS 1.0 12/09/2020 0415   MONOABS 1.3 (H) 12/09/2020 0415   EOSABS 0.0 12/09/2020 0415   BASOSABS 0.0 12/09/2020 0415    CMP Latest Ref Rng & Units 12/10/2020 12/09/2020 07/08/2012  Glucose 70 - 99 mg/dL 758(I) 325(Q) 982(M)  BUN 8 - 23 mg/dL 41(R) 83(E) 12  Creatinine 0.44 - 1.00 mg/dL 9.40(H) 6.80(S) 8.11  Sodium 135 - 145 mmol/L 132(L) 134(L) 140  Potassium 3.5 - 5.1 mmol/L 3.8 3.4(L) 3.7  Chloride 98 - 111 mmol/L 98 100 106  CO2 22 - 32 mmol/L 18(L) 22 27  Calcium 8.9 - 10.3 mg/dL 8.0(L) 8.5(L) 8.6  Total Protein 6.5 - 8.1 g/dL - 6.9 -  Total Bilirubin 0.3 - 1.2 mg/dL - 1.2 -  Alkaline Phos 38 - 126 U/L - 85 -  AST 15 - 41 U/L - 37 -  ALT 0 - 44 U/L - 24 -      Microbiology: Recent Results (from the past 240 hour(s))  Resp Panel by RT-PCR (Flu A&B, Covid) Nasopharyngeal Swab     Status: None  Collection Time: 12/09/20  4:34 AM   Specimen: Nasopharyngeal Swab; Nasopharyngeal(NP) swabs in vial transport medium  Result Value Ref Range Status   SARS Coronavirus 2 by RT PCR NEGATIVE NEGATIVE Final    Comment: (NOTE) SARS-CoV-2 target nucleic acids are NOT DETECTED.  The SARS-CoV-2 RNA is generally detectable in upper respiratory specimens during the acute phase of infection. The lowest concentration of SARS-CoV-2 viral copies this assay can  detect is 138 copies/mL. A negative result does not preclude SARS-Cov-2 infection and should not be used as the sole basis for treatment or other patient management decisions. A negative result may occur with  improper specimen collection/handling, submission of specimen other than nasopharyngeal swab, presence of viral mutation(s) within the areas targeted by this assay, and inadequate number of viral copies(<138 copies/mL). A negative result must be combined with clinical observations, patient history, and epidemiological information. The expected result is Negative.  Fact Sheet for Patients:  BloggerCourse.com  Fact Sheet for Healthcare Providers:  SeriousBroker.it  This test is no t yet approved or cleared by the [place] FDA and  has been authorized for detection and/or diagnosis of SARS-CoV-2 by FDA under an Emergency Use Authorization (EUA). This EUA will remain  in effect (meaning this test can be used) for the duration of the COVID-19 declaration under Section 564(b)(1) of the Act, 21 U.S.C.section 360bbb-3(b)(1), unless the authorization is terminated  or revoked sooner.       Influenza A by PCR NEGATIVE NEGATIVE Final   Influenza B by PCR NEGATIVE NEGATIVE Final    Comment: (NOTE) The Xpert Xpress SARS-CoV-2/FLU/RSV plus assay is intended as an aid in the diagnosis of influenza from Nasopharyngeal swab specimens and should not be used as a sole basis for treatment. Nasal washings and aspirates are unacceptable for Xpert Xpress SARS-CoV-2/FLU/RSV testing.  Fact Sheet for Patients: BloggerCourse.com  Fact Sheet for Healthcare Providers: SeriousBroker.it  This test is not yet approved or cleared by the [place] FDA and has been authorized for detection and/or diagnosis of SARS-CoV-2 by FDA under an Emergency Use Authorization (EUA). This EUA will remain in  effect (meaning this test can be used) for the duration of the COVID-19 declaration under Section 564(b)(1) of the Act, 21 U.S.C. section 360bbb-3(b)(1), unless the authorization is terminated or revoked.  Performed at [facility], [address] [place], [place] [address]   Blood culture (routine single)     Status: Abnormal (Preliminary result)   Collection Time: 12/09/20  4:34 AM   Specimen: BLOOD  Result Value Ref Range Status   Specimen Description   Final    BLOOD LEFT ARM Performed at [facility], [address] [place], [place] [address]    Special Requests   Final    BOTTLES DRAWN AEROBIC AND ANAEROBIC Blood Culture adequate volume Performed at [facility], [address] [place], [place] [address]    Culture  Setup Time   Final    GRAM POSITIVE COCCI IN BOTH AEROBIC AND ANAEROBIC BOTTLES CRITICAL RESULT CALLED TO, READ BACK BY AND VERIFIED WITH: SAMANTHA RAEUR AT 1750 ON 12/09/20 BY SS    Culture (A)  Final    STAPHYLOCOCCUS AUREUS SUSCEPTIBILITIES TO FOLLOW Performed at [facility], [address] [place], [place] [address]    Report Status PENDING  Incomplete  Blood Culture ID Panel (Reflexed)     Status: Abnormal   Collection Time: 12/09/20  4:34 AM  Result Value Ref Range Status  Enterococcus faecalis NOT DETECTED NOT DETECTED Final   Enterococcus Faecium NOT DETECTED NOT DETECTED Final   Listeria monocytogenes NOT DETECTED NOT DETECTED Final   Staphylococcus species DETECTED (A) NOT DETECTED Final    Comment: CRITICAL RESULT CALLED TO, READ BACK BY AND VERIFIED WITH: SAMANTHA RAEUR AT 1750 ON 12/09/20 BY SS    Staphylococcus aureus (BCID) DETECTED (A) NOT DETECTED Final    Comment: CRITICAL RESULT CALLED TO, READ BACK BY AND VERIFIED WITH: SAMANTHA RAEUR AT 1750 ON 12/09/20 BY SS    Staphylococcus epidermidis NOT DETECTED NOT DETECTED Final   Staphylococcus lugdunensis NOT DETECTED NOT DETECTED Final    Streptococcus species NOT DETECTED NOT DETECTED Final   Streptococcus agalactiae NOT DETECTED NOT DETECTED Final   Streptococcus pneumoniae NOT DETECTED NOT DETECTED Final   Streptococcus pyogenes NOT DETECTED NOT DETECTED Final   A.calcoaceticus-baumannii NOT DETECTED NOT DETECTED Final   Bacteroides fragilis NOT DETECTED NOT DETECTED Final   Enterobacterales NOT DETECTED NOT DETECTED Final   Enterobacter cloacae complex NOT DETECTED NOT DETECTED Final   Escherichia coli NOT DETECTED NOT DETECTED Final   Klebsiella aerogenes NOT DETECTED NOT DETECTED Final   Klebsiella oxytoca NOT DETECTED NOT DETECTED Final   Klebsiella pneumoniae NOT DETECTED NOT DETECTED Final   Proteus species NOT DETECTED NOT DETECTED Final   Salmonella species NOT DETECTED NOT DETECTED Final   Serratia marcescens NOT DETECTED NOT DETECTED Final   Haemophilus influenzae NOT DETECTED NOT DETECTED Final   Neisseria meningitidis NOT DETECTED NOT DETECTED Final   Pseudomonas aeruginosa NOT DETECTED NOT DETECTED Final   Stenotrophomonas maltophilia NOT DETECTED NOT DETECTED Final   Candida albicans NOT DETECTED NOT DETECTED Final   Candida auris NOT DETECTED NOT DETECTED Final   Candida glabrata NOT DETECTED NOT DETECTED Final   Candida krusei NOT DETECTED NOT DETECTED Final   Candida parapsilosis NOT DETECTED NOT DETECTED Final   Candida tropicalis NOT DETECTED NOT DETECTED Final   Cryptococcus neoformans/gattii NOT DETECTED NOT DETECTED Final   Meth resistant mecA/C and MREJ NOT DETECTED NOT DETECTED Final    Comment: Performed at [facility], [address] [place], [place] [address]    IMAGING RESULTS:  I have personally reviewed the films ?chronic cardiomegaly VQ scan- No PE [place] leg No DVT   EKG   Impression/Recommendation ? MSSA bacteremia likely source could be the psoriatic skin lesions On cefazolin Repeat blood culture 2 d echo Has severe low back pain- need MRI to look for discitis  /vertebral osteo of lumbar spine  AKI combination of sepsis, cardiorenal syndrome  Afib with RVR- on amio drip  Demand ischemia  CAD s/p stent  ? __CHF Ischemic cardiomyopathy _________________________________________________ Discussed with patient,and her grand son Note:  This document was prepared using Dragon voice recognition software and may include unintentional dictation errors.

## 2020-12-10 NOTE — Progress Notes (Signed)
Patient arrived on the floor.  During the initial assessment, the patient's BP was low, HR was around 127 bpm (in A.Fib), and patient was complaining of 10 out of 10 pain.  Mews of yellow.  Patient denied chest pain and dizziness.   Charge nurse and Rapid response nurse made aware Messaged MD.   Orders placed for 500 ml bolus, Amio gtt and dilaudid

## 2020-12-11 ENCOUNTER — Inpatient Hospital Stay: Payer: Medicare Other

## 2020-12-11 ENCOUNTER — Inpatient Hospital Stay (HOSPITAL_COMMUNITY)
Admit: 2020-12-11 | Discharge: 2020-12-11 | Disposition: A | Discharge status: Home / Self Care | Payer: Medicare Other | Attending: Infectious Diseases | Admitting: Infectious Diseases

## 2020-12-11 DIAGNOSIS — N179 Acute kidney failure, unspecified: Secondary | ICD-10-CM

## 2020-12-11 DIAGNOSIS — D72829 Elevated white blood cell count, unspecified: Secondary | ICD-10-CM

## 2020-12-11 DIAGNOSIS — B9561 Methicillin susceptible Staphylococcus aureus infection as the cause of diseases classified elsewhere: Secondary | ICD-10-CM | POA: Diagnosis not present

## 2020-12-11 DIAGNOSIS — R778 Other specified abnormalities of plasma proteins: Secondary | ICD-10-CM

## 2020-12-11 DIAGNOSIS — J9601 Acute respiratory failure with hypoxia: Secondary | ICD-10-CM

## 2020-12-11 DIAGNOSIS — I214 Non-ST elevation (NSTEMI) myocardial infarction: Secondary | ICD-10-CM | POA: Diagnosis not present

## 2020-12-11 DIAGNOSIS — I248 Other forms of acute ischemic heart disease: Secondary | ICD-10-CM | POA: Diagnosis not present

## 2020-12-11 DIAGNOSIS — Z7189 Other specified counseling: Secondary | ICD-10-CM | POA: Diagnosis not present

## 2020-12-11 DIAGNOSIS — G8929 Other chronic pain: Secondary | ICD-10-CM

## 2020-12-11 DIAGNOSIS — Z515 Encounter for palliative care: Secondary | ICD-10-CM

## 2020-12-11 DIAGNOSIS — I509 Heart failure, unspecified: Secondary | ICD-10-CM | POA: Diagnosis not present

## 2020-12-11 DIAGNOSIS — R531 Weakness: Secondary | ICD-10-CM | POA: Diagnosis not present

## 2020-12-11 DIAGNOSIS — I5022 Chronic systolic (congestive) heart failure: Secondary | ICD-10-CM

## 2020-12-11 DIAGNOSIS — R7881 Bacteremia: Secondary | ICD-10-CM

## 2020-12-11 DIAGNOSIS — M549 Dorsalgia, unspecified: Secondary | ICD-10-CM

## 2020-12-11 LAB — URINE CULTURE

## 2020-12-11 LAB — GLUCOSE, CAPILLARY
Glucose-Capillary: 121 mg/dL — ABNORMAL HIGH (ref 70–99)
Glucose-Capillary: 125 mg/dL — ABNORMAL HIGH (ref 70–99)
Glucose-Capillary: 96 mg/dL (ref 70–99)
Glucose-Capillary: 95 mg/dL (ref 70–99)
Glucose-Capillary: 106 mg/dL — ABNORMAL HIGH (ref 70–99)

## 2020-12-11 LAB — COMPREHENSIVE METABOLIC PANEL
ALT: 11 U/L (ref 0–44)
AST: 101 U/L — ABNORMAL HIGH (ref 15–41)
Albumin: 1.8 g/dL — ABNORMAL LOW (ref 3.5–5.0)
Alkaline Phosphatase: 93 U/L (ref 38–126)
Anion gap: 15 (ref 5–15)
BUN: 70 mg/dL — ABNORMAL HIGH (ref 8–23)
CO2: 20 mmol/L — ABNORMAL LOW (ref 22–32)
Calcium: 6.4 mg/dL — CL (ref 8.9–10.3)
Chloride: 93 mmol/L — ABNORMAL LOW (ref 98–111)
Creatinine, Ser: 5.28 mg/dL — ABNORMAL HIGH (ref 0.44–1.00)
GFR, Estimated: 8 mL/min — ABNORMAL LOW (ref >60)
Glucose, Bld: 97 mg/dL (ref 70–99)
Potassium: 3.5 mmol/L (ref 3.5–5.1)
Sodium: 128 mmol/L — ABNORMAL LOW (ref 135–145)
Total Bilirubin: 0.7 mg/dL (ref 0.3–1.2)
Total Protein: 4.2 g/dL — ABNORMAL LOW (ref 6.5–8.1)

## 2020-12-11 LAB — HEPATIC FUNCTION PANEL
ALT: 13 U/L (ref 0–44)
AST: 76 U/L — ABNORMAL HIGH (ref 15–41)
Albumin: 2.3 g/dL — ABNORMAL LOW (ref 3.5–5.0)
Alkaline Phosphatase: 82 U/L (ref 38–126)
Bilirubin, Direct: 0.2 mg/dL (ref 0.0–0.2)
Indirect Bilirubin: 0.5 mg/dL (ref 0.3–0.9)
Total Bilirubin: 0.7 mg/dL (ref 0.3–1.2)
Total Protein: 5.3 g/dL — ABNORMAL LOW (ref 6.5–8.1)

## 2020-12-11 LAB — ALBUMIN: Albumin: 2.4 g/dL — ABNORMAL LOW (ref 3.5–5.0)

## 2020-12-11 LAB — ECHOCARDIOGRAM COMPLETE
AR max vel: 2.82 cm2
AV Area VTI: 2.89 cm2
AV Area mean vel: 2.9 cm2
AV Mean grad: 2.5 mmHg
AV Peak grad: 4.5 mmHg
Ao pk vel: 1.06 m/s
Area-P 1/2: 4.8 cm2
Height: 60 in
S' Lateral: 4.6 cm
Weight: 3600 oz

## 2020-12-11 LAB — CULTURE, BLOOD (SINGLE): Special Requests: ADEQUATE

## 2020-12-11 LAB — HEPARIN LEVEL (UNFRACTIONATED)
Heparin Unfractionated: 0.19 IU/mL — ABNORMAL LOW (ref 0.30–0.70)
Heparin Unfractionated: 0.21 IU/mL — ABNORMAL LOW (ref 0.30–0.70)

## 2020-12-11 LAB — BASIC METABOLIC PANEL
Anion gap: 12 (ref 5–15)
BUN: 65 mg/dL — ABNORMAL HIGH (ref 8–23)
CO2: 18 mmol/L — ABNORMAL LOW (ref 22–32)
Calcium: 7.3 mg/dL — ABNORMAL LOW (ref 8.9–10.3)
Chloride: 100 mmol/L (ref 98–111)
Creatinine, Ser: 5.17 mg/dL — ABNORMAL HIGH (ref 0.44–1.00)
GFR, Estimated: 8 mL/min — ABNORMAL LOW (ref >60)
Glucose, Bld: 128 mg/dL — ABNORMAL HIGH (ref 70–99)
Potassium: 3.8 mmol/L (ref 3.5–5.1)
Sodium: 130 mmol/L — ABNORMAL LOW (ref 135–145)

## 2020-12-11 LAB — LACTIC ACID, PLASMA
Lactic Acid, Venous: 1.2 mmol/L (ref 0.5–1.9)
Lactic Acid, Venous: 1.3 mmol/L (ref 0.5–1.9)
Lactic Acid, Venous: 1.7 mmol/L (ref 0.5–1.9)

## 2020-12-11 LAB — BRAIN NATRIURETIC PEPTIDE: B Natriuretic Peptide: 437.2 pg/mL — ABNORMAL HIGH (ref 0.0–100.0)

## 2020-12-11 LAB — TSH: TSH: 2.222 u[IU]/mL (ref 0.350–4.500)

## 2020-12-11 LAB — CORTISOL-AM, BLOOD: Cortisol - AM: 44.4 ug/dL — ABNORMAL HIGH (ref 6.7–22.6)

## 2020-12-11 LAB — MRSA NEXT GEN BY PCR, NASAL: MRSA by PCR Next Gen: NOT DETECTED

## 2020-12-11 IMAGING — MR MR THORACIC SPINE W/O CM
6 series · 32 of 48 positions shown · non-contrast
Comparison: None.

CLINICAL DATA: Low back pain, infection suspected; Mid-back pain,
neuro deficit

EXAM:
MRI THORACIC AND LUMBAR SPINE WITHOUT CONTRAST
TECHNIQUE: Multiplanar and multiecho pulse sequences of the thoracic and lumbar
spine were obtained without intravenous contrast.

[Series 18: T1 · sagittal · 6.0mm · 1.41mm/px · 4 of 9 slices shown (1 of 2)]
[im 1/9]
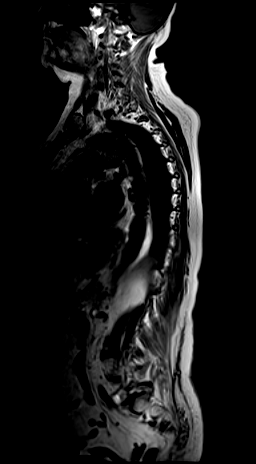
[im 3/9]
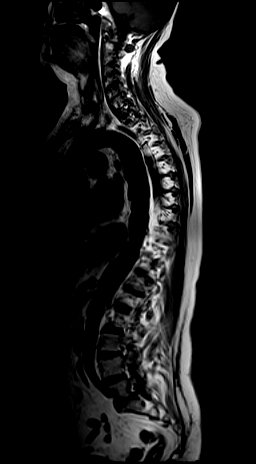
[im 6/9]
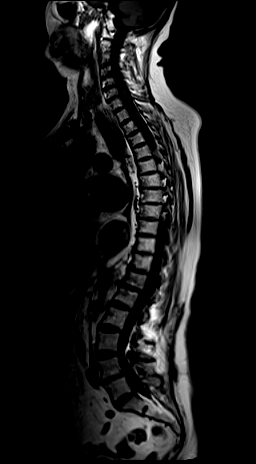
[im 9/9]
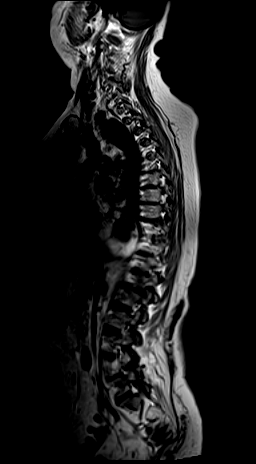

[Series 19: T2 · sagittal · 3.0mm · 1.33mm/px · 6 of 19 slices shown (1 of 2)]
[im 1/19]
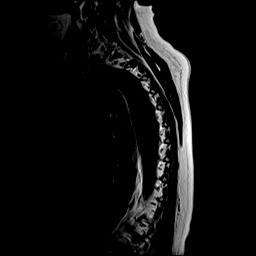
[im 4/19]
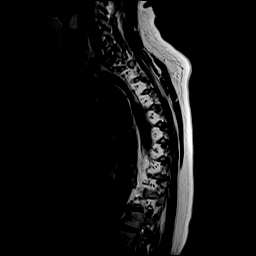
[im 8/19]
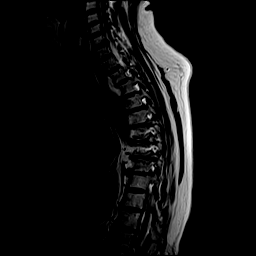
[im 11/19]
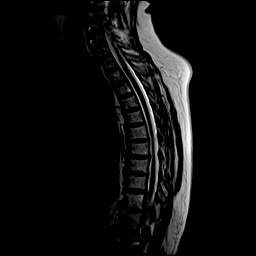
[im 15/19]
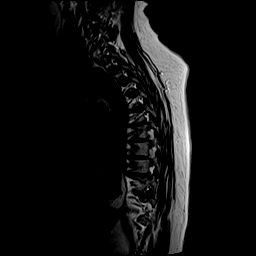
[im 19/19]
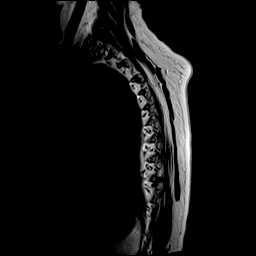

[Series 20: T1 · sagittal · 3.0mm · 1.33mm/px · 6 of 19 slices shown (2 of 2)]
[im 1/19]
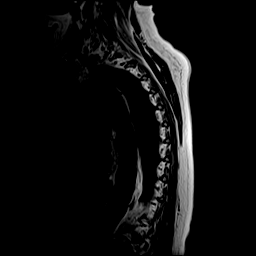
[im 4/19]
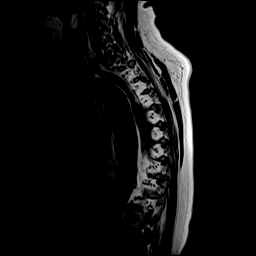
[im 8/19]
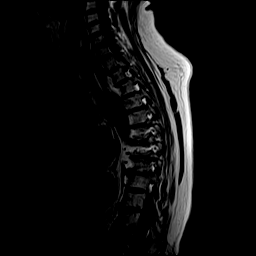
[im 11/19]
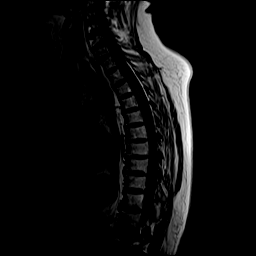
[im 15/19]
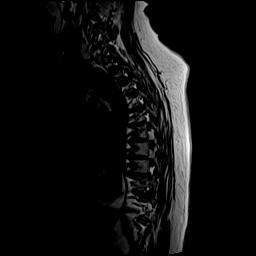
[im 19/19]
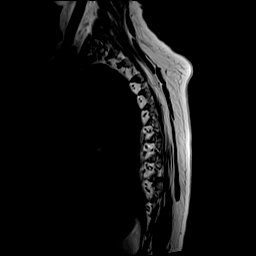

[Series 21: STIR · sagittal · 3.0mm · 0.66mm/px · 6 of 19 slices shown]
[im 1/19]
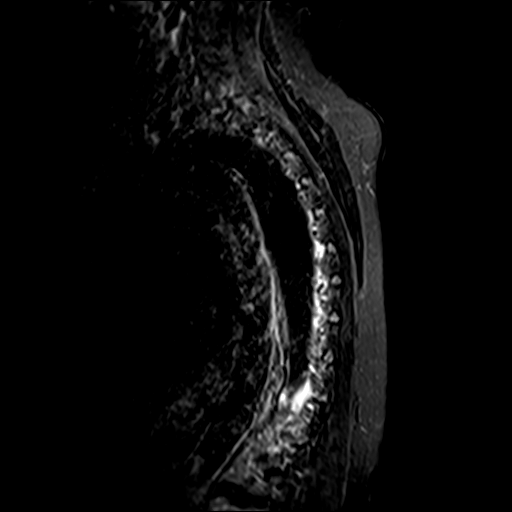
[im 4/19]
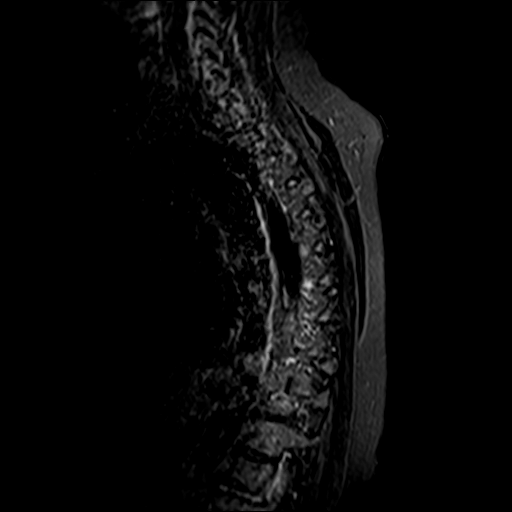
[im 8/19]
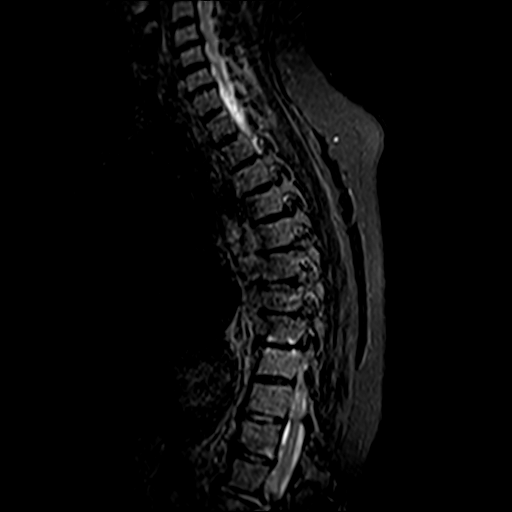
[im 11/19]
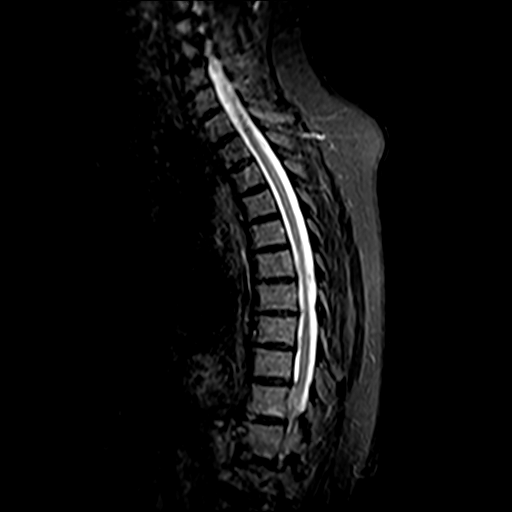
[im 15/19]
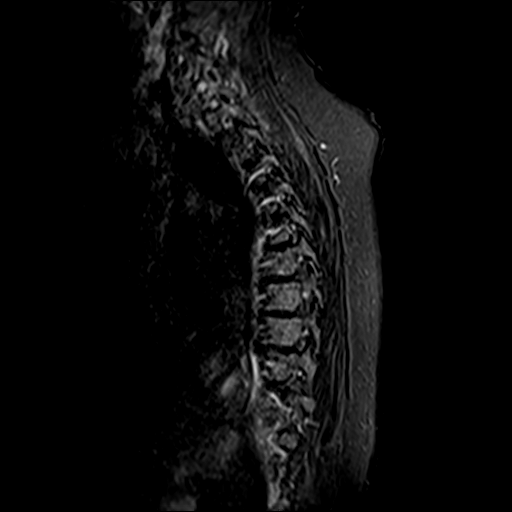
[im 19/19]
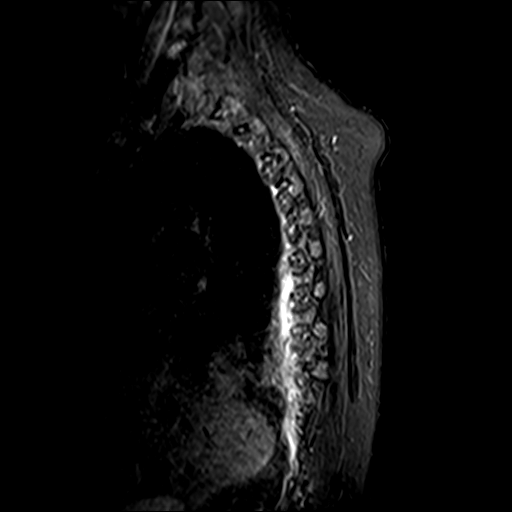

[Series 22: T2 · axial · 4.0mm · 0.59mm/px · z∈[-276,-75]mm · 9 of 39 slices shown (2 of 2)]
[im 1/39]
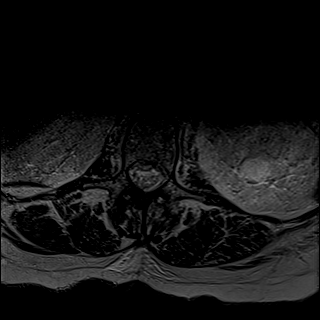
[im 7/39]
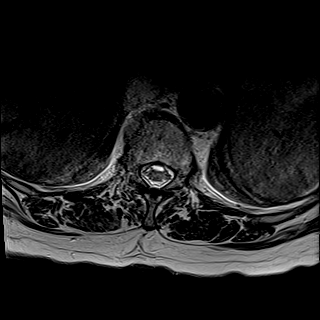
[im 13/39]
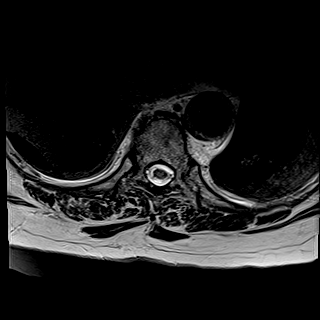
[im 16/39]
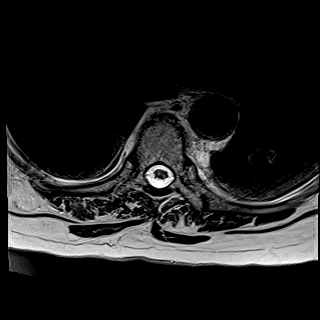
[im 20/39]
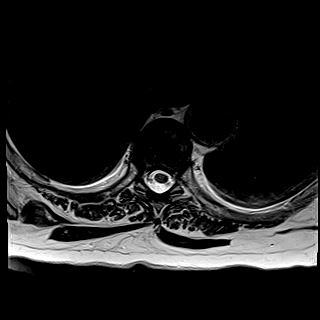
[im 23/39]
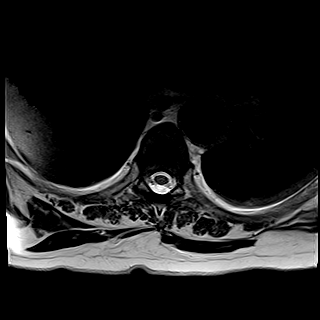
[im 26/39]
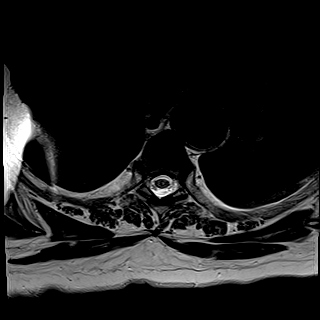
[im 32/39]
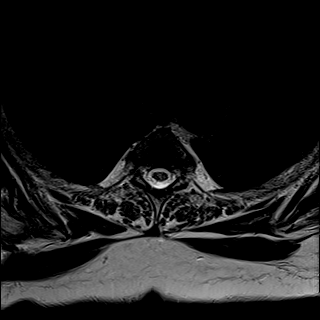
[im 39/39]
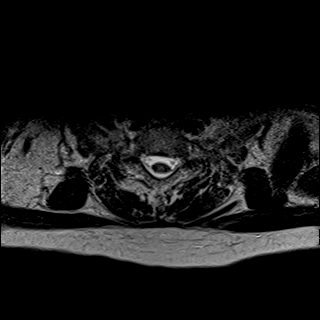

[Series 23: GRE · axial · 4.0mm · 0.37mm/px · 1 of 39 slices shown]
[im 1/39]
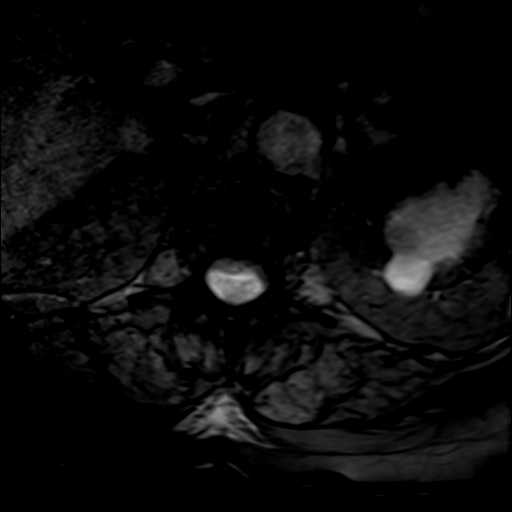

[32 of 48 positions shown; findings below may reference images not displayed]

FINDINGS: MRI THORACIC SPINE FINDINGS

Alignment:  No substantial sagittal subluxation.

Vertebrae: Vertebral body heights are maintained. No focal marrow
edema to suggest osteomyelitis or acute fracture.

Cord:  Normal cord signal.

Paraspinal and other soft tissues: Appreciable paraspinal edema.
Hiatal hernia.

Disc levels:

Mild disc bulging at T7-T8 and T8-T9 without significant canal
stenosis.

Small broad disc bulge with central disc protrusion at T9-T10 with
ligamentum flavum thickening. Resulting mild canal stenosis.

Multilevel facet arthropathy, greatest in the lower thoracic spine
with mild bilateral foraminal stenosis at T8-T9, T9-T10, and
T10-T11. Mild foraminal stenosis on the right at T11-T12. T12-L1
levels discussed below.

MRI LUMBAR SPINE FINDINGS

Segmentation:  Standard.

Alignment:  Grade 1 anterolisthesis L4 on L5.

Vertebrae: Edema within the T12-L1, L4-L5 and L5-S1 discs. Chronic
appearing degenerative endplate signal changes at T12-L1 without
significant marrow edema.

Conus medullaris and cauda equina: Conus extends to the L1 level.
Conus appears normal.

Paraspinal and other soft tissues: Partially imaged dilated
gallbladder with borderline dilated common bile duct, measuring
approximately 6 mm. Left renal cysts.

Disc levels:

T12-L1: Degenerative disc height loss with disc edema. Posterior
disc bulge with moderate bilateral facet hypertrophy. Ligamentum
flavum thickening. Resulting moderate right foraminal stenosis and
mild canal stenosis. No significant left foraminal stenosis.

L1-L2: Left eccentric disc bulge and mild left greater than right
facet hypertrophy. Ligamentum flavum thickening. Resulting mild left
foraminal stenosis without significant canal or right foraminal
stenosis.

L2-L3: Mild disc bulging and mild-to-moderate bilateral facet
arthropathy. No significant canal or foraminal stenosis.

L3-L4: Broad disc bulge with bulky ligamentum flavum thickening and
moderate bilateral facet arthropathy. Resulting mild-to-moderate
canal stenosis without significant foraminal stenosis.

L4-L5: Right eccentric disc height loss. Grade 1 anterolisthesis of
L4 on L5. Uncovering of the disc with superimposed small left
subarticular disc protrusion. Moderate right greater than left facet
hypertrophy. Resulting mild to moderate right subarticular and
foraminal stenosis. No significant canal or left foraminal stenosis.

L5-S1: Left eccentric disc height loss. Left eccentric disc bulge
and endplate spurring. Moderate left mild right facet arthropathy.
Resulting mild-to-moderate left foraminal stenosis with mild left
subarticular recess narrowing. No significant canal or right
foraminal stenosis.
IMPRESSION: MR THORACIC SPINE IMPRESSION

1. Mild canal stenosis at T9-T10. Otherwise, multilevel small disc
bulges without significant canal stenosis.
2. Mild multilevel foraminal stenosis in the lower thoracic spine,
described above.
3. T12-L1 is discussed below.
4. Hiatal hernia.

MR LUMBAR SPINE IMPRESSION

1. At T12-L1, moderate right foraminal stenosis with mild canal
stenosis. Degenerative disc disease at this level with disc edema
and slight left paraspinal edema, most likely
degenerative/inflammatory in etiology. Infectious discitis is
thought less likely but if there is clinical concern for infection
then consider correlation with inflammatory markers and short
interval follow-up MRI with contrast to ensure stability.
2. At L3-L4, mild-to-moderate canal stenosis.
3. At L4-L5, right eccentric degenerative change with
mild-to-moderate right subarticular recess and foraminal stenosis.
4. At L5-S1, left eccentric degenerative change with
mild-to-moderate left foraminal stenosis and mild left subarticular
recess narrowing.
5. Partially imaged dilated gallbladder with borderline dilated
common bile duct. This could be physiologic; however, consider
correlation with liver function enzymes to exclude obstruction.
Right upper quadrant ultrasound could also further evaluate if
clinically indicated.

## 2020-12-11 IMAGING — MR MR LUMBAR SPINE W/O CM
5 series · 31 of 48 positions shown · non-contrast
Comparison: None.

CLINICAL DATA: Low back pain, infection suspected; Mid-back pain,
neuro deficit

EXAM:
MRI THORACIC AND LUMBAR SPINE WITHOUT CONTRAST
TECHNIQUE: Multiplanar and multiecho pulse sequences of the thoracic and lumbar
spine were obtained without intravenous contrast.

[Series 1: T2 · sagittal · 4.0mm · 1.02mm/px · 6 of 16 slices shown (1 of 2)]
[im 1/16]
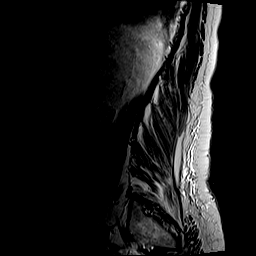
[im 4/16]
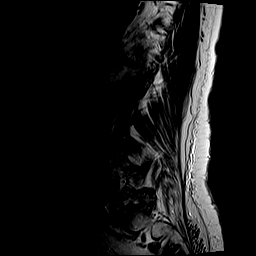
[im 7/16]
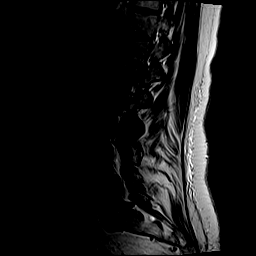
[im 10/16]
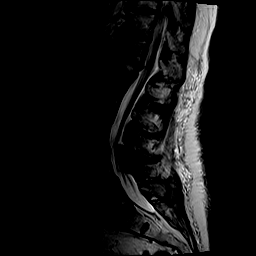
[im 13/16]
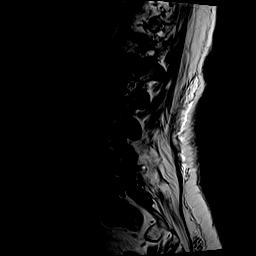
[im 16/16]
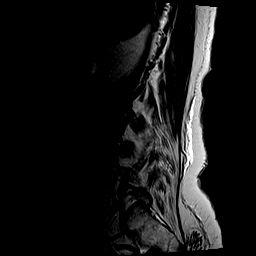

[Series 2: T1 · sagittal · 4.0mm · 1.02mm/px · 7 of 16 slices shown (1 of 2)]
[im 1/16]
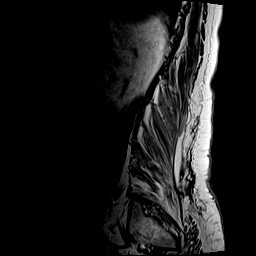
[im 3/16]
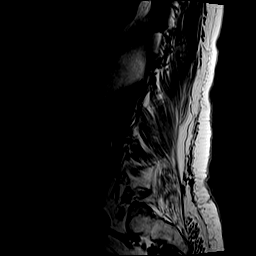
[im 6/16]
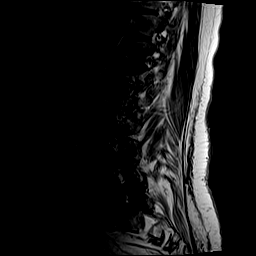
[im 8/16]
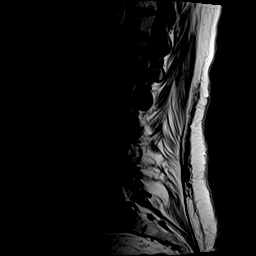
[im 11/16]
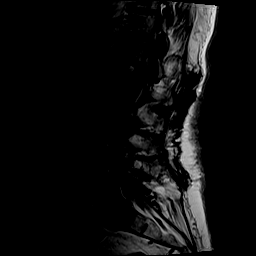
[im 13/16]
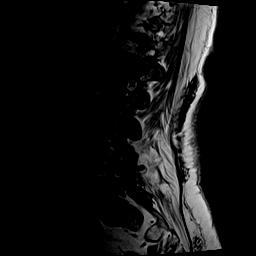
[im 16/16]
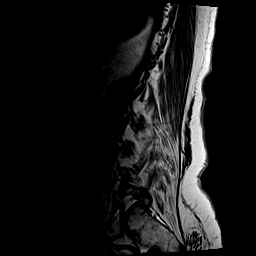

[Series 3: STIR · sagittal · 4.0mm · 0.51mm/px · 2 of 16 slices shown]
[im 1/16]
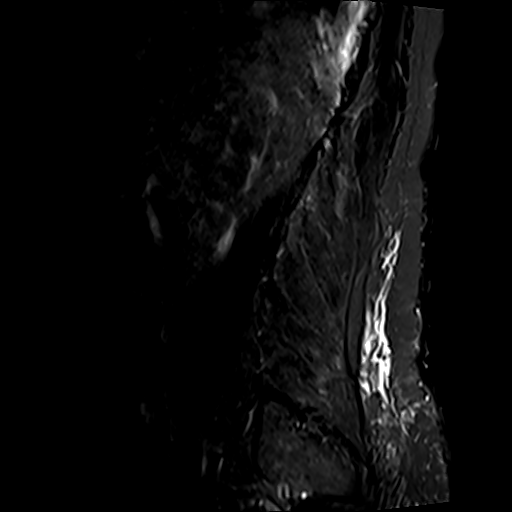
[im 3/16]
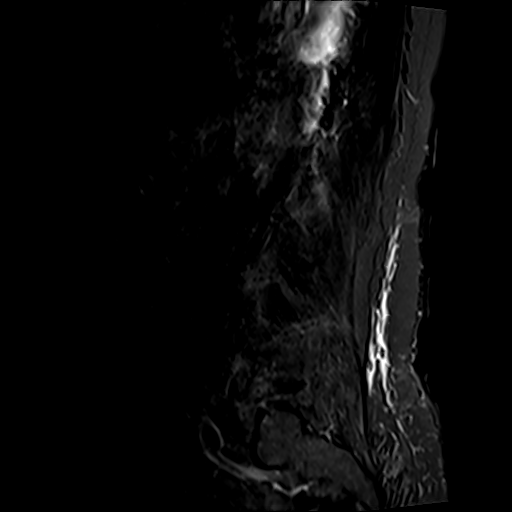

[Series 4: T2 · axial · 4.0mm · 0.78mm/px · z∈[-490,-274]mm · 8 of 34 slices shown (2 of 2)]
[im 1/34]
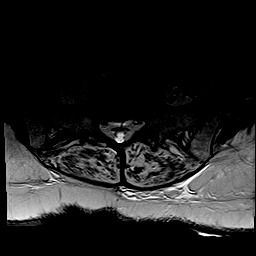
[im 6/34]
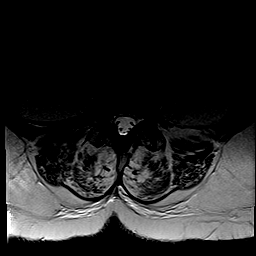
[im 11/34]
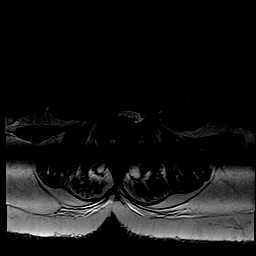
[im 16/34]
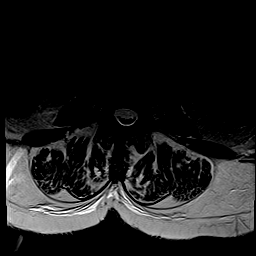
[im 18/34]
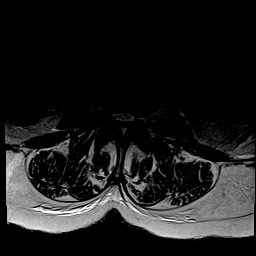
[im 23/34]
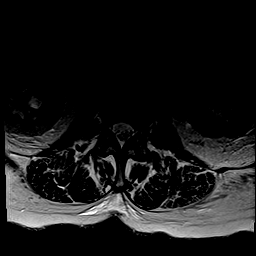
[im 28/34]
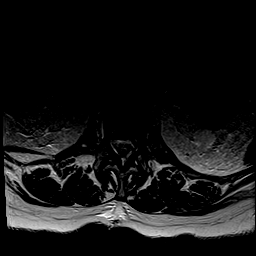
[im 34/34]
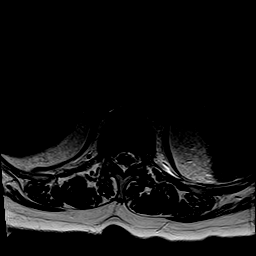

[Series 5: T1 · axial · 4.0mm · 0.39mm/px · z∈[-490,-274]mm · 8 of 34 slices shown (2 of 2)]
[im 1/34]
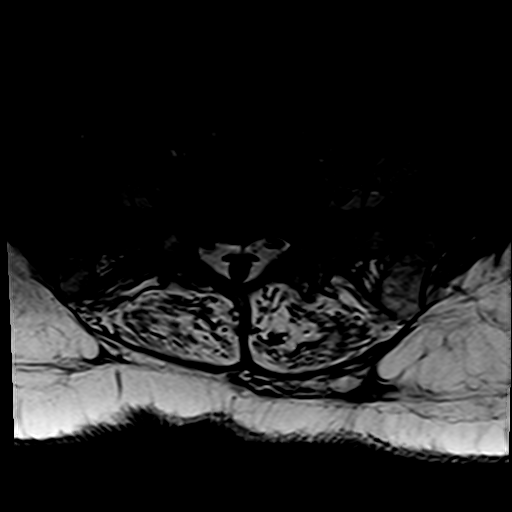
[im 6/34]
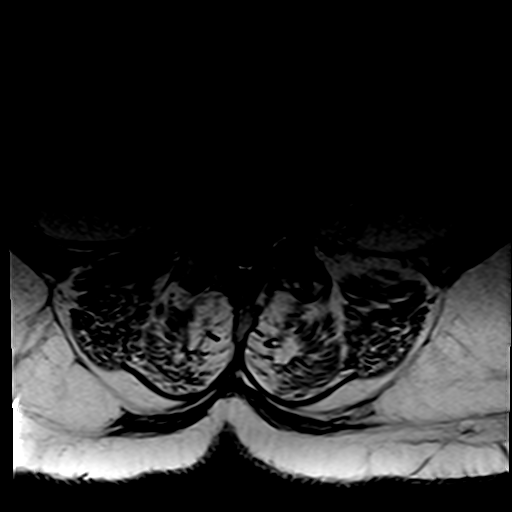
[im 11/34]
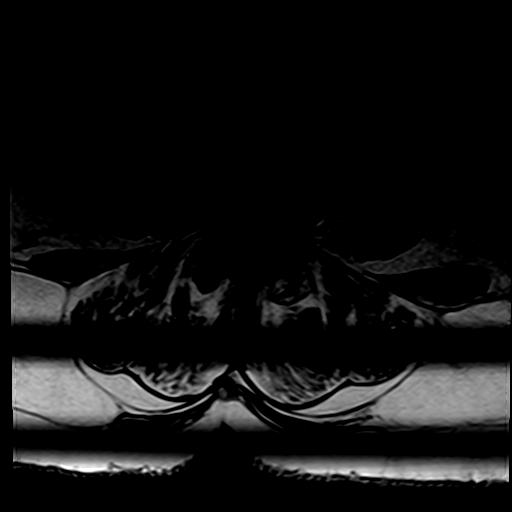
[im 16/34]
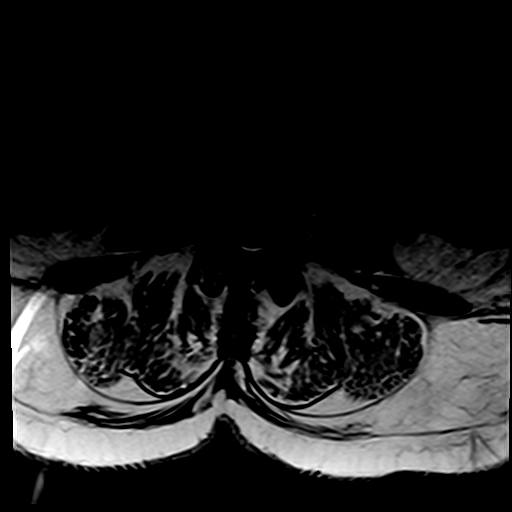
[im 18/34]
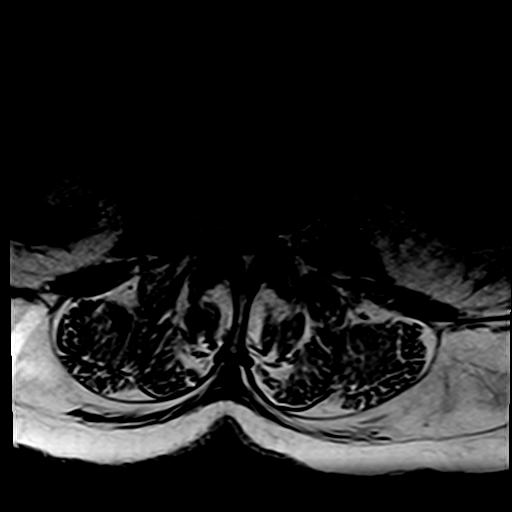
[im 23/34]
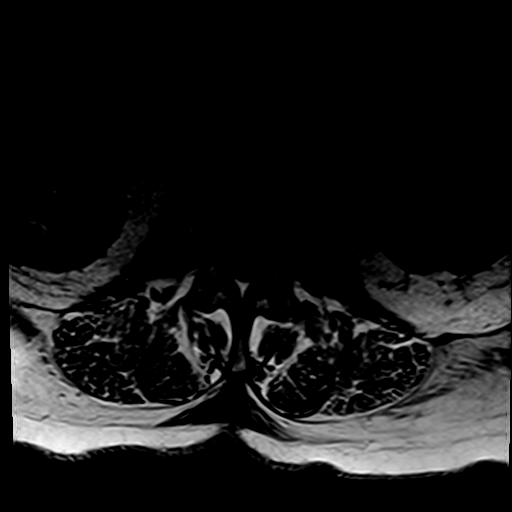
[im 28/34]
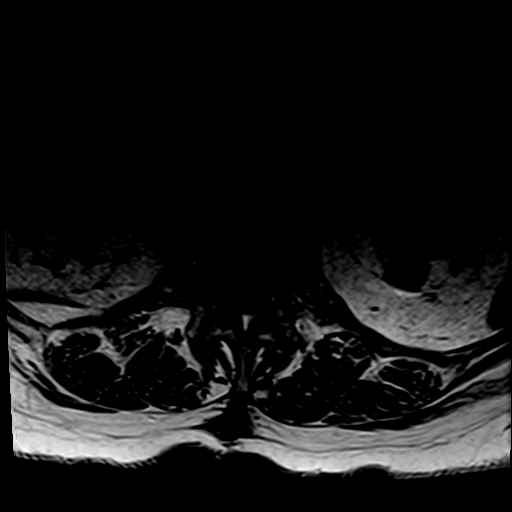
[im 34/34]
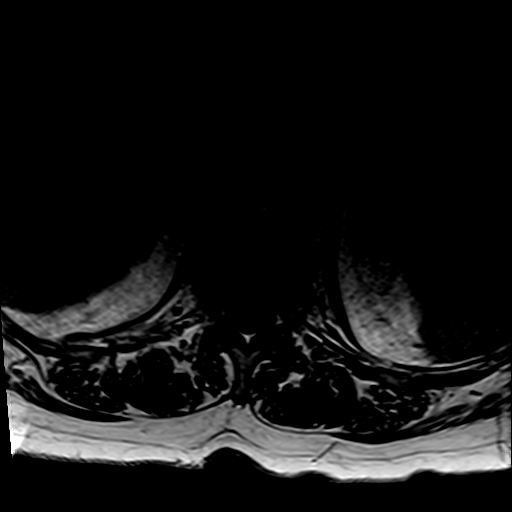

[31 of 48 positions shown; findings below may reference images not displayed]

FINDINGS: MRI THORACIC SPINE FINDINGS

Alignment:  No substantial sagittal subluxation.

Vertebrae: Vertebral body heights are maintained. No focal marrow
edema to suggest osteomyelitis or acute fracture.

Cord:  Normal cord signal.

Paraspinal and other soft tissues: Appreciable paraspinal edema.
Hiatal hernia.

Disc levels:

Mild disc bulging at T7-T8 and T8-T9 without significant canal
stenosis.

Small broad disc bulge with central disc protrusion at T9-T10 with
ligamentum flavum thickening. Resulting mild canal stenosis.

Multilevel facet arthropathy, greatest in the lower thoracic spine
with mild bilateral foraminal stenosis at T8-T9, T9-T10, and
T10-T11. Mild foraminal stenosis on the right at T11-T12. T12-L1
levels discussed below.

MRI LUMBAR SPINE FINDINGS

Segmentation:  Standard.

Alignment:  Grade 1 anterolisthesis L4 on L5.

Vertebrae: Edema within the T12-L1, L4-L5 and L5-S1 discs. Chronic
appearing degenerative endplate signal changes at T12-L1 without
significant marrow edema.

Conus medullaris and cauda equina: Conus extends to the L1 level.
Conus appears normal.

Paraspinal and other soft tissues: Partially imaged dilated
gallbladder with borderline dilated common bile duct, measuring
approximately 6 mm. Left renal cysts.

Disc levels:

T12-L1: Degenerative disc height loss with disc edema. Posterior
disc bulge with moderate bilateral facet hypertrophy. Ligamentum
flavum thickening. Resulting moderate right foraminal stenosis and
mild canal stenosis. No significant left foraminal stenosis.

L1-L2: Left eccentric disc bulge and mild left greater than right
facet hypertrophy. Ligamentum flavum thickening. Resulting mild left
foraminal stenosis without significant canal or right foraminal
stenosis.

L2-L3: Mild disc bulging and mild-to-moderate bilateral facet
arthropathy. No significant canal or foraminal stenosis.

L3-L4: Broad disc bulge with bulky ligamentum flavum thickening and
moderate bilateral facet arthropathy. Resulting mild-to-moderate
canal stenosis without significant foraminal stenosis.

L4-L5: Right eccentric disc height loss. Grade 1 anterolisthesis of
L4 on L5. Uncovering of the disc with superimposed small left
subarticular disc protrusion. Moderate right greater than left facet
hypertrophy. Resulting mild to moderate right subarticular and
foraminal stenosis. No significant canal or left foraminal stenosis.

L5-S1: Left eccentric disc height loss. Left eccentric disc bulge
and endplate spurring. Moderate left mild right facet arthropathy.
Resulting mild-to-moderate left foraminal stenosis with mild left
subarticular recess narrowing. No significant canal or right
foraminal stenosis.
IMPRESSION: MR THORACIC SPINE IMPRESSION

1. Mild canal stenosis at T9-T10. Otherwise, multilevel small disc
bulges without significant canal stenosis.
2. Mild multilevel foraminal stenosis in the lower thoracic spine,
described above.
3. T12-L1 is discussed below.
4. Hiatal hernia.

MR LUMBAR SPINE IMPRESSION

1. At T12-L1, moderate right foraminal stenosis with mild canal
stenosis. Degenerative disc disease at this level with disc edema
and slight left paraspinal edema, most likely
degenerative/inflammatory in etiology. Infectious discitis is
thought less likely but if there is clinical concern for infection
then consider correlation with inflammatory markers and short
interval follow-up MRI with contrast to ensure stability.
2. At L3-L4, mild-to-moderate canal stenosis.
3. At L4-L5, right eccentric degenerative change with
mild-to-moderate right subarticular recess and foraminal stenosis.
4. At L5-S1, left eccentric degenerative change with
mild-to-moderate left foraminal stenosis and mild left subarticular
recess narrowing.
5. Partially imaged dilated gallbladder with borderline dilated
common bile duct. This could be physiologic; however, consider
correlation with liver function enzymes to exclude obstruction.
Right upper quadrant ultrasound could also further evaluate if
clinically indicated.

## 2020-12-11 IMAGING — DX DG CHEST 1V PORT
1 series · 1 of 1 positions shown · non-contrast
Comparison: Chest x-ray [DATE].

CLINICAL DATA: 75-year-old female with history of acute respiratory
failure with hypoxemia.

EXAM:
PORTABLE CHEST 1 VIEW

[chest ap]
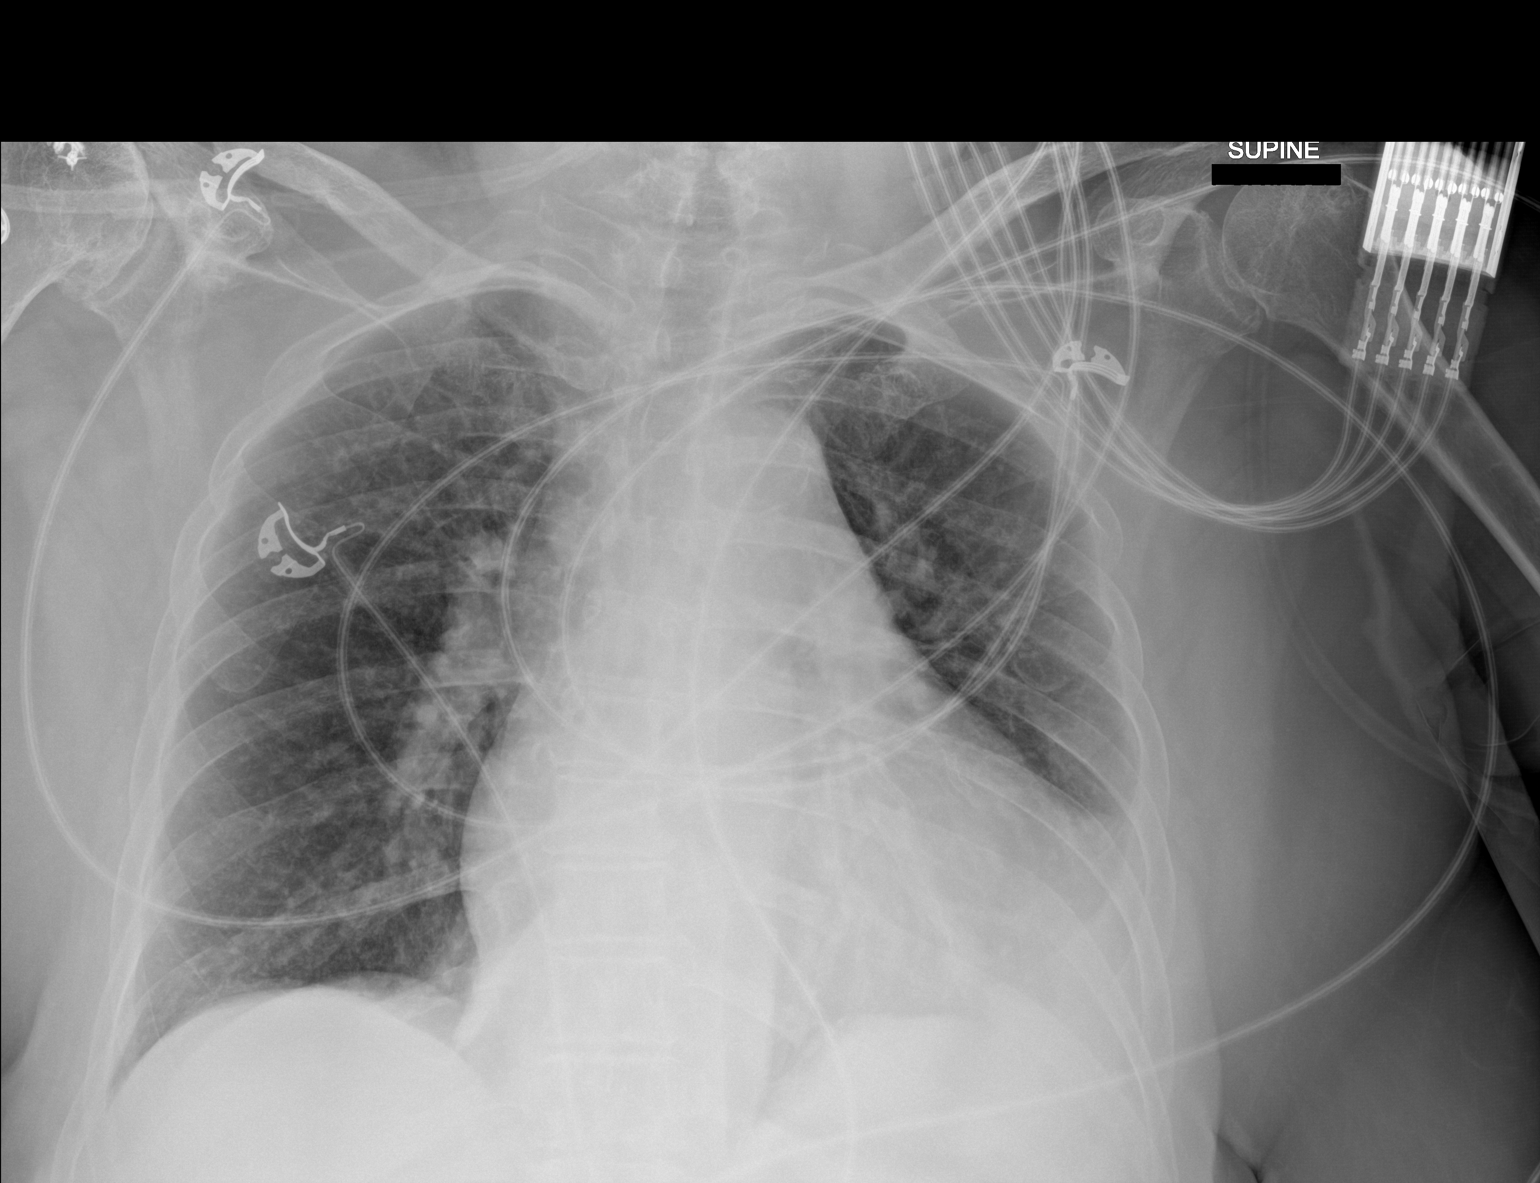

[1 of 1 positions shown; findings below may reference images not displayed]

FINDINGS: Lung volumes are normal. No consolidative airspace disease. Linear
opacities of the left lung base, similar to the prior study, favored
to reflect subsegmental atelectasis or scarring. Possible small left
pleural effusion. No right pleural effusion. No pneumothorax. No
evidence of pulmonary edema. Mild cardiomegaly. Aortic
atherosclerosis.
IMPRESSION: 1. Possible small left pleural effusion with subsegmental
atelectasis in the left lower lobe.
2. Cardiomegaly.
3. Aortic atherosclerosis.

## 2020-12-11 MED ORDER — CHLORHEXIDINE GLUCONATE CLOTH 2 % EX PADS
6.0000 | MEDICATED_PAD | Freq: Every day | CUTANEOUS | Status: DC
  Administered 2020-12-11 – 2020-12-19 (×8): 6 via TOPICAL

## 2020-12-11 MED ORDER — STERILE WATER FOR INJECTION IV SOLN
INTRAVENOUS | Status: DC
  Filled 2020-12-11 (×2): qty 1000
  Filled 2020-12-11: qty 150
  Filled 2020-12-11: qty 1000
  Filled 2020-12-11: qty 150
  Filled 2020-12-11: qty 1000
  Filled 2020-12-11: qty 150
  Filled 2020-12-11 (×2): qty 1000

## 2020-12-11 MED ORDER — MIDODRINE HCL 5 MG PO TABS
10.0000 mg | ORAL_TABLET | Freq: Three times a day (TID) | ORAL | Status: DC
  Administered 2020-12-11 – 2020-12-18 (×15): 10 mg via ORAL
  Filled 2020-12-11 (×15): qty 2

## 2020-12-11 MED ORDER — HEPARIN BOLUS VIA INFUSION
2000.0000 [IU] | Freq: Once | INTRAVENOUS | Status: AC
  Administered 2020-12-11: 2000 [IU] via INTRAVENOUS
  Filled 2020-12-11: qty 2000

## 2020-12-11 MED ORDER — HEPARIN BOLUS VIA INFUSION
1050.0000 [IU] | Freq: Once | INTRAVENOUS | Status: AC
  Administered 2020-12-11: 1050 [IU] via INTRAVENOUS
  Filled 2020-12-11: qty 1050

## 2020-12-11 NOTE — Progress Notes (Addendum)
ANTICOAGULATION CONSULT NOTE  Pharmacy Consult for heparin infusion Indication: nSTEMI/ACS  Allergies  Allergen Reactions   Sulfa Antibiotics Rash    Mouth blisters Other reaction(s): Angioedema "whole mouth swells"   Gabapentin Diarrhea    Patient Measurements: Height: 5' (152.4 cm) Weight: 102.1 kg (225 lb) IBW/kg (Calculated) : 45.5 Heparin Dosing Weight: 70.4 kg  Vital Signs: Temp: 97.8 F (36.6 C) (09/23 1600) Temp Source: Oral (09/23 1600) BP: 106/90 (09/23 1800) Pulse Rate: 75 (09/23 1800)  Labs: Recent Labs    12/09/20 0415 12/09/20 0616 12/09/20 0920 12/09/20 1259 12/09/20 1519 12/09/20 1558 12/10/20 0721 12/10/20 1305 12/10/20 2318 12/11/20 0627 12/11/20 1815  HGB 12.3  --   --   --   --   --  10.7*  --   --   --   --   HCT 37.6  --   --   --   --   --  33.0*  --   --   --   --   PLT 190  --   --   --   --   --  150  --   --   --   --   APTT  --  28  --   --   --   --   --   --   --   --   --   LABPROT  --  14.1  --   --   --   --   --   --   --   --   --   INR  --  1.1  --   --   --   --   --   --   --   --   --   HEPARINUNFRC  --   --   --   --   --    < >  --  0.33  --  0.19* 0.21*  CREATININE 2.02*  --   --   --   --   --  4.21*  --  5.17*  --   --   TROPONINIHS 105* 93*   < > 113* 133*  --  156*  --   --   --   --    < > = values in this interval not displayed.     Estimated Creatinine Clearance: 10.4 mL/min (A) (by C-G formula based on SCr of 5.17 mg/dL (H)).   Medical History: Past Medical History:  Diagnosis Date   Anxiety    Bell's palsy 08/2018   CHF (congestive heart failure) (HCC)    Chronic back pain    Chronic cough    CKD (chronic kidney disease), stage III (HCC)    Depression    Diabetes mellitus, type 2 (HCC)    GERD (gastroesophageal reflux disease)    Hyperlipidemia    Hypertension    Multilevel degenerative disc disease    Myocardial infarction [facility]) 2007   & 2014   Neuropathy    Osteoporosis      Assessment: Pt is 75 yo female presenting with generalized weakness, found w/ possible nSTEMI/ACS. Cardiology following. Do not suspect NSTEMI. Patient with atrial fibrillation, started on amiodarone drip and continues on heparin.  Goal of Therapy:  Heparin level 0.3-0.7 units/ml Monitor platelets by anticoagulation protocol: Yes   9/21 1558 HL 0.28, subtherapeutic 9/22 0225 HL 0.33, therapeutic x 1 9/22 1305 HL 0.33, therapeutic x2 9/23 0627 HL 0.19, subtherapeutic 9/23 1815 HL 0.21,  subtherapeutic   Plan:  Heparin level subtherapeutic Heparin 70964 unit bolus Increase heparin infusion to 1300 units/hr Check HL 8 hours following rate change CBC daily while on heparin  Sharen Hones, PharmD, BCPS Clinical Pharmacist 12/11/2020 6:54 PM

## 2020-12-11 NOTE — Progress Notes (Signed)
PROGRESS NOTE    Jamie Benitez  HQI:[id number] DOB: 09-07-1945 DOA: 12/09/2020 PCP: Kandyce Rud, MD    Brief Narrative:  75 y.o. female with medical history significant of sCHF with EF 35%, CAD with stent placement, CKD-3A, chronic back pain, Bell's palsy, HTN, hyperlipidemia, diabetes mellitus, GERD, depression with anxiety, who presents with weakness.   Patient states that she has generalized weakness for more than 4 days.  No unilateral numbness or tingling to extremities.  No facial droop or slurred speech.  Patient has dry cough, denies chest pain or shortness breath.  No fever or chills.  Patient states that she had diarrhea in the past several days, which has resolved.  Currently no nausea, vomiting, diarrhea or abdominal pain.  No symptoms of UTI.  Patient complains of chronic lower back pain. Initially patient had oxygen desaturation to mid 80s, which improved to 98% on room air in ED.   Initial trop 105 with ST depression and T wave inversion in inferior leads and anterior leads.  Dr. Okey Dupre of cardiology was consulted, who did not think patient had STEMI. He recommended [facility] cardiology consult. IV heparin is started in ED  Cardiology patient's presentation is inconsistent with NSTEMI.  No significant delta in troponin.  VQ scan and lower extremity duplex negative for VTE.  Patient did have elements of mild BNP elevation and small left pleural effusion possible decompensated heart failure.  Was not given diuretics due to elevated kidney function.  Subsequently patient went into a tachyarrhythmia likely atrial fibrillation which she does not have a history of.  Also creatinine worsened from 2-4.2.  Nephrology consult 9/22.  9/23: Patient was moved to stepdown unit for closer monitoring due to persistent hypotension.  Now requiring vasopressor support.  Remains on amiodarone.  Remains in atrial fibrillation, rate control improved.   Assessment & Plan:   Principal Problem:   NSTEMI  (non-ST elevated myocardial infarction) (HCC) Active Problems:   Chronic systolic CHF (congestive heart failure) (HCC)   Chronic back pain   Acute renal failure superimposed on stage 3a chronic kidney disease (HCC)   Depression with anxiety   Type II diabetes mellitus with renal manifestations (HCC)   CAD (coronary artery disease)   HLD (hyperlipidemia)   HTN (hypertension)   Hypokalemia   Leukocytosis   Acute decompensated heart failure (HCC)  NSTEMI and history of CAD: S/p of stent placement  trop  105 --> 93 --> 93.   Patient does not have chest pain, but has generalized weakness.   Dr. Juliann Pares of cardiology is consulted.   D-dimer is positive, but VQ scan negative for PE.   Lower extremity Dopplers negative for DVT. Per cardiology patient's presentation is inconsistent with NSTEMI Plan: Continue heparin GTT for now given new onset atrial fibrillation Continue antiplatelet therapy High intensity statin Telemetry monitoring  New onset atrial fibrillation with rapid ventricular response Noted on telemetry 9/22 Ventricular rates up to 130s Plan: Amiodarone gtt. for today Will transition to p.o. amiodarone 200 twice daily if able to tolerate p.o. by tomorrow   Chronic systolic CHF (congestive heart failure) (HCC) 2D echo on 05/22/2017 showed EF of 35.  Patient has 1+ leg edema, elevated BNP 398, indicating possible fluid overload.   Since patient has worsening renal function, will not start diuretics now -Watch volume status closely   Chronic back pain -As needed Tylenol -continue home tramadol   Acute renal failure superimposed on stage 3a chronic kidney disease (HCC):  Metabolic acidosis  recent baseline  creatinine 1.2 on 10/28/2019.   Her creatinine is at 2.02, BUN 35 on admission Creatinine progressively worsening Nephrology consulted Plan: IV bicarbonate Avoid diuretics and nephrotoxins Daily renal function   Depression with anxiety -Continue home  medications   Type II diabetes mellitus with renal manifestations (HCC) Recent A1c 5.8, well controlled.   Patient is taking 70/30 NPH insulin and glucose at home. -Sliding scale insulin -Decrease home 70/30 NPH insulin dose from 10 to 8 units twice daily   HLD (hyperlipidemia) -Lipitor   HTN (hypertension) -Hold lisinopril -IV hydralazine as needed -Imdur on hold   Hypokalemia Monitor and replace as necessary Maintain K greater than 4, mag greater than 2  Possible MSSA bacteremia Unclear what this this represents infection or true contaminant Does not meet sepsis criteria No clear source identified Plan: Continue Ancef Check TTE MRI lumbar spine rule out spinal abscess  Possible bile duct dilatation Noted partial on MRI Will check LFTs If elevated will get dedicated biliary tract imaging  DVT prophylaxis: Heparin GTT Code Status: DNR Family Communication: Lanier Prude [phone] on 9/22 Disposition Plan: Status is: Inpatient  Remains inpatient appropriate because:Inpatient level of care appropriate due to severity of illness  Dispo: The patient is from: Home              Anticipated d/c is to: SNF              Patient currently is not medically stable to d/c.   Difficult to place patient No  Patient with numerous acute issues.  Currently in stepdown status.  Disposition plan pending.     Level of care: Stepdown  Consultants:  Nephrology Cardiology  Procedures:  None  Antimicrobials:  Cefazolin   Subjective: Patient seen and examined.  Lethargic this morning.  Asleep but easily awakened.  Answers all questions appropriately.  Appears uncomfortable  Objective: Vitals:   12/11/20 0930 12/11/20 0945 12/11/20 1000 12/11/20 1015  BP: 95/66 123/78 (!) 99/57 97/73  Pulse: (!) 57     Resp: (!) 24 (!) 25 (!) 25 19  Temp:      TempSrc:      SpO2: 98%     Weight:      Height:        Intake/Output Summary (Last 24 hours) at 12/11/2020  1321 Last data filed at 12/11/2020 0500 Gross per 24 hour  Intake 1780.97 ml  Output 0 ml  Net 1780.97 ml   Filed Weights   12/09/20 0354  Weight: 102.1 kg    Examination:  General exam: Appears lethargic.  Chronically ill Respiratory system: Poor respiratory effort.  Lungs clear.  Normal work of breathing.  1 L Cardiovascular system: S1-S2, irregular rhythm, tachycardic, no murmurs Gastrointestinal system: Soft, nontender, nondistended, normal bowel sounds Central nervous system: Lethargic but oriented.  No focal deficits Extremities: Diffusely decreased power bilaterally.  Range of motion intact Skin: No rashes, lesions or ulcers Psychiatry: Judgement and insight appear normal. Mood & affect appropriate.     Data Reviewed: I have personally reviewed following labs and imaging studies  CBC: Recent Labs  Lab 12/09/20 0415 12/10/20 0721  WBC 11.4* 13.9*  NEUTROABS 9.1*  --   HGB 12.3 10.7*  HCT 37.6 33.0*  MCV 86.0 84.2  PLT 190 150   Basic Metabolic Panel: Recent Labs  Lab 12/09/20 0415 12/10/20 0721 12/10/20 2318  NA 134* 132* 130*  K 3.4* 3.8 3.8  CL 100 98 100  CO2 22 18* 18*  GLUCOSE 238* 113*  128*  BUN 35* 57* 65*  CREATININE 2.02* 4.21* 5.17*  CALCIUM 8.5* 8.0* 7.3*  MG 1.8  --   --    GFR: Estimated Creatinine Clearance: 10.4 mL/min (A) (by C-G formula based on SCr of 5.17 mg/dL (H)). Liver Function Tests: Recent Labs  Lab 12/09/20 0415 12/11/20 0627  AST 37  --   ALT 24  --   ALKPHOS 85  --   BILITOT 1.2  --   PROT 6.9  --   ALBUMIN 3.5 2.4*   Recent Labs  Lab 12/09/20 0415  LIPASE 35   No results for input(s): AMMONIA in the last 168 hours. Coagulation Profile: Recent Labs  Lab 12/09/20 0616  INR 1.1   Cardiac Enzymes: No results for input(s): CKTOTAL, CKMB, CKMBINDEX, TROPONINI in the last 168 hours. BNP (last 3 results) No results for input(s): PROBNP in the last 8760 hours. HbA1C: Recent Labs    12/09/20 0920  HGBA1C  5.8*   CBG: Recent Labs  Lab 12/10/20 1214 12/10/20 2223 12/11/20 0422 12/11/20 0928 12/11/20 1230  GLUCAP 119* 114* 121* 125* 96   Lipid Profile: Recent Labs    12/10/20 0721  CHOL 94  HDL 33*  LDLCALC 37  TRIG 161  CHOLHDL 2.8   Thyroid Function Tests: Recent Labs    12/11/20 0627  TSH 2.222   Anemia Panel: No results for input(s): VITAMINB12, FOLATE, FERRITIN, TIBC, IRON, RETICCTPCT in the last 72 hours. Sepsis Labs: Recent Labs  Lab 12/09/20 0434 12/11/20 0627 12/11/20 0832 12/11/20 1040  LATICACIDVEN 1.8 1.2 1.3 1.7    Recent Results (from the past 240 hour(s))  Resp Panel by RT-PCR (Flu A&B, Covid) Nasopharyngeal Swab     Status: None   Collection Time: 12/09/20  4:34 AM   Specimen: Nasopharyngeal Swab; Nasopharyngeal(NP) swabs in vial transport medium  Result Value Ref Range Status   SARS Coronavirus 2 by RT PCR NEGATIVE NEGATIVE Final    Comment: (NOTE) SARS-CoV-2 target nucleic acids are NOT DETECTED.  The SARS-CoV-2 RNA is generally detectable in upper respiratory specimens during the acute phase of infection. The lowest concentration of SARS-CoV-2 viral copies this assay can detect is 138 copies/mL. A negative result does not preclude SARS-Cov-2 infection and should not be used as the sole basis for treatment or other patient management decisions. A negative result may occur with  improper specimen collection/handling, submission of specimen other than nasopharyngeal swab, presence of viral mutation(s) within the areas targeted by this assay, and inadequate number of viral copies(<138 copies/mL). A negative result must be combined with clinical observations, patient history, and epidemiological information. The expected result is Negative.  Fact Sheet for Patients:  BloggerCourse.com  Fact Sheet for Healthcare Providers:  SeriousBroker.it  This test is no t yet approved or cleared by the  [place] FDA and  has been authorized for detection and/or diagnosis of SARS-CoV-2 by FDA under an Emergency Use Authorization (EUA). This EUA will remain  in effect (meaning this test can be used) for the duration of the COVID-19 declaration under Section 564(b)(1) of the Act, 21 U.S.C.section 360bbb-3(b)(1), unless the authorization is terminated  or revoked sooner.       Influenza A by PCR NEGATIVE NEGATIVE Final   Influenza B by PCR NEGATIVE NEGATIVE Final    Comment: (NOTE) The Xpert Xpress SARS-CoV-2/FLU/RSV plus assay is intended as an aid in the diagnosis of influenza from Nasopharyngeal swab specimens and should not be used as a sole basis for treatment. Nasal  washings and aspirates are unacceptable for Xpert Xpress SARS-CoV-2/FLU/RSV testing.  Fact Sheet for Patients: BloggerCourse.com  Fact Sheet for Healthcare Providers: SeriousBroker.it  This test is not yet approved or cleared by the [place] FDA and has been authorized for detection and/or diagnosis of SARS-CoV-2 by FDA under an Emergency Use Authorization (EUA). This EUA will remain in effect (meaning this test can be used) for the duration of the COVID-19 declaration under Section 564(b)(1) of the Act, 21 U.S.C. section 360bbb-3(b)(1), unless the authorization is terminated or revoked.  Performed at [facility], [address] [place], [place] [address]   Blood culture (routine single)     Status: Abnormal   Collection Time: 12/09/20  4:34 AM   Specimen: BLOOD  Result Value Ref Range Status   Specimen Description   Final    BLOOD LEFT ARM Performed at [facility], [address], [place], [place] [address]    Special Requests   Final    BOTTLES DRAWN AEROBIC AND ANAEROBIC Blood Culture adequate volume Performed at [facility], [address] [place], [place] [address]    Culture  Setup Time   Final    GRAM  POSITIVE COCCI IN BOTH AEROBIC AND ANAEROBIC BOTTLES CRITICAL RESULT CALLED TO, READ BACK BY AND VERIFIED WITH: Lelon Mast RAEUR AT 1750 ON 12/09/20 BY SS Performed at [facility], [address], [place], [place] [address]    Culture STAPHYLOCOCCUS AUREUS (A)  Final   Report Status 12/11/2020 FINAL  Final   Organism ID, Bacteria STAPHYLOCOCCUS AUREUS  Final      Susceptibility   Staphylococcus aureus - MIC*    CIPROFLOXACIN 1 SENSITIVE Sensitive     ERYTHROMYCIN <=0.25 SENSITIVE Sensitive     GENTAMICIN <=0.5 SENSITIVE Sensitive     OXACILLIN 0.5 SENSITIVE Sensitive     TETRACYCLINE <=1 SENSITIVE Sensitive     VANCOMYCIN <=0.5 SENSITIVE Sensitive     TRIMETH/SULFA <=10 SENSITIVE Sensitive     CLINDAMYCIN <=0.25 SENSITIVE Sensitive     RIFAMPIN <=0.5 SENSITIVE Sensitive     Inducible Clindamycin NEGATIVE Sensitive     * STAPHYLOCOCCUS AUREUS  Blood Culture ID Panel (Reflexed)     Status: Abnormal   Collection Time: 12/09/20  4:34 AM  Result Value Ref Range Status   Enterococcus faecalis NOT DETECTED NOT DETECTED Final   Enterococcus Faecium NOT DETECTED NOT DETECTED Final   Listeria monocytogenes NOT DETECTED NOT DETECTED Final   Staphylococcus species DETECTED (A) NOT DETECTED Final    Comment: CRITICAL RESULT CALLED TO, READ BACK BY AND VERIFIED WITH: SAMANTHA RAEUR AT 1750 ON 12/09/20 BY SS    Staphylococcus aureus (BCID) DETECTED (A) NOT DETECTED Final    Comment: CRITICAL RESULT CALLED TO, READ BACK BY AND VERIFIED WITH: SAMANTHA RAEUR AT 1750 ON 12/09/20 BY SS    Staphylococcus epidermidis NOT DETECTED NOT DETECTED Final   Staphylococcus lugdunensis NOT DETECTED NOT DETECTED Final   Streptococcus species NOT DETECTED NOT DETECTED Final   Streptococcus agalactiae NOT DETECTED NOT DETECTED Final   Streptococcus pneumoniae NOT DETECTED NOT DETECTED Final   Streptococcus pyogenes NOT DETECTED NOT DETECTED Final   A.calcoaceticus-baumannii NOT DETECTED NOT DETECTED Final    Bacteroides fragilis NOT DETECTED NOT DETECTED Final   Enterobacterales NOT DETECTED NOT DETECTED Final   Enterobacter cloacae complex NOT DETECTED NOT DETECTED Final   Escherichia coli NOT DETECTED NOT DETECTED Final   Klebsiella aerogenes NOT DETECTED NOT DETECTED Final   Klebsiella oxytoca NOT  DETECTED NOT DETECTED Final   Klebsiella pneumoniae NOT DETECTED NOT DETECTED Final   Proteus species NOT DETECTED NOT DETECTED Final   Salmonella species NOT DETECTED NOT DETECTED Final   Serratia marcescens NOT DETECTED NOT DETECTED Final   Haemophilus influenzae NOT DETECTED NOT DETECTED Final   Neisseria meningitidis NOT DETECTED NOT DETECTED Final   Pseudomonas aeruginosa NOT DETECTED NOT DETECTED Final   Stenotrophomonas maltophilia NOT DETECTED NOT DETECTED Final   Candida albicans NOT DETECTED NOT DETECTED Final   Candida auris NOT DETECTED NOT DETECTED Final   Candida glabrata NOT DETECTED NOT DETECTED Final   Candida krusei NOT DETECTED NOT DETECTED Final   Candida parapsilosis NOT DETECTED NOT DETECTED Final   Candida tropicalis NOT DETECTED NOT DETECTED Final   Cryptococcus neoformans/gattii NOT DETECTED NOT DETECTED Final   Meth resistant mecA/C and MREJ NOT DETECTED NOT DETECTED Final    Comment: Performed at [facility], [address], [place], [place] [address]  Urine Culture     Status: Abnormal   Collection Time: 12/09/20  9:24 PM   Specimen: In/Out Cath Urine  Result Value Ref Range Status   Specimen Description   Final    IN/OUT CATH URINE Performed at [facility], [address], [place], [place] [address]    Special Requests   Final    NONE Performed at [facility], [address] [place], [place] [address]    Culture MULTIPLE SPECIES PRESENT, SUGGEST RECOLLECTION (A)  Final   Report Status 12/11/2020 FINAL  Final  CULTURE, BLOOD (ROUTINE X 2) w Reflex to ID Panel     Status: None (Preliminary result)   Collection Time:  12/10/20  4:29 PM   Specimen: BLOOD  Result Value Ref Range Status   Specimen Description BLOOD LEFT ANTECUBITAL  Final   Special Requests   Final    BOTTLES DRAWN AEROBIC ONLY Blood Culture adequate volume   Culture   Final    NO GROWTH < 24 HOURS Performed at [facility], [address] [place], [place] [address]    Report Status PENDING  Incomplete  CULTURE, BLOOD (ROUTINE X 2) w Reflex to ID Panel     Status: None (Preliminary result)   Collection Time: 12/10/20  5:19 PM   Specimen: BLOOD  Result Value Ref Range Status   Specimen Description BLOOD BLOOD LEFT FOREARM  Final   Special Requests   Final    BOTTLES DRAWN AEROBIC AND ANAEROBIC Blood Culture adequate volume   Culture   Final    NO GROWTH < 24 HOURS Performed at [facility], [address] [place], [place] [address]    Report Status PENDING  Incomplete  MRSA Next Gen by PCR, Nasal     Status: None   Collection Time: 12/11/20  4:29 AM   Specimen: Nasal Mucosa; Nasal Swab  Result Value Ref Range Status   MRSA by PCR Next Gen NOT DETECTED NOT DETECTED Final    Comment: (NOTE) The GeneXpert MRSA Assay (FDA approved for NASAL specimens only), is one component of a comprehensive MRSA colonization surveillance program. It is not intended to diagnose MRSA infection nor to guide or monitor treatment for MRSA infections. Test performance is not FDA approved in patients less than 25 years old. Performed at [facility], [address] [place], [place] [address]          Radiology Studies: MR THORACIC SPINE WO CONTRAST  Result Date: 12/11/2020 CLINICAL DATA:  Low back pain, infection suspected; Mid-back pain, neuro deficit EXAM: MRI THORACIC AND LUMBAR SPINE WITHOUT CONTRAST TECHNIQUE: Multiplanar and multiecho pulse sequences of the thoracic and lumbar spine were obtained without intravenous contrast. COMPARISON:  None. FINDINGS: MRI THORACIC SPINE FINDINGS Alignment:  No substantial  sagittal subluxation. Vertebrae: Vertebral body heights are maintained. No focal marrow edema to suggest osteomyelitis or acute fracture. Cord:  Normal cord signal. Paraspinal and other soft tissues: Appreciable paraspinal edema. Hiatal hernia. Disc levels: Mild disc bulging at T7-T8 and T8-T9 without significant canal stenosis. Small broad disc bulge with central disc protrusion at T9-T10 with ligamentum flavum thickening. Resulting mild canal stenosis. Multilevel facet arthropathy, greatest in the lower thoracic spine with mild bilateral foraminal stenosis at T8-T9, T9-T10, and T10-T11. Mild foraminal stenosis on the right at T11-T12. T12-L1 levels discussed below. MRI LUMBAR SPINE FINDINGS Segmentation:  Standard. Alignment:  Grade 1 anterolisthesis L4 on L5. Vertebrae: Edema within the T12-L1, L4-L5 and L5-S1 discs. Chronic appearing degenerative endplate signal changes at T12-L1 without significant marrow edema. Conus medullaris and cauda equina: Conus extends to the L1 level. Conus appears normal. Paraspinal and other soft tissues: Partially imaged dilated gallbladder with borderline dilated common bile duct, measuring approximately 6 mm. Left renal cysts. Disc levels: T12-L1: Degenerative disc height loss with disc edema. Posterior disc bulge with moderate bilateral facet hypertrophy. Ligamentum flavum thickening. Resulting moderate right foraminal stenosis and mild canal stenosis. No significant left foraminal stenosis. L1-L2: Left eccentric disc bulge and mild left greater than right facet hypertrophy. Ligamentum flavum thickening. Resulting mild left foraminal stenosis without significant canal or right foraminal stenosis. L2-L3: Mild disc bulging and mild-to-moderate bilateral facet arthropathy. No significant canal or foraminal stenosis. L3-L4: Broad disc bulge with bulky ligamentum flavum thickening and moderate bilateral facet arthropathy. Resulting mild-to-moderate canal stenosis without significant  foraminal stenosis. L4-L5: Right eccentric disc height loss. Grade 1 anterolisthesis of L4 on L5. Uncovering of the disc with superimposed small left subarticular disc protrusion. Moderate right greater than left facet hypertrophy. Resulting mild to moderate right subarticular and foraminal stenosis. No significant canal or left foraminal stenosis. L5-S1: Left eccentric disc height loss. Left eccentric disc bulge and endplate spurring. Moderate left mild right facet arthropathy. Resulting mild-to-moderate left foraminal stenosis with mild left subarticular recess narrowing. No significant canal or right foraminal stenosis. IMPRESSION: MR THORACIC SPINE IMPRESSION 1. Mild canal stenosis at T9-T10. Otherwise, multilevel small disc bulges without significant canal stenosis. 2. Mild multilevel foraminal stenosis in the lower thoracic spine, described above. 3. T12-L1 is discussed below. 4. Hiatal hernia. MR LUMBAR SPINE IMPRESSION 1. At T12-L1, moderate right foraminal stenosis with mild canal stenosis. Degenerative disc disease at this level with disc edema and slight left paraspinal edema, most likely degenerative/inflammatory in etiology. Infectious discitis is thought less likely but if there is clinical concern for infection then consider correlation with inflammatory markers and short interval follow-up MRI with contrast to ensure stability. 2. At L3-L4, mild-to-moderate canal stenosis. 3. At L4-L5, right eccentric degenerative change with mild-to-moderate right subarticular recess and foraminal stenosis. 4. At L5-S1, left eccentric degenerative change with mild-to-moderate left foraminal stenosis and mild left subarticular recess narrowing. 5. Partially imaged dilated gallbladder with borderline dilated common bile duct. This could be physiologic; however, consider correlation with liver function enzymes to exclude obstruction. Right upper quadrant ultrasound could also further evaluate if clinically indicated.  Electronically Signed   By: Feliberto Harts M.D.   On: 12/11/2020 12:29   MR LUMBAR SPINE WO CONTRAST  Result Date: 12/11/2020 CLINICAL DATA:  Low back pain, infection suspected; Mid-back pain, neuro deficit EXAM: MRI THORACIC AND LUMBAR SPINE WITHOUT CONTRAST TECHNIQUE: Multiplanar and multiecho pulse sequences of the thoracic and lumbar spine were obtained without intravenous contrast. COMPARISON:  None. FINDINGS: MRI THORACIC SPINE FINDINGS Alignment:  No substantial sagittal subluxation. Vertebrae: Vertebral body heights are maintained. No focal marrow edema to suggest osteomyelitis or acute fracture. Cord:  Normal cord signal. Paraspinal and other soft tissues: Appreciable paraspinal edema. Hiatal hernia. Disc levels: Mild disc bulging at T7-T8 and T8-T9 without significant canal stenosis. Small broad disc bulge with central disc protrusion at T9-T10 with ligamentum flavum thickening. Resulting mild canal stenosis. Multilevel facet arthropathy, greatest in the lower thoracic spine with mild bilateral foraminal stenosis at T8-T9, T9-T10, and T10-T11. Mild foraminal stenosis on the right at T11-T12. T12-L1 levels discussed below. MRI LUMBAR SPINE FINDINGS Segmentation:  Standard. Alignment:  Grade 1 anterolisthesis L4 on L5. Vertebrae: Edema within the T12-L1, L4-L5 and L5-S1 discs. Chronic appearing degenerative endplate signal changes at T12-L1 without significant marrow edema. Conus medullaris and cauda equina: Conus extends to the L1 level. Conus appears normal. Paraspinal and other soft tissues: Partially imaged dilated gallbladder with borderline dilated common bile duct, measuring approximately 6 mm. Left renal cysts. Disc levels: T12-L1: Degenerative disc height loss with disc edema. Posterior disc bulge with moderate bilateral facet hypertrophy. Ligamentum flavum thickening. Resulting moderate right foraminal stenosis and mild canal stenosis. No significant left foraminal stenosis. L1-L2: Left  eccentric disc bulge and mild left greater than right facet hypertrophy. Ligamentum flavum thickening. Resulting mild left foraminal stenosis without significant canal or right foraminal stenosis. L2-L3: Mild disc bulging and mild-to-moderate bilateral facet arthropathy. No significant canal or foraminal stenosis. L3-L4: Broad disc bulge with bulky ligamentum flavum thickening and moderate bilateral facet arthropathy. Resulting mild-to-moderate canal stenosis without significant foraminal stenosis. L4-L5: Right eccentric disc height loss. Grade 1 anterolisthesis of L4 on L5. Uncovering of the disc with superimposed small left subarticular disc protrusion. Moderate right greater than left facet hypertrophy. Resulting mild to moderate right subarticular and foraminal stenosis. No significant canal or left foraminal stenosis. L5-S1: Left eccentric disc height loss. Left eccentric disc bulge and endplate spurring. Moderate left mild right facet arthropathy. Resulting mild-to-moderate left foraminal stenosis with mild left subarticular recess narrowing. No significant canal or right foraminal stenosis. IMPRESSION: MR THORACIC SPINE IMPRESSION 1. Mild canal stenosis at T9-T10. Otherwise, multilevel small disc bulges without significant canal stenosis. 2. Mild multilevel foraminal stenosis in the lower thoracic spine, described above. 3. T12-L1 is discussed below. 4. Hiatal hernia. MR LUMBAR SPINE IMPRESSION 1. At T12-L1, moderate right foraminal stenosis with mild canal stenosis. Degenerative disc disease at this level with disc edema and slight left paraspinal edema, most likely degenerative/inflammatory in etiology. Infectious discitis is thought less likely but if there is clinical concern for infection then consider correlation with inflammatory markers and short interval follow-up MRI with contrast to ensure stability. 2. At L3-L4, mild-to-moderate canal stenosis. 3. At L4-L5, right eccentric degenerative change with  mild-to-moderate right subarticular recess and foraminal stenosis. 4. At L5-S1, left eccentric degenerative change with mild-to-moderate left foraminal stenosis and mild left subarticular recess narrowing. 5. Partially imaged dilated gallbladder with borderline dilated common bile duct. This could be physiologic; however, consider correlation with liver function enzymes to exclude obstruction. Right upper quadrant ultrasound could also further evaluate if clinically indicated. Electronically Signed   By: Feliberto Harts M.D.   On: 12/11/2020 12:29  DG Chest Port 1 View  Result Date: 12/11/2020 CLINICAL DATA:  75 year old female with history of acute respiratory failure with hypoxemia. EXAM: PORTABLE CHEST 1 VIEW COMPARISON:  Chest x-ray 12/09/2020. FINDINGS: Lung volumes are normal. No consolidative airspace disease. Linear opacities of the left lung base, similar to the prior study, favored to reflect subsegmental atelectasis or scarring. Possible small left pleural effusion. No right pleural effusion. No pneumothorax. No evidence of pulmonary edema. Mild cardiomegaly. Aortic atherosclerosis. IMPRESSION: 1. Possible small left pleural effusion with subsegmental atelectasis in the left lower lobe. 2. Cardiomegaly. 3. Aortic atherosclerosis. Electronically Signed   By: Trudie Reed M.D.   On: 12/11/2020 07:52        Scheduled Meds:  atorvastatin  80 mg Oral Daily   buPROPion  150 mg Oral Daily   Chlorhexidine Gluconate Cloth  6 each Topical Daily   clopidogrel  75 mg Oral Daily   hydrOXYzine  50 mg Oral Daily   insulin aspart  0-5 Units Subcutaneous QHS   insulin aspart  0-9 Units Subcutaneous TID WC   isosorbide mononitrate  30 mg Oral Daily   lidocaine  1 patch Transdermal Q24H   midodrine  10 mg Oral TID WC   pantoprazole  40 mg Oral Daily   traZODone  50 mg Oral QHS   Continuous Infusions:  amiodarone 30 mg/hr (12/11/20 0800)    ceFAZolin (ANCEF) IV 2 g (12/11/20 1224)   heparin  1,150 Units/hr (12/11/20 0900)    sodium bicarbonate (isotonic) infusion in sterile water 150 mL/hr at 12/11/20 1218     LOS: 1 day    Time spent: 35 minutes    Tresa Moore, MD [facility] Pager [phone]  If 7PM-7AM, please contact night-coverage 12/11/2020, 1:21 PM

## 2020-12-11 NOTE — Progress Notes (Addendum)
   12/10/20 1822  Assess: MEWS Score  Temp 98.6 F (37 C)  BP (!) 85/59  Pulse Rate (!) 57  Resp 13  Level of Consciousness Alert  SpO2 (!) 88 %  O2 Device Nasal Cannula  O2 Flow Rate (L/min) 2 L/min  Assess: MEWS Score  MEWS Temp 0  MEWS Systolic 1  MEWS Pulse 0  MEWS RR 1  MEWS LOC 0  MEWS Score 2  MEWS Score Color Yellow  Treat  Pain Scale 0-10  Pain Score 10  Pain Type Chronic pain  Pain Location Back  Pain Orientation Lower  Pain Radiating Towards up back, including hips, neck and shoulders  Pain Descriptors / Indicators Sharp  Pain Frequency Constant  Pain Onset On-going  Take Vital Signs  Increase Vital Sign Frequency  Red: Q 1hr X 4 then Q 4hr X 4, if remains red, continue Q 4hrs (Patient's vital signs changed back and forth from red to yellow.  Will increase vital signs to red mews protocol)  Escalate  MEWS: Escalate Red: discuss with charge nurse/RN and provider, consider discussing with RRT  Notify: Charge Nurse/RN  Name of Charge Nurse/RN Notified Jessica C. Rn  Date Charge Nurse/RN Notified 12/11/20  Time Charge Nurse/RN Notified 1830  Notify: Provider  Provider Name/Title Dr. Georgeann Oppenheim  Date Provider Notified 12/10/20  Time Provider Notified [phone]  Notification Type Page (secure chat)  Notification Reason Change in status  Provider response See new orders  Date of Provider Response 12/10/20  Time of Provider Response 1640  Notify: Rapid Response  Date Rapid Response Notified 12/10/20  Time Rapid Response Notified 1845  Document  Patient Outcome Not stable and remains on department  Progress note created (see row info) Yes  Assess: SIRS CRITERIA  SIRS Temperature  0  SIRS Pulse 0  SIRS Respirations  0  SIRS WBC 0  SIRS Score Sum  0

## 2020-12-11 NOTE — Progress Notes (Signed)
Cross Cover 75 year old female with systolic heart failure (EF 35%), coronary artery disease )S/P stents 2007 and 2014,CKD IIIA, chronic back pain, Bell's Palsy, hyperlipidemia, Diabetes Mellitus, GERD, depression/annxiet ywho was admitted to hospital for further evaluation of generalized weakness,  Initial eval showed slightly elevated troponins that were not impressive to be indicative of cardiac dysfunction and Ddimer was elevated.  There were concerns for NSTEMI secondary to initial trop of 105 and ST depression and T wave inversion in inferior and anterior leads.  She was found to have MSSA bacteremia with unidentified source of the bacteria.  She has also had worsening renal function and borderline low blood pressures since admission.  This evening, low blood pressures responsive to IV fluids transiently.  Re-eval chem panel, creatinine up to 5.17, bicarb 18 and nurse reported oliguria. She was started on bicarb continuous infusion and will need consult from neurology. She was transferred to stepdown and critical care consulted secondary to picture of worsening sepsis and MRI of spine to eval for osteromyelitis/discitis is pending

## 2020-12-11 NOTE — Progress Notes (Signed)
Chaplain Maggie visited in response to Order Requisition for Advanced Directive education. Paperwork was left at bedside for pt's consideration. When pt is ready, contact on call chaplain to coordinate notary and witnesses for signature.

## 2020-12-11 NOTE — Progress Notes (Addendum)
PT Cancellation Note  Patient Details Name: Jamie Benitez MRN: [id number] DOB: 1945/05/23   Cancelled Treatment:    Reason Eval/Treat Not Completed: Medical issues which prohibited therapy. Per chart review, patient noted with amiodarone infusion started 9/22 at 18:49. Per guidelines, patient to receive infusion x24 hours hours prior to initiation of exertional activity. Of note pt also pending spine MRI for further assessment. Will hold patient at this time and continue to follow, initiating Physical Therapy evaluation as medically appropriate.  Olga Coaster PT, DPT 9:14 AM,12/11/20

## 2020-12-11 NOTE — Progress Notes (Signed)
*  PRELIMINARY RESULTS* Echocardiogram 2D Echocardiogram has been performed.  Cristela Blue 12/11/2020, 1:28 PM

## 2020-12-11 NOTE — Progress Notes (Signed)
ID Pt is in step down for SOB and Afib Doing better SOB much better No fever No chest pain Patient says back pain is much better.  O/e awake and alert, pale No resp distress BP 106/90 (BP Location: Left Arm)   Pulse 75   Temp 97.8 F (36.6 C) (Oral)   Resp 19   Ht 5' (1.524 m)   Wt 102.1 kg   SpO2 98%   BMI 43.94 kg/m   Chest b/l air entry Crepts bases Hs irregular RVR  Abd soft CNS non focal  Labs CBC Latest Ref Rng & Units 12/10/2020 12/09/2020 03/26/2015  WBC 4.0 - 10.5 K/uL 13.9(H) 11.4(H) 9.2  Hemoglobin 12.0 - 15.0 g/dL 10.7(L) 12.3 11.2(L)  Hematocrit 36.0 - 46.0 % 33.0(L) 37.6 35.0  Platelets 150 - 400 K/uL 150 190 288     CMP Latest Ref Rng & Units 12/11/2020 12/10/2020 12/10/2020  Glucose 70 - 99 mg/dL - 734(L) 937(T)  BUN 8 - 23 mg/dL - 02(I) 09(B)  Creatinine 0.44 - 1.00 mg/dL - 3.53(G) 9.92(E)  Sodium 135 - 145 mmol/L - 130(L) 132(L)  Potassium 3.5 - 5.1 mmol/L - 3.8 3.8  Chloride 98 - 111 mmol/L - 100 98  CO2 22 - 32 mmol/L - 18(L) 18(L)  Calcium 8.9 - 10.3 mg/dL - 7.3(L) 8.0(L)  Total Protein 6.5 - 8.1 g/dL 5.3(L) - -  Total Bilirubin 0.3 - 1.2 mg/dL 0.7 - -  Alkaline Phos 38 - 126 U/L 82 - -  AST 15 - 41 U/L 76(H) - -  ALT 0 - 44 U/L 13 - -     Micro 12/09/20 1 set of blood MSSA bacteremia 12/10/2020 blood cultures 2 sets have been sent  Imaging 2D echo EF 25 to 30%. Moderate left ventricular hypertrophy Akinesis of the left ventricle, entire inferior wall and inferolateral wall.  Mitral valve is normal in structure.  Moderate mitral valve regurgitation.  The aortic valve is normal in structure.  Aortic valve regurgitation is not visualized.  Mild to moderate aortic valve sclerosis/calcification is present without any evidence of aortic stenosis.  No evidence of valvular vegetation on TTE.  Impression/recommendation MSSA bacteremia.  Possible source could be sporadic skin lesions but unclear On cefazolin Repeat blood culture has been sent 2D echo  does not show any vegetation on the valve.  EF is 25 to 30%. MRI of the lumbar spine does not show any infection. Patient will need TEE.  AKI.  Worsening.  Combination of sepsis and hypotension.  A. fib with RVR.  On amiodarone  Demand ischemia  History of CAD with stent.  Patient on Plavix and atorvastatin.  Ischemic cardiomyopathy.  EF is 25 to 30%  Discussed the management with the patient and grandson.  ID will follow her peripherally this weekend.  Call if needed.

## 2020-12-11 NOTE — Consult Note (Signed)
NAME:  Jamie Benitez, MRN:  [id number], DOB:  June 03, 1945, LOS: 1 ADMISSION DATE:  12/09/2020, CONSULTATION DATE:  9/23 REFERRING MD:  Georgeann Oppenheim, CHIEF COMPLAINT:  back pain   History of Present Illness:  75 y/o female with multiple medical problems presented here from home on 9/21 with generalized weakness.  She was living on her own with a lot of support from her daughter but came in because of weakness at home.  She was noted after admission to have staph aureus bacteremia (MSSA) in 2/2 blood culture bottles collected on admission.  Since admission she has developed significant back pain. She has not had any falls or injuries at home prior to admission.  Since admission she has been receiving IV antibiotics (cefazolin), IV fluids, and most recently amiodarone for atrial fibrillation with RVR.  She has been seen by cardiology for an NSTEMI and she has systolic heart failure at baseline.    PCCM was consulted 9/23 in the setting of hypotension, oliguric renal failure, and atrial fibrillation with RVR requiring ICU transfer.   Pertinent  Medical History  Chronic systolic heart failure CKD, IIIa prior to admission Depression GERD Hypertension Hyperlipidemia CAD s/p stents, s/p MI Neuropathy DM2 Temporal arteritis, previously treated with steroids and followed by Dr. Gavin Potters Osteoporosis   Significant Hospital Events: Including procedures, antibiotic start and stop dates in addition to other pertinent events   9/21 admission 9/23 early AM ICU transfer for hypotension, oliguric renal failure, and atrial fibrillation with RVR  Culture: 9/21 blood > 2/2 MSSA 9/21 urine > 9/22 blood x4 >   ABX: 9/21 cefazolin >   Imaging: 9/21 renal u/s > no hydro, other with normal with left kidney cysts 9/21 LE doppler u/s> no DVT 9/22 V/Q > no PE MRI Lumbar/thoracic spine >>> pending   Interim History / Subjective:  As above  Objective   Blood pressure (!) 79/59, pulse 95, temperature  98.3 F (36.8 C), temperature source Oral, resp. rate 17, height 5' (1.524 m), weight 102.1 kg, SpO2 97 %.        Intake/Output Summary (Last 24 hours) at 12/11/2020 0426 Last data filed at 12/10/2020 1514 Gross per 24 hour  Intake 855.49 ml  Output --  Net 855.49 ml   Filed Weights   12/09/20 0354  Weight: 102.1 kg    Examination: General:  Elderly female, frail appearing, in mild distress from back pain in bed HENT: NCAT OP clear PULM: Crackles bases B, tachypnea but no accessory muscle use, speaking in full sentences CV: Irreg irreg, tachy, slight systolic murmur LUSB GI: BS+, soft, nontender MSK: normal bulk and tone Derm: thin skin, multiple bruises over extensor surfaces Neuro: awake, alert, no distress, MAEW  9/23 CXR images personally reviewed> cardiomegaly, left pleural effusion, cephalization, interstitial edema  Resolved Hospital Problem list     Assessment & Plan:  Sepsis due to MSSA bacteremia with worsening end organ damage, currently not in shock: > admit to ICU > tele monitoring > may need vasopressors if MAP < 65 > continue cefazolin > agree with MRI spine to access for epidural abscess > needs echocardiogram > continue gentle IV fluids  Acute on chronic renal failure, oliguric, the patient is not certain she wants hemodialysis > will need nephrology consult in AM > may need palliative care consultation depending on whether or not nephrology feels she needs hemodialysis > continue sodium bicarbonate infusion for now, low threshold to stop if worsening volume overload  Acute on chronic systolic heart  failure History of CAD > close monitoring of respiratory status in ICU setting > needs echo > hold IMDUR if becomes hypotensive again > continue plavix, statin  Atrial fibrillation with RVR: > tele > amiodarone to continue > heparin infusion  DM2 > continue SSI > monitor accu-checks > will stop 70/30 given worsening renal failure  Back  pain: > needs MRI spine > APAP prn  Best Practice (right click and "Reselect all SmartList Selections" daily)   Diet/type: Regular consistency (see orders) DVT prophylaxis: systemic heparin GI prophylaxis: N/A Lines: N/A Foley:  N/A Code Status:  DNR Last date of multidisciplinary goals of care discussion [9/23 Kamilo Och with patient: she is firm in her decision to be a DNR.  When I brought up the idea of hemodialysis her response was "Oh no!"  She really doesn't want to go on hemodialysis because she says that she thinks her family won't take her.  Further, even if it is life or death she thinks that she still doesn't want hemodialysis.]  Labs   CBC: Recent Labs  Lab 12/09/20 0415 12/10/20 0721  WBC 11.4* 13.9*  NEUTROABS 9.1*  --   HGB 12.3 10.7*  HCT 37.6 33.0*  MCV 86.0 84.2  PLT 190 150    Basic Metabolic Panel: Recent Labs  Lab 12/09/20 0415 12/10/20 0721 12/10/20 2318  NA 134* 132* 130*  K 3.4* 3.8 3.8  CL 100 98 100  CO2 22 18* 18*  GLUCOSE 238* 113* 128*  BUN 35* 57* 65*  CREATININE 2.02* 4.21* 5.17*  CALCIUM 8.5* 8.0* 7.3*  MG 1.8  --   --    GFR: Estimated Creatinine Clearance: 10.4 mL/min (A) (by C-G formula based on SCr of 5.17 mg/dL (H)). Recent Labs  Lab 12/09/20 0415 12/09/20 0434 12/10/20 0721  WBC 11.4*  --  13.9*  LATICACIDVEN  --  1.8  --     Liver Function Tests: Recent Labs  Lab 12/09/20 0415  AST 37  ALT 24  ALKPHOS 85  BILITOT 1.2  PROT 6.9  ALBUMIN 3.5   Recent Labs  Lab 12/09/20 0415  LIPASE 35   No results for input(s): AMMONIA in the last 168 hours.  ABG No results found for: PHART, PCO2ART, PO2ART, HCO3, TCO2, ACIDBASEDEF, O2SAT   Coagulation Profile: Recent Labs  Lab 12/09/20 0616  INR 1.1    Cardiac Enzymes: No results for input(s): CKTOTAL, CKMB, CKMBINDEX, TROPONINI in the last 168 hours.  HbA1C: Hgb A1c MFr Bld  Date/Time Value Ref Range Status  12/09/2020 09:20 AM 5.8 (H) 4.8 - 5.6 % Final     Comment:    (NOTE)         Prediabetes: 5.7 - 6.4         Diabetes: >6.4         Glycemic control for adults with diabetes: <7.0     CBG: Recent Labs  Lab 12/09/20 2130 12/10/20 0807 12/10/20 1214 12/10/20 2223 12/11/20 0422  GLUCAP 138* 114* 119* 114* 121*    Review of Systems:   Gen: Denies fever, chills, weight change, fatigue, night sweats HEENT: Denies blurred vision, double vision, hearing loss, tinnitus, sinus congestion, rhinorrhea, sore throat, neck stiffness, dysphagia PULM: has dyspnea, per HPI CV: per HPI GI: Denies abdominal pain, nausea, vomiting, diarrhea, hematochezia, melena, constipation, change in bowel habits GU: Denies dysuria, hematuria, polyuria, oliguria, urethral discharge Endocrine: Denies hot or cold intolerance, polyuria, polyphagia or appetite change Derm: Denies rash, dry skin, scaling or peeling skin  change Heme: Has easy bruising, denies bleeding, bleeding gums Neuro: Denies headache, numbness, weakness, slurred speech, loss of memory or consciousness   Past Medical History:  She,  has a past medical history of Anxiety, Bell's palsy (08/2018), CHF (congestive heart failure) (HCC), Chronic back pain, Chronic cough, CKD (chronic kidney disease), stage III (HCC), Depression, Diabetes mellitus, type 2 (HCC), GERD (gastroesophageal reflux disease), Hyperlipidemia, Hypertension, Multilevel degenerative disc disease, Myocardial infarction (HCC) (2007), Neuropathy, and Osteoporosis.   Surgical History:   Past Surgical History:  Procedure Laterality Date   ABDOMINAL HYSTERECTOMY     BACK SURGERY     CARDIAC CATHETERIZATION  2007   & 2014.  stents   CATARACT EXTRACTION W/PHACO Right 09/16/2019   Procedure: CATARACT EXTRACTION PHACO AND INTRAOCULAR LENS PLACEMENT (IOC) RIGHT DIABETIC 2.12  00:27.3;  Surgeon: Nevada Crane, MD;  Location[facility];  Service: Ophthalmology;  Laterality: Right;  Diabetic - insulin and oral meds   ROTATOR  CUFF REPAIR     x2     Social History:   reports that she quit smoking about 8 years ago. Her smoking use included cigarettes. She has never used smokeless tobacco. She reports that she does not currently use alcohol. She reports that she does not use drugs.   Family History:  Her family history includes Diabetes type II in her daughter and son.   Allergies Allergies  Allergen Reactions   Sulfa Antibiotics Rash    Mouth blisters Other reaction(s): Angioedema "whole mouth swells"   Gabapentin Diarrhea     Home Medications  Prior to Admission medications   Medication Sig Start Date End Date Taking? Authorizing Provider  atorvastatin (LIPITOR) 80 MG tablet Take 80 mg by mouth daily.   Yes [provider]  buPROPion (WELLBUTRIN XL) 150 MG 24 hr tablet Take 150 mg by mouth daily. 11/02/20  Yes [provider]  clopidogrel (PLAVIX) 75 MG tablet Take 75 mg by mouth daily.   Yes [provider]  glipiZIDE (GLUCOTROL XL) 2.5 MG 24 hr tablet Take 2.5 mg by mouth daily with breakfast.   Yes [provider]  HUMULIN 70/30 KWIKPEN (70-30) 100 UNIT/ML KwikPen Inject 10 Units into the skin 2 (two) times daily before a meal. 11/03/20  Yes [provider]  hydrOXYzine (ATARAX/VISTARIL) 50 MG tablet Take 50 mg by mouth daily.   Yes [provider]  isosorbide mononitrate (IMDUR) 30 MG 24 hr tablet Take 30 mg by mouth daily.   Yes [provider]  lisinopril (ZESTRIL) 5 MG tablet Take 10 mg by mouth daily.   Yes [provider]  meloxicam (MOBIC) 15 MG tablet Take 15 mg by mouth daily.   Yes [provider]  mupirocin ointment (BACTROBAN) 2 % Place 1 application into the nose 2 (two) times daily as needed.   Yes [provider]  omeprazole (PRILOSEC) 40 MG capsule Take 40 mg by mouth daily.   Yes [provider]  traMADol (ULTRAM) 50 MG tablet Take 50 mg by mouth daily.   Yes [provider]   traZODone (DESYREL) 50 MG tablet Take 50 mg by mouth at bedtime.   Yes [provider]     Critical care time: 45 minutes    Heber Pascola, MD [place] [facility] Pager: [phone] Cell: [phone] After 7:00 pm call Elink  [phone]

## 2020-12-11 NOTE — Progress Notes (Signed)
[facility] Cardiology    SUBJECTIVE: Patient resting comfortably slightly lethargic arousable denies any pain denies shortness of breath   Vitals:   12/11/20 0515 12/11/20 0530 12/11/20 0545 12/11/20 0600  BP: 106/82 (!) 106/57 108/67 94/70  Pulse: 88 87 80   Resp: 18 17 16 16   Temp:      TempSrc:      SpO2: 98% 98% 100%   Weight:      Height:         Intake/Output Summary (Last 24 hours) at 12/11/2020 12/13/2020 Last data filed at 12/11/2020 0500 Gross per 24 hour  Intake 1780.97 ml  Output 0 ml  Net 1780.97 ml      PHYSICAL EXAM  General: Well developed, well nourished, in no acute distress HEENT:  Normocephalic and atramatic Neck:  No JVD.  Lungs: Clear bilaterally to auscultation and percussion. Heart: Irregular irregular. Normal S1 and S2 without gallops or murmurs.  Abdomen: Bowel sounds are positive, abdomen soft and non-tender  Msk:  Back normal, normal gait. Normal strength and tone for age. Extremities: No clubbing, cyanosis or edema.   Neuro: Alert and oriented X 3. Psych:  Good affect, responds appropriately   LABS: Basic Metabolic Panel: Recent Labs    12/09/20 0415 12/10/20 0721 12/10/20 2318  NA 134* 132* 130*  K 3.4* 3.8 3.8  CL 100 98 100  CO2 22 18* 18*  GLUCOSE 238* 113* 128*  BUN 35* 57* 65*  CREATININE 2.02* 4.21* 5.17*  CALCIUM 8.5* 8.0* 7.3*  MG 1.8  --   --    Liver Function Tests: Recent Labs    12/09/20 0415  AST 37  ALT 24  ALKPHOS 85  BILITOT 1.2  PROT 6.9  ALBUMIN 3.5   Recent Labs    12/09/20 0415  LIPASE 35   CBC: Recent Labs    12/09/20 0415 12/10/20 0721  WBC 11.4* 13.9*  NEUTROABS 9.1*  --   HGB 12.3 10.7*  HCT 37.6 33.0*  MCV 86.0 84.2  PLT 190 150   Cardiac Enzymes: No results for input(s): CKTOTAL, CKMB, CKMBINDEX, TROPONINI in the last 72 hours. BNP: Invalid input(s): POCBNP D-Dimer: Recent Labs    12/09/20 0418  DDIMER 3.09*   Hemoglobin A1C: Recent Labs    12/09/20 0920  HGBA1C 5.8*    Fasting Lipid Panel: Recent Labs    12/10/20 0721  CHOL 94  HDL 33*  LDLCALC 37  TRIG 12/12/20  CHOLHDL 2.8   Thyroid Function Tests: No results for input(s): TSH, T4TOTAL, T3FREE, THYROIDAB in the last 72 hours.  Invalid input(s): FREET3 Anemia Panel: No results for input(s): VITAMINB12, FOLATE, FERRITIN, TIBC, IRON, RETICCTPCT in the last 72 hours.  NM Pulmonary Perfusion  Result Date: 12/09/2020 CLINICAL DATA:  Pulmonary embolism suspected. Low intermediate probability. Elevated D-dimer levels. Negative lower extremity Doppler ultrasound for DVT. EXAM: NUCLEAR MEDICINE PERFUSION LUNG SCAN TECHNIQUE: Perfusion images were obtained in multiple projections after intravenous injection of radiopharmaceutical. Ventilation scans intentionally deferred if perfusion scan and chest x-ray adequate for interpretation during COVID 19 epidemic. RADIOPHARMACEUTICALS:  4.52 mCi Tc-37m MAA IV COMPARISON:  Portable chest 12/09/2020. Lower extremity Doppler ultrasound 12/09/2020. FINDINGS: There are no segmental perfusion defects to suggest pulmonary embolism. The heart is enlarged. There is mildly decreased perfusion to the left lung base, corresponding with a probable small left pleural effusion or pleural thickening on radiography. IMPRESSION: No evidence of pulmonary embolism. Electronically Signed   By: 12/11/2020 M.D.   On: 12/09/2020 12:48  US RENAL  Result Date: 12/09/2020 CLINICAL DATA:  Acute kidney injury EXAM: RENAL / URINARY TRACT ULTRASOUND COMPLETE COMPARISON:  None. FINDINGS: Right Kidney: Renal measurements: 10.4 x 5.0 x 5.4 cm = volume: 147.18 mL. Echogenicity within normal limits. No mass or hydronephrosis visualized. Left Kidney: Renal measurements: 11.5 x 5.3 x 3.9 cm = volume: 123.9 mL. Upper pole cyst measures 1.8 x 1.2 x 1.3 cm. Inferior pole cyst measures 1.5 x 1.1 x 1.3 cm Echogenicity within normal limits. No mass or hydronephrosis. Bladder: Appears normal for degree of bladder  distention. Other: None. IMPRESSION: 1. Echogenicity within normal limits. No mass or hydronephrosis visualized. 2. Left kidney cysts. Electronically Signed   By: Signa Kell M.D.   On: 12/09/2020 09:31   US Venous Img Lower Bilateral (DVT)  Result Date: 12/09/2020 CLINICAL DATA:  Elevated D-dimer. Shortness of breath. Evaluate for DVT. EXAM: BILATERAL LOWER EXTREMITY VENOUS DOPPLER ULTRASOUND TECHNIQUE: Gray-scale sonography with graded compression, as well as color Doppler and duplex ultrasound were performed to evaluate the lower extremity deep venous systems from the level of the common femoral vein and including the common femoral, femoral, profunda femoral, popliteal and calf veins including the posterior tibial, peroneal and gastrocnemius veins when visible. The superficial great saphenous vein was also interrogated. Spectral Doppler was utilized to evaluate flow at rest and with distal augmentation maneuvers in the common femoral, femoral and popliteal veins. COMPARISON:  None. FINDINGS: RIGHT LOWER EXTREMITY Common Femoral Vein: No evidence of thrombus. Normal compressibility, respiratory phasicity and response to augmentation. Saphenofemoral Junction: No evidence of thrombus. Normal compressibility and flow on color Doppler imaging. Profunda Femoral Vein: No evidence of thrombus. Normal compressibility and flow on color Doppler imaging. Femoral Vein: No evidence of thrombus. Normal compressibility, respiratory phasicity and response to augmentation. Popliteal Vein: No evidence of thrombus. Normal compressibility, respiratory phasicity and response to augmentation. Calf Veins: No evidence of thrombus. Normal compressibility and flow on color Doppler imaging. Superficial Great Saphenous Vein: No evidence of thrombus. Normal compressibility. Venous Reflux:  None. Other Findings:  None. LEFT LOWER EXTREMITY Common Femoral Vein: No evidence of thrombus. Normal compressibility, respiratory phasicity and  response to augmentation. Saphenofemoral Junction: No evidence of thrombus. Normal compressibility and flow on color Doppler imaging. Profunda Femoral Vein: No evidence of thrombus. Normal compressibility and flow on color Doppler imaging. Femoral Vein: No evidence of thrombus. Normal compressibility, respiratory phasicity and response to augmentation. Popliteal Vein: No evidence of thrombus. Normal compressibility, respiratory phasicity and response to augmentation. Calf Veins: No evidence of thrombus. Normal compressibility and flow on color Doppler imaging. Superficial Great Saphenous Vein: No evidence of thrombus. Normal compressibility. Venous Reflux:  None. Other Findings: There is an approximately 3.0 x 1.5 x 1.4 cm fluid collection with left popliteal fossa compatible with a Baker's cyst. IMPRESSION: 1. No evidence of DVT within either lower extremity. 2. Note made of an approximately 3.0 cm left-sided Baker's cyst. Electronically Signed   By: Simonne Come M.D.   On: 12/09/2020 11:25     Echo pending  TELEMETRY: Atrial fibrillation rate of 90:  ASSESSMENT AND PLAN:  Principal Problem:   NSTEMI (non-ST elevated myocardial infarction) (HCC) Active Problems:   Chronic systolic CHF (congestive heart failure) (HCC)   Chronic back pain   Acute renal failure superimposed on stage 3a chronic kidney disease (HCC)   Depression with anxiety   Type II diabetes mellitus with renal manifestations (HCC)   CAD (coronary artery disease)   HLD (hyperlipidemia)   HTN (  hypertension)   Hypokalemia   Leukocytosis   Acute decompensated heart failure (HCC)    Plan Patient had borderline troponins this does not represent a non-STEMI in my opinion Chronic systolic congestive heart failure reasonably compensated Sepsis with hypotension recommend continue broad-spectrum antibiotic therapy Atrial fibrillation rate around 90 nonspecific ST-T wave changes continue amiodarone therapy until patient able to take  p.o. then will transition to 200 twice a day Demand ischemia with borderline troponin Coronary disease status post PCI and stent in the past Ischemic cardiomyopathy Possible MSSA bacteremia continue broad-spectrum antibiotic therapy appreciate infectious disease input Conservative cardiac management from heart failure standpoint Support hypotension add midodrine if necessary    Alwyn Pea, MD 12/11/2020 7:02 AM

## 2020-12-11 NOTE — Progress Notes (Deleted)
   12/10/20 1822  Assess: MEWS Score  Temp 98.6 F (37 C)  BP (!) 85/59  Pulse Rate (!) 57  Resp 13  Level of Consciousness Alert  SpO2 (!) 88 %  O2 Device Nasal Cannula  O2 Flow Rate (L/min) 2 L/min  Assess: MEWS Score  MEWS Temp 0  MEWS Systolic 1  MEWS Pulse 0  MEWS RR 1  MEWS LOC 0  MEWS Score 2  MEWS Score Color Yellow  Treat  Pain Scale 0-10  Pain Score 10  Pain Type Chronic pain  Pain Location Back  Pain Orientation Lower  Pain Radiating Towards up back, including hips, neck and shoulders  Pain Descriptors / Indicators Sharp  Pain Frequency Constant  Pain Onset On-going  Take Vital Signs  Increase Vital Sign Frequency  Red: Q 1hr X 4 then Q 4hr X 4, if remains red, continue Q 4hrs (Patient's vital signs changed forth from red to yellow.  Will increase vital signs to red mews protocol)  Escalate  MEWS: Escalate Red: discuss with charge nurse/RN and provider, consider discussing with RRT  Notify: Charge Nurse/RN  Name of Charge Nurse/RN Notified Jessica C. Rn  Date Charge Nurse/RN Notified 12/11/20  Time Charge Nurse/RN Notified 1830  Notify: Provider  Provider Name/Title Dr. Georgeann Oppenheim  Date Provider Notified 12/10/20  Time Provider Notified [phone]  Notification Type Page (secure chat)  Notification Reason Change in status  Provider response See new orders  Date of Provider Response 12/10/20  Time of Provider Response 1640  Notify: Rapid Response  Date Rapid Response Notified 12/10/20  Time Rapid Response Notified 1845  Assess: SIRS CRITERIA  SIRS Temperature  0  SIRS Pulse 0  SIRS Respirations  0  SIRS WBC 0  SIRS Score Sum  0

## 2020-12-11 NOTE — Consult Note (Signed)
[facility] Consult Note:12/11/2020    Date of Admission:  12/09/2020           Reason for Consult:     Referring Provider: Tresa Moore, MD Primary Care Provider: Kandyce Rud, MD   History of Presenting Illness:  Jamie Benitez is a 75 y.o. female  with multiple medical problems as noted below Patient was originally admitted for generalized weakness which was ongoing for days prior to admission.  She was noted to have staphylococcal aureus bacteremia (MSSA).  Her main complaint is back pain.  Denies any falls or injuries.  Patient has been evaluated by cardiology for non-STEMI and systolic CHF.  She is currently being monitored in the ICU for hypotension, atrial fibrillation with RVR, renal failure with decreased urine output. Baseline creatinine appears to be 1.2 from October 28, 2019 Admission creatinine of 2.02/GFR 26 from December 09, 2020 Urinalysis on admission shows specific gravity 1.027, protein 100 mg/dL, 0-5 RBCs, 0-5 WBCs. Renal imaging in the form of renal ultrasound September 21, shows echogenicity within normal limits.  No mass or hydronephrosis.  Patient has left small kidney cysts. Review of chart suggest patient has had multiple episodes of hypotension.  Systolic blood pressure has ranged from 164-60 4/40 systolic on evening of September 22 Patient creatinine has worsened to 5.17.  Urine output appears to be very poor.   Review of Systems: Review of Systems  Constitutional: Negative.   HENT: Negative.    Eyes: Negative.   Respiratory: Negative.    Cardiovascular:  Negative for chest pain, palpitations, orthopnea, claudication, leg swelling and PND.  Gastrointestinal: Negative.   Genitourinary: Negative.   Musculoskeletal:  Positive for back pain.  Skin: Negative.   Neurological: Negative.   Endo/Heme/Allergies: Negative.   Psychiatric/Behavioral: Negative.     Past Medical History:  Diagnosis Date   Anxiety    Bell's palsy  08/2018   CHF (congestive heart failure) (HCC)    Chronic back pain    Chronic cough    CKD (chronic kidney disease), stage III (HCC)    Depression    Diabetes mellitus, type 2 (HCC)    GERD (gastroesophageal reflux disease)    Hyperlipidemia    Hypertension    Multilevel degenerative disc disease    Myocardial infarction [facility]) 2007   & 2014   Neuropathy    Osteoporosis     Social History   Tobacco Use   Smoking status: Former    Types: Cigarettes    Quit date: 2014    Years since quitting: 8.7   Smokeless tobacco: Never  Vaping Use   Vaping Use: Never used  Substance Use Topics   Alcohol use: Not Currently   Drug use: Never    Family History  Problem Relation Age of Onset   Diabetes type II Daughter    Diabetes type II Son      OBJECTIVE: Blood pressure 106/63, pulse (!) 102, temperature 97.6 F (36.4 C), temperature source Oral, resp. rate (!) 23, height 5' (1.524 m), weight 102.1 kg, SpO2 97 %.  Physical Exam  General appearance: Ill-appearing, laying in the bed HEENT: Dry oral mucous membranes Neck: Supple, no JVD Cardiovascular: Irregular rhythm, no rub Pulmonary: Normal respiratory effort, [place] O2 Abdomen: Soft, obese, nontender Extremities: No peripheral edema Skin: No acute rashes Neuro: Lethargic but arousable and able to answer simple questions  Lab Results Lab Results  Component Value Date   WBC 13.9 (H) 12/10/2020   HGB 10.7 (L)  12/10/2020   HCT 33.0 (L) 12/10/2020   MCV 84.2 12/10/2020   PLT 150 12/10/2020    Lab Results  Component Value Date   CREATININE 5.17 (H) 12/10/2020   BUN 65 (H) 12/10/2020   NA 130 (L) 12/10/2020   K 3.8 12/10/2020   CL 100 12/10/2020   CO2 18 (L) 12/10/2020    Lab Results  Component Value Date   ALT 24 12/09/2020   AST 37 12/09/2020   ALKPHOS 85 12/09/2020   BILITOT 1.2 12/09/2020     Microbiology: Recent Results (from the past 240 hour(s))  Resp Panel by RT-PCR (Flu A&B, Covid) Nasopharyngeal  Swab     Status: None   Collection Time: 12/09/20  4:34 AM   Specimen: Nasopharyngeal Swab; Nasopharyngeal(NP) swabs in vial transport medium  Result Value Ref Range Status   SARS Coronavirus 2 by RT PCR NEGATIVE NEGATIVE Final    Comment: (NOTE) SARS-CoV-2 target nucleic acids are NOT DETECTED.  The SARS-CoV-2 RNA is generally detectable in upper respiratory specimens during the acute phase of infection. The lowest concentration of SARS-CoV-2 viral copies this assay can detect is 138 copies/mL. A negative result does not preclude SARS-Cov-2 infection and should not be used as the sole basis for treatment or other patient management decisions. A negative result may occur with  improper specimen collection/handling, submission of specimen other than nasopharyngeal swab, presence of viral mutation(s) within the areas targeted by this assay, and inadequate number of viral copies(<138 copies/mL). A negative result must be combined with clinical observations, patient history, and epidemiological information. The expected result is Negative.  Fact Sheet for Patients:  BloggerCourse.com  Fact Sheet for Healthcare Providers:  SeriousBroker.it  This test is no t yet approved or cleared by the [place] FDA and  has been authorized for detection and/or diagnosis of SARS-CoV-2 by FDA under an Emergency Use Authorization (EUA). This EUA will remain  in effect (meaning this test can be used) for the duration of the COVID-19 declaration under Section 564(b)(1) of the Act, 21 U.S.C.section 360bbb-3(b)(1), unless the authorization is terminated  or revoked sooner.       Influenza A by PCR NEGATIVE NEGATIVE Final   Influenza B by PCR NEGATIVE NEGATIVE Final    Comment: (NOTE) The Xpert Xpress SARS-CoV-2/FLU/RSV plus assay is intended as an aid in the diagnosis of influenza from Nasopharyngeal swab specimens and should not be used as a sole  basis for treatment. Nasal washings and aspirates are unacceptable for Xpert Xpress SARS-CoV-2/FLU/RSV testing.  Fact Sheet for Patients: BloggerCourse.com  Fact Sheet for Healthcare Providers: SeriousBroker.it  This test is not yet approved or cleared by the [place] FDA and has been authorized for detection and/or diagnosis of SARS-CoV-2 by FDA under an Emergency Use Authorization (EUA). This EUA will remain in effect (meaning this test can be used) for the duration of the COVID-19 declaration under Section 564(b)(1) of the Act, 21 U.S.C. section 360bbb-3(b)(1), unless the authorization is terminated or revoked.  Performed at [facility], [address] [place], [place] [address]   Blood culture (routine single)     Status: Abnormal   Collection Time: 12/09/20  4:34 AM   Specimen: BLOOD  Result Value Ref Range Status   Specimen Description   Final    BLOOD LEFT ARM Performed at [facility], [address] [place], [place] [address]    Special Requests   Final    BOTTLES DRAWN AEROBIC AND ANAEROBIC Blood Culture adequate  volume Performed at [facility], [address] [place], [place] [address]    Culture  Setup Time   Final    GRAM POSITIVE COCCI IN BOTH AEROBIC AND ANAEROBIC BOTTLES CRITICAL RESULT CALLED TO, READ BACK BY AND VERIFIED WITH: Lelon Mast RAEUR AT 1750 ON 12/09/20 BY SS Performed at [facility], [address] [place], [place] [address]    Culture STAPHYLOCOCCUS AUREUS (A)  Final   Report Status 12/11/2020 FINAL  Final   Organism ID, Bacteria STAPHYLOCOCCUS AUREUS  Final      Susceptibility   Staphylococcus aureus - MIC*    CIPROFLOXACIN 1 SENSITIVE Sensitive     ERYTHROMYCIN <=0.25 SENSITIVE Sensitive     GENTAMICIN <=0.5 SENSITIVE Sensitive     OXACILLIN 0.5 SENSITIVE Sensitive     TETRACYCLINE <=1 SENSITIVE Sensitive     VANCOMYCIN <=0.5 SENSITIVE  Sensitive     TRIMETH/SULFA <=10 SENSITIVE Sensitive     CLINDAMYCIN <=0.25 SENSITIVE Sensitive     RIFAMPIN <=0.5 SENSITIVE Sensitive     Inducible Clindamycin NEGATIVE Sensitive     * STAPHYLOCOCCUS AUREUS  Blood Culture ID Panel (Reflexed)     Status: Abnormal   Collection Time: 12/09/20  4:34 AM  Result Value Ref Range Status   Enterococcus faecalis NOT DETECTED NOT DETECTED Final   Enterococcus Faecium NOT DETECTED NOT DETECTED Final   Listeria monocytogenes NOT DETECTED NOT DETECTED Final   Staphylococcus species DETECTED (A) NOT DETECTED Final    Comment: CRITICAL RESULT CALLED TO, READ BACK BY AND VERIFIED WITH: SAMANTHA RAEUR AT 1750 ON 12/09/20 BY SS    Staphylococcus aureus (BCID) DETECTED (A) NOT DETECTED Final    Comment: CRITICAL RESULT CALLED TO, READ BACK BY AND VERIFIED WITH: SAMANTHA RAEUR AT 1750 ON 12/09/20 BY SS    Staphylococcus epidermidis NOT DETECTED NOT DETECTED Final   Staphylococcus lugdunensis NOT DETECTED NOT DETECTED Final   Streptococcus species NOT DETECTED NOT DETECTED Final   Streptococcus agalactiae NOT DETECTED NOT DETECTED Final   Streptococcus pneumoniae NOT DETECTED NOT DETECTED Final   Streptococcus pyogenes NOT DETECTED NOT DETECTED Final   A.calcoaceticus-baumannii NOT DETECTED NOT DETECTED Final   Bacteroides fragilis NOT DETECTED NOT DETECTED Final   Enterobacterales NOT DETECTED NOT DETECTED Final   Enterobacter cloacae complex NOT DETECTED NOT DETECTED Final   Escherichia coli NOT DETECTED NOT DETECTED Final   Klebsiella aerogenes NOT DETECTED NOT DETECTED Final   Klebsiella oxytoca NOT DETECTED NOT DETECTED Final   Klebsiella pneumoniae NOT DETECTED NOT DETECTED Final   Proteus species NOT DETECTED NOT DETECTED Final   Salmonella species NOT DETECTED NOT DETECTED Final   Serratia marcescens NOT DETECTED NOT DETECTED Final   Haemophilus influenzae NOT DETECTED NOT DETECTED Final   Neisseria meningitidis NOT DETECTED NOT DETECTED Final    Pseudomonas aeruginosa NOT DETECTED NOT DETECTED Final   Stenotrophomonas maltophilia NOT DETECTED NOT DETECTED Final   Candida albicans NOT DETECTED NOT DETECTED Final   Candida auris NOT DETECTED NOT DETECTED Final   Candida glabrata NOT DETECTED NOT DETECTED Final   Candida krusei NOT DETECTED NOT DETECTED Final   Candida parapsilosis NOT DETECTED NOT DETECTED Final   Candida tropicalis NOT DETECTED NOT DETECTED Final   Cryptococcus neoformans/gattii NOT DETECTED NOT DETECTED Final   Meth resistant mecA/C and MREJ NOT DETECTED NOT DETECTED Final    Comment: Performed at [facility], [address] [place], [place] [address]  Urine Culture     Status: Abnormal  Collection Time: 12/09/20  9:24 PM   Specimen: In/Out Cath Urine  Result Value Ref Range Status   Specimen Description   Final    IN/OUT CATH URINE Performed at [facility], [address] [place], [place] [address]    Special Requests   Final    NONE Performed at [facility], [address] [place], [place] [address]    Culture MULTIPLE SPECIES PRESENT, SUGGEST RECOLLECTION (A)  Final   Report Status 12/11/2020 FINAL  Final  CULTURE, BLOOD (ROUTINE X 2) w Reflex to ID Panel     Status: None (Preliminary result)   Collection Time: 12/10/20  4:29 PM   Specimen: BLOOD  Result Value Ref Range Status   Specimen Description BLOOD LEFT ANTECUBITAL  Final   Special Requests   Final    BOTTLES DRAWN AEROBIC ONLY Blood Culture adequate volume   Culture   Final    NO GROWTH < 24 HOURS Performed at [facility], [address] [place], [place] [address]    Report Status PENDING  Incomplete  CULTURE, BLOOD (ROUTINE X 2) w Reflex to ID Panel     Status: None (Preliminary result)   Collection Time: 12/10/20  5:19 PM   Specimen: BLOOD  Result Value Ref Range Status   Specimen Description BLOOD BLOOD LEFT FOREARM  Final   Special Requests   Final    BOTTLES DRAWN AEROBIC AND  ANAEROBIC Blood Culture adequate volume   Culture   Final    NO GROWTH < 24 HOURS Performed at [facility], [address] [place], [place] [address]    Report Status PENDING  Incomplete  MRSA Next Gen by PCR, Nasal     Status: None   Collection Time: 12/11/20  4:29 AM   Specimen: Nasal Mucosa; Nasal Swab  Result Value Ref Range Status   MRSA by PCR Next Gen NOT DETECTED NOT DETECTED Final    Comment: (NOTE) The GeneXpert MRSA Assay (FDA approved for NASAL specimens only), is one component of a comprehensive MRSA colonization surveillance program. It is not intended to diagnose MRSA infection nor to guide or monitor treatment for MRSA infections. Test performance is not FDA approved in patients less than 66 years old. Performed at [facility], [address] [place], [place] [address]     Medications: Scheduled Meds:  atorvastatin  80 mg Oral Daily   buPROPion  150 mg Oral Daily   Chlorhexidine Gluconate Cloth  6 each Topical Daily   clopidogrel  75 mg Oral Daily   heparin  2,000 Units Intravenous Once   hydrOXYzine  50 mg Oral Daily   insulin aspart  0-5 Units Subcutaneous QHS   insulin aspart  0-9 Units Subcutaneous TID WC   isosorbide mononitrate  30 mg Oral Daily   lidocaine  1 patch Transdermal Q24H   midodrine  10 mg Oral TID WC   pantoprazole  40 mg Oral Daily   traZODone  50 mg Oral QHS   Continuous Infusions:  amiodarone 30 mg/hr (12/11/20 0800)    ceFAZolin (ANCEF) IV Stopped (12/10/20 2300)   heparin 1,150 Units/hr (12/11/20 0900)    sodium bicarbonate (isotonic) infusion in sterile water 150 mL/hr at 12/11/20 0335   PRN Meds:.acetaminophen, albuterol, methocarbamol, metoprolol tartrate, mupirocin ointment  Allergies  Allergen Reactions   Sulfa Antibiotics Rash    Mouth blisters Other reaction(s): Angioedema "whole mouth swells"   Gabapentin Diarrhea    Urinalysis: Recent Labs  12/09/20 2124  COLORURINE AMBER*  LABSPEC  1.027  PHURINE 5.0  GLUCOSEU NEGATIVE  HGBUR SMALL*  BILIRUBINUR NEGATIVE  KETONESUR NEGATIVE  PROTEINUR 100*  NITRITE NEGATIVE  LEUKOCYTESUR NEGATIVE      Imaging: NM Pulmonary Perfusion  Result Date: 12/09/2020 CLINICAL DATA:  Pulmonary embolism suspected. Low intermediate probability. Elevated D-dimer levels. Negative lower extremity Doppler ultrasound for DVT. EXAM: NUCLEAR MEDICINE PERFUSION LUNG SCAN TECHNIQUE: Perfusion images were obtained in multiple projections after intravenous injection of radiopharmaceutical. Ventilation scans intentionally deferred if perfusion scan and chest x-ray adequate for interpretation during COVID 19 epidemic. RADIOPHARMACEUTICALS:  4.52 mCi Tc-69m MAA IV COMPARISON:  Portable chest 12/09/2020. Lower extremity Doppler ultrasound 12/09/2020. FINDINGS: There are no segmental perfusion defects to suggest pulmonary embolism. The heart is enlarged. There is mildly decreased perfusion to the left lung base, corresponding with a probable small left pleural effusion or pleural thickening on radiography. IMPRESSION: No evidence of pulmonary embolism. Electronically Signed   By: Carey Bullocks M.D.   On: 12/09/2020 12:48   US Venous Img Lower Bilateral (DVT)  Result Date: 12/09/2020 CLINICAL DATA:  Elevated D-dimer. Shortness of breath. Evaluate for DVT. EXAM: BILATERAL LOWER EXTREMITY VENOUS DOPPLER ULTRASOUND TECHNIQUE: Gray-scale sonography with graded compression, as well as color Doppler and duplex ultrasound were performed to evaluate the lower extremity deep venous systems from the level of the common femoral vein and including the common femoral, femoral, profunda femoral, popliteal and calf veins including the posterior tibial, peroneal and gastrocnemius veins when visible. The superficial great saphenous vein was also interrogated. Spectral Doppler was utilized to evaluate flow at rest and with distal augmentation maneuvers in the common femoral, femoral and  popliteal veins. COMPARISON:  None. FINDINGS: RIGHT LOWER EXTREMITY Common Femoral Vein: No evidence of thrombus. Normal compressibility, respiratory phasicity and response to augmentation. Saphenofemoral Junction: No evidence of thrombus. Normal compressibility and flow on color Doppler imaging. Profunda Femoral Vein: No evidence of thrombus. Normal compressibility and flow on color Doppler imaging. Femoral Vein: No evidence of thrombus. Normal compressibility, respiratory phasicity and response to augmentation. Popliteal Vein: No evidence of thrombus. Normal compressibility, respiratory phasicity and response to augmentation. Calf Veins: No evidence of thrombus. Normal compressibility and flow on color Doppler imaging. Superficial Great Saphenous Vein: No evidence of thrombus. Normal compressibility. Venous Reflux:  None. Other Findings:  None. LEFT LOWER EXTREMITY Common Femoral Vein: No evidence of thrombus. Normal compressibility, respiratory phasicity and response to augmentation. Saphenofemoral Junction: No evidence of thrombus. Normal compressibility and flow on color Doppler imaging. Profunda Femoral Vein: No evidence of thrombus. Normal compressibility and flow on color Doppler imaging. Femoral Vein: No evidence of thrombus. Normal compressibility, respiratory phasicity and response to augmentation. Popliteal Vein: No evidence of thrombus. Normal compressibility, respiratory phasicity and response to augmentation. Calf Veins: No evidence of thrombus. Normal compressibility and flow on color Doppler imaging. Superficial Great Saphenous Vein: No evidence of thrombus. Normal compressibility. Venous Reflux:  None. Other Findings: There is an approximately 3.0 x 1.5 x 1.4 cm fluid collection with left popliteal fossa compatible with a Baker's cyst. IMPRESSION: 1. No evidence of DVT within either lower extremity. 2. Note made of an approximately 3.0 cm left-sided Baker's cyst. Electronically Signed   By: Simonne Come M.D.   On: 12/09/2020 11:25   DG Chest Port 1 View  Result Date: 12/11/2020 CLINICAL DATA:  75 year old female with history of acute respiratory failure with hypoxemia. EXAM: PORTABLE CHEST 1 VIEW COMPARISON:  Chest x-ray 12/09/2020. FINDINGS: Lung  volumes are normal. No consolidative airspace disease. Linear opacities of the left lung base, similar to the prior study, favored to reflect subsegmental atelectasis or scarring. Possible small left pleural effusion. No right pleural effusion. No pneumothorax. No evidence of pulmonary edema. Mild cardiomegaly. Aortic atherosclerosis. IMPRESSION: 1. Possible small left pleural effusion with subsegmental atelectasis in the left lower lobe. 2. Cardiomegaly. 3. Aortic atherosclerosis. Electronically Signed   By: Trudie Reed M.D.   On: 12/11/2020 07:52      Assessment/Plan:  POPPI SCANTLING is a 75 y.o. female with medical problems of   Diabetes, CHF, chronic back pain, depression, hypertension, GERD, degenerative disc disease, anxiety   was admitted on 12/09/2020 for :  Elevated troponin level [R77.8] Positive D-dimer [R79.89] Demand ischemia (HCC) [I24.8] NSTEMI (non-ST elevated myocardial infarction) (HCC) [I21.4] Generalized weakness [R53.1] AKI (acute kidney injury) (HCC) [N17.9] Acute decompensated heart failure (HCC) [I50.9] Acute renal failure, unspecified acute renal failure type (HCC) [N17.9]  #Acute kidney injury on chronic kidney disease stage IIIb Baseline creatinine 1.2/GFR 44 from October 28, 2019 Urinalysis as noted above-0-5 RBCs and WBCs.  Some proteinuria noted Renal imaging is unremarkable Given episodes of hypotension since admission, AKI seems to be secondary to severe ATN Patient now has oliguric renal failure with worsening BUN and creatinine Electrolytes and volume status are acceptable at present.  No acute indication for dialysis but may need this admission if renal parameters do not improve. It is imperative to  maintain hemodynamic stability and avoid hypotension -Currently receiving IV bicarbonate infusion for metabolic acidosis Monitor volume status closely  #Proteinuria Will obtain urine protein to creatinine ratio  #Diabetes type 2 with CKD  Lab Results  Component Value Date   HGBA1C 5.8 (H) 12/09/2020     Jamie Benitez 12/11/20

## 2020-12-11 NOTE — Progress Notes (Signed)
MRI of back ordered by doc. MRI called RN stating there was no transport and RN would have to bring her down. RN asked doc how emergent was MRI as transport would return at 7a. Per MD ok to hold MRI until transport returns.

## 2020-12-11 NOTE — Progress Notes (Signed)
ANTICOAGULATION CONSULT NOTE  Pharmacy Consult for heparin infusion Indication: nSTEMI/ACS  Allergies  Allergen Reactions   Sulfa Antibiotics Rash    Mouth blisters Other reaction(s): Angioedema "whole mouth swells"   Gabapentin Diarrhea    Patient Measurements: Height: 5' (152.4 cm) Weight: 102.1 kg (225 lb) IBW/kg (Calculated) : 45.5 Heparin Dosing Weight: 70.4 kg  Vital Signs: Temp: 97.6 F (36.4 C) (09/23 0800) Temp Source: Oral (09/23 0800) BP: 106/63 (09/23 0800) Pulse Rate: 102 (09/23 0800)  Labs: Recent Labs    12/09/20 0415 12/09/20 0616 12/09/20 0920 12/09/20 1259 12/09/20 1519 12/09/20 1558 12/10/20 0225 12/10/20 0721 12/10/20 1305 12/10/20 2318 12/11/20 0627  HGB 12.3  --   --   --   --   --   --  10.7*  --   --   --   HCT 37.6  --   --   --   --   --   --  33.0*  --   --   --   PLT 190  --   --   --   --   --   --  150  --   --   --   APTT  --  28  --   --   --   --   --   --   --   --   --   LABPROT  --  14.1  --   --   --   --   --   --   --   --   --   INR  --  1.1  --   --   --   --   --   --   --   --   --   HEPARINUNFRC  --   --   --   --   --    < > 0.33  --  0.33  --  0.19*  CREATININE 2.02*  --   --   --   --   --   --  4.21*  --  5.17*  --   TROPONINIHS 105* 93*   < > 113* 133*  --   --  156*  --   --   --    < > = values in this interval not displayed.     Estimated Creatinine Clearance: 10.4 mL/min (A) (by C-G formula based on SCr of 5.17 mg/dL (H)).   Medical History: Past Medical History:  Diagnosis Date   Anxiety    Bell's palsy 08/2018   CHF (congestive heart failure) (HCC)    Chronic back pain    Chronic cough    CKD (chronic kidney disease), stage III (HCC)    Depression    Diabetes mellitus, type 2 (HCC)    GERD (gastroesophageal reflux disease)    Hyperlipidemia    Hypertension    Multilevel degenerative disc disease    Myocardial infarction [facility]) 2007   & 2014   Neuropathy    Osteoporosis      Assessment: Pt is 75 yo female presenting with generalized weakness, found w/ possible nSTEMI/ACS. Cardiology following. Do not suspect NSTEMI. Patient with atrial fibrillation, started on amiodarone drip and continues on heparin.  Goal of Therapy:  Heparin level 0.3-0.7 units/ml Monitor platelets by anticoagulation protocol: Yes   9/21 1558 HL 0.28, subtherapeutic 9/22 0225 HL 0.33, therapeutic x 1 9/22 1305 HL 0.33, therapeutic x2 9/23 0627 HL 0.19, subtherapeutic  Plan:  Heparin  level subtherapeutic Heparin 2000 unit bolus Increase heparin infusion to 1150 units/hr Check HL at 1800 CBC daily while on heparin  Pricilla Riffle, PharmD, BCPS Clinical Pharmacist 12/11/2020 8:46 AM

## 2020-12-11 NOTE — Consult Note (Signed)
Palliative Care Consult Note                                  Date: 12/11/2020   Patient Name: Jamie Benitez  DOB: [birth year]  MRN: [id number]  Age / Sex: 75 y.o., female  PCP: Derinda Late, MD Referring Physician: Sidney Ace, MD  Reason for Consultation: Establishing goals of care  HPI/Patient Profile: 75 y.o. female  with past medical history of sCHF with EF 35%, CAD with stent placement, CKD-3A, chronic back pain, Bell's palsy, HTN, hyperlipidemia, diabetes mellitus, GERD, depression with anxiety admitted on 12/09/2020 with cc of weakness. Patient with 4 days previous weakness. Initial concerns for NSTEMI with troponin 105 and ST depression but cardiology feels likely demand ischemia and not MI. Recommended medical management.   She developed a tachyarrhythmia with AFib (no previous history) as well as worsening AKI, hypotension and subsequently transferred to SD/ICU for pressor support, amiodarone gtt, nephrology consult.  Plans to change amio to oral tomorrow, cont'd medical management of CAD/elevated trop (heparin gtt, antiplatelet, statin). Biggest issue currently is possible MSSA bacteremia, unsure if true infection or contaminate; unclear source, on antibiotics. MRI spine essentially normal. Borderline dilated CBD with ongoing workup.  PMT consulted for [place] discussion.   Past Medical History:  Diagnosis Date  . Anxiety   . Bell's palsy 08/2018  . CHF (congestive heart failure) ([place])   . Chronic back pain   . Chronic cough   . CKD (chronic kidney disease), stage III ([place])   . Depression   . Diabetes mellitus, type 2 ([place])   . GERD (gastroesophageal reflux disease)   . Hyperlipidemia   . Hypertension   . Multilevel degenerative disc disease   . Myocardial infarction [facility]) 2007   & 2014  . Neuropathy   . Osteoporosis     Subjective:   This NP Walden Field reviewed medical records, received report from team,  assessed the patient and then meet at the patient's bedside to discuss diagnosis, prognosis, GOC, EOL wishes disposition and options.  I met with the patient and her grandson (whom she lives with) Natale Lay at the bedside.   Concept of Palliative Care was introduced as specialized medical care for people and their families living with serious illness.  If focuses on providing relief from the symptoms and stress of a serious illness.  The goal is to improve quality of life for both the patient and the family. Values and goals of care important to patient and family were attempted to be elicited.  Created space and opportunity for patient  and family to explore thoughts and feelings regarding current medical situation   Natural trajectory and current clinical status were discussed. Questions and concerns addressed. Patient  encouraged to call with questions or concerns.    Patient/Family Understanding of Illness: She is not sure about everything going on. Possibly some confusion as she is able to confirm her past history and current acute issues when described to her. Jamie Benitez is aware of her current clinical issues. We discussed in more detail the multiple issues ongoing and where she's at right now.  Life Review: The patient has worked multiple jobs over the years, unable to elaborate on specifics.  Today's Discussion: Updated Ms. Sharp and [place] about current issues. Most pressing at this time is MSS infection and AKI on CKD. Discussed worsening kidney function and mentioned the possibility of needing dialysis.  She states she doesn't like this idea and is hoping to avoid it, but she would accept it if it was needed. They both understand she is very sick right now.  The patient lives with her son Jamie Benitez. She has a daughter Jamie Benitez) who lives locally and she describes them as not being close. She also has a son Jamie Benitez who lives out of town whom she feels she is close to. She expresses desire  for her grandson Jamie Benitez to be her HCPOA. I explained without HCPOA paperwork that it would fall to her son and daughter. She agrees to spiritual care consult to complete HCPOA paperwork. Jamie Benitez says she has told him this before so he's not surprised and he supports her decision for him to be HCPOA.  We discuss taking 1 day at a time, watchful waiting to see how her body does in the coming days. We discuss continuing to meet and make decisions based on how she progresses, which she says she agrees with.  Provider emotional support, answered all questions, addressed all concerns.  Review of Systems  Constitutional:  Positive for fatigue.  Respiratory:  Negative for shortness of breath.   Cardiovascular:  Negative for chest pain.  Gastrointestinal:  Negative for abdominal pain, nausea and vomiting.  Musculoskeletal:  Negative for back pain.   Objective:   Primary Diagnoses: Present on Admission: . NSTEMI (non-ST elevated myocardial infarction) ([place]) . Chronic systolic CHF (congestive heart failure) ([place]) . Chronic back pain . Acute renal failure superimposed on stage 3a chronic kidney disease ([place]) . Depression with anxiety . Type II diabetes mellitus with renal manifestations ([place]) . CAD (coronary artery disease) . HLD (hyperlipidemia) . HTN (hypertension) . Hypokalemia . Leukocytosis   Physical Exam Vitals and nursing note reviewed.  Constitutional:      General: She is sleeping.  HENT:     Head: Normocephalic and atraumatic.  Cardiovascular:     Rate and Rhythm: Normal rate.  Pulmonary:     Effort: No respiratory distress.  Abdominal:     General: Abdomen is flat.     Palpations: Abdomen is soft.  Skin:    General: Skin is warm and dry.  Neurological:     Mental Status: She is easily aroused.  Psychiatric:        Mood and Affect: Mood normal.        Behavior: Behavior normal.    Vital Signs:  BP 97/73   Pulse (!) 57   Temp 97.6 F (36.4 C) (Oral)   Resp 19    Ht 5' (1.524 m)   Wt 102.1 kg   SpO2 98%   BMI 43.94 kg/m   Palliative Assessment/Data: 30%    Advanced Care Planning:   Primary Decision Maker: PATIENT  Code Status/Advance Care Planning: DNR  A discussion was had today regarding advanced directives. Concepts specific to code status, artifical feeding and hydration, continued IV antibiotics and rehospitalization was had.  The difference between a aggressive medical intervention path and a palliative comfort care path for this patient at this time was had.  Decisions/Changes to ACP: Patient desires grandson to be HCPOA; spiritual care consult placed Remain DNR  Assessment & Plan:   Impression: 75 year old female admitted for weakness found with MSSA bacteremia, new AFib with RVR, back pain, AKI on CKD. Question of NSTEMI but cardiology doesn't think so. Worsening kidney function, may need dialysis, nephrology on board. Hypotension, off pressors for now. MSSA with unknown source, MRI spine did not document  any spinal abscess but borderline CBD dilation (LFTs ordered). Overall several acute on chronic clinical concerns.  SUMMARY OF RECOMMENDATIONS   Continue to treat the treatable Patient agreeable to dialysis if needed, but hopes it is not Spiritual Care consulted for [facility] paperwork completion to name grandson Natale Lay as [facility] Watchful waiting Further discussions, decisions pending her clinical progress PMT will continue to follow  Symptom Management:  Per primary team PMT available to assist as needed  Prognosis:  Unable to determine  Discharge Planning:  To Be Determined   Discussed with: Patient, Jamie Benitez (grandson), nursing staff, Dr. Priscella Mann   Thank you for allowing [place] to participate in the care of Arleth SANTINA TRILLO PMT will continue to support holistically.  Time Total: 110 min  Greater than 50%  of this time was spent counseling and coordinating care related to the above assessment and  plan.  Signed by: Walden Field, NP Palliative Medicine Team  Team Phone # [phone] (Nights/Weekends)  12/11/2020, 1:58 PM

## 2020-12-12 DIAGNOSIS — N179 Acute kidney failure, unspecified: Secondary | ICD-10-CM | POA: Diagnosis not present

## 2020-12-12 DIAGNOSIS — I248 Other forms of acute ischemic heart disease: Secondary | ICD-10-CM | POA: Diagnosis not present

## 2020-12-12 DIAGNOSIS — R7881 Bacteremia: Secondary | ICD-10-CM | POA: Diagnosis not present

## 2020-12-12 DIAGNOSIS — B9561 Methicillin susceptible Staphylococcus aureus infection as the cause of diseases classified elsewhere: Secondary | ICD-10-CM | POA: Diagnosis not present

## 2020-12-12 LAB — GLUCOSE, CAPILLARY
Glucose-Capillary: 94 mg/dL (ref 70–99)
Glucose-Capillary: 93 mg/dL (ref 70–99)
Glucose-Capillary: 98 mg/dL (ref 70–99)
Glucose-Capillary: 120 mg/dL — ABNORMAL HIGH (ref 70–99)

## 2020-12-12 LAB — CBC
HCT: 27.5 % — ABNORMAL LOW (ref 36.0–46.0)
Hemoglobin: 9.8 g/dL — ABNORMAL LOW (ref 12.0–15.0)
MCH: 28.1 pg (ref 26.0–34.0)
MCHC: 35.6 g/dL (ref 30.0–36.0)
MCV: 78.8 fL — ABNORMAL LOW (ref 80.0–100.0)
Platelets: 155 10*3/uL (ref 150–400)
RBC: 3.49 MIL/uL — ABNORMAL LOW (ref 3.87–5.11)
RDW: 15.4 % (ref 11.5–15.5)
WBC: 16 10*3/uL — ABNORMAL HIGH (ref 4.0–10.5)
nRBC: 0 % (ref 0.0–0.2)

## 2020-12-12 LAB — CBC WITH DIFFERENTIAL/PLATELET
Abs Immature Granulocytes: 0.08 10*3/uL — ABNORMAL HIGH (ref 0.00–0.07)
Basophils Absolute: 0.1 10*3/uL (ref 0.0–0.1)
Basophils Relative: 0 %
Eosinophils Absolute: 0.2 10*3/uL (ref 0.0–0.5)
Eosinophils Relative: 1 %
HCT: 27.1 % — ABNORMAL LOW (ref 36.0–46.0)
Hemoglobin: 9.8 g/dL — ABNORMAL LOW (ref 12.0–15.0)
Immature Granulocytes: 1 %
Lymphocytes Relative: 6 %
Lymphs Abs: 0.9 10*3/uL (ref 0.7–4.0)
MCH: 28 pg (ref 26.0–34.0)
MCHC: 36.2 g/dL — ABNORMAL HIGH (ref 30.0–36.0)
MCV: 77.4 fL — ABNORMAL LOW (ref 80.0–100.0)
Monocytes Absolute: 0.7 10*3/uL (ref 0.1–1.0)
Monocytes Relative: 4 %
Neutro Abs: 14.1 10*3/uL — ABNORMAL HIGH (ref 1.7–7.7)
Neutrophils Relative %: 88 %
Platelets: 173 10*3/uL (ref 150–400)
RBC: 3.5 MIL/uL — ABNORMAL LOW (ref 3.87–5.11)
RDW: 15.2 % (ref 11.5–15.5)
Smear Review: NORMAL
WBC: 16 10*3/uL — ABNORMAL HIGH (ref 4.0–10.5)
nRBC: 0 % (ref 0.0–0.2)

## 2020-12-12 LAB — COMPREHENSIVE METABOLIC PANEL
ALT: 7 U/L (ref 0–44)
AST: 101 U/L — ABNORMAL HIGH (ref 15–41)
Albumin: 2 g/dL — ABNORMAL LOW (ref 3.5–5.0)
Alkaline Phosphatase: 110 U/L (ref 38–126)
Anion gap: 19 — ABNORMAL HIGH (ref 5–15)
BUN: 75 mg/dL — ABNORMAL HIGH (ref 8–23)
CO2: 27 mmol/L (ref 22–32)
Calcium: 6 mg/dL — CL (ref 8.9–10.3)
Chloride: 82 mmol/L — ABNORMAL LOW (ref 98–111)
Creatinine, Ser: 5.06 mg/dL — ABNORMAL HIGH (ref 0.44–1.00)
GFR, Estimated: 9 mL/min — ABNORMAL LOW (ref >60)
Glucose, Bld: 105 mg/dL — ABNORMAL HIGH (ref 70–99)
Potassium: 2.8 mmol/L — ABNORMAL LOW (ref 3.5–5.1)
Sodium: 128 mmol/L — ABNORMAL LOW (ref 135–145)
Total Bilirubin: 0.7 mg/dL (ref 0.3–1.2)
Total Protein: 4.8 g/dL — ABNORMAL LOW (ref 6.5–8.1)

## 2020-12-12 LAB — HEPARIN LEVEL (UNFRACTIONATED)
Heparin Unfractionated: 0.47 IU/mL (ref 0.30–0.70)
Heparin Unfractionated: 0.41 IU/mL (ref 0.30–0.70)

## 2020-12-12 LAB — PROTEIN / CREATININE RATIO, URINE
Creatinine, Urine: 170 mg/dL
Protein Creatinine Ratio: 0.37 mg/mg{Cre} — ABNORMAL HIGH (ref 0.00–0.15)
Total Protein, Urine: 63 mg/dL

## 2020-12-12 MED ORDER — SODIUM CHLORIDE 0.9 % IV SOLN: INTRAVENOUS | Status: DC

## 2020-12-12 MED ORDER — OXYCODONE HCL 5 MG PO TABS
5.0000 mg | ORAL_TABLET | ORAL | Status: DC | PRN
  Administered 2020-12-12 – 2020-12-19 (×13): 5 mg via ORAL
  Filled 2020-12-12 (×13): qty 1

## 2020-12-12 MED ORDER — CALCITRIOL 0.25 MCG PO CAPS
0.2500 ug | ORAL_CAPSULE | Freq: Every day | ORAL | Status: DC
  Administered 2020-12-12 – 2020-12-17 (×3): 0.25 ug via ORAL
  Filled 2020-12-12 (×8): qty 1

## 2020-12-12 MED ORDER — POTASSIUM CHLORIDE CRYS ER 20 MEQ PO TBCR
40.0000 meq | EXTENDED_RELEASE_TABLET | Freq: Once | ORAL | Status: AC
  Administered 2020-12-12: 40 meq via ORAL
  Filled 2020-12-12: qty 2

## 2020-12-12 MED ORDER — SODIUM CHLORIDE 0.9 % IV BOLUS
500.0000 mL | Freq: Once | INTRAVENOUS | Status: AC
  Administered 2020-12-12: 500 mL via INTRAVENOUS

## 2020-12-12 MED ORDER — CALCIUM CARBONATE 1250 (500 CA) MG PO TABS
1000.0000 mg | ORAL_TABLET | Freq: Two times a day (BID) | ORAL | Status: DC
  Administered 2020-12-12 – 2020-12-17 (×6): 1000 mg via ORAL
  Filled 2020-12-12 (×11): qty 2

## 2020-12-12 MED ORDER — POTASSIUM CHLORIDE 20 MEQ PO PACK
40.0000 meq | PACK | Freq: Two times a day (BID) | ORAL | Status: DC
  Filled 2020-12-12: qty 2

## 2020-12-12 NOTE — Progress Notes (Signed)
PROGRESS NOTE    Jamie Benitez  ZOX:[id number] DOB: [birth year] DOA: 12/09/2020 PCP: Kandyce Rud, MD    Brief Narrative:  75 y.o. female with medical history significant of sCHF with EF 35%, CAD with stent placement, CKD-3A, chronic back pain, Bell's palsy, HTN, hyperlipidemia, diabetes mellitus, GERD, depression with anxiety, who presents with weakness.   Patient states that she has generalized weakness for more than 4 days.  No unilateral numbness or tingling to extremities.  No facial droop or slurred speech.  Patient has dry cough, denies chest pain or shortness breath.  No fever or chills.  Patient states that she had diarrhea in the past several days, which has resolved.  Currently no nausea, vomiting, diarrhea or abdominal pain.  No symptoms of UTI.  Patient complains of chronic lower back pain. Initially patient had oxygen desaturation to mid 80s, which improved to 98% on room air in ED.   Initial trop 105 with ST depression and T wave inversion in inferior leads and anterior leads.  Dr. Okey Dupre of cardiology was consulted, who did not think patient had STEMI. He recommended [facility] cardiology consult. IV heparin is started in ED  Cardiology patient's presentation is inconsistent with NSTEMI.  No significant delta in troponin.  VQ scan and lower extremity duplex negative for VTE.  Patient did have elements of mild BNP elevation and small left pleural effusion possible decompensated heart failure.  Was not given diuretics due to elevated kidney function.  Subsequently patient went into a tachyarrhythmia likely atrial fibrillation which she does not have a history of.  Also creatinine worsened from 2-4.2.  Nephrology consult 9/22.  9/23: Patient was moved to stepdown unit for closer monitoring due to persistent hypotension.  Not requiring vasopressor support.  Remains on amiodarone.  Remains in atrial fibrillation, rate control improved.  Kidney function continues to deteriorate.  Discussed with  nephrology.  Currently no indications for hemodialysis though we will continue to follow carefully   Assessment & Plan:   Principal Problem:   NSTEMI (non-ST elevated myocardial infarction) (HCC) Active Problems:   Chronic systolic CHF (congestive heart failure) (HCC)   Chronic back pain   Acute renal failure superimposed on stage 3a chronic kidney disease (HCC)   Depression with anxiety   Type II diabetes mellitus with renal manifestations (HCC)   CAD (coronary artery disease)   HLD (hyperlipidemia)   HTN (hypertension)   Hypokalemia   Leukocytosis   Acute decompensated heart failure (HCC)  NSTEMI and history of CAD: S/p of stent placement  trop  105 --> 93 --> 93.   Patient does not have chest pain, but has generalized weakness.   Dr. Juliann Pares of cardiology is consulted.   D-dimer is positive, but VQ scan negative for PE.   Lower extremity Dopplers negative for DVT. Per cardiology patient's presentation is inconsistent with NSTEMI Plan: Continue heparin GTT- Continue antiplatelet therapy High intensity statin Telemetry monitoring  New onset atrial fibrillation with rapid ventricular response Noted on telemetry 9/22 Ventricular rates up to 130s Plan: Continue amnio gtt. for today. P.o. intake is been questionable Anticipate transition to p.o. amiodarone 200 twice daily from tomorrow patient able to tolerate p.o.   Chronic systolic CHF (congestive heart failure) (HCC) 2D echo on 05/22/2017 showed EF of 35.  Patient has 1+ leg edema, elevated BNP 398, indicating possible fluid overload.   Since patient has worsening renal function, will not start diuretics now -Watch volume status closely -Nephrology recommending gentle IV fluids for deteriorating kidney  function   Chronic back pain -As needed Tylenol -continue home tramadol   Acute renal failure superimposed on stage 3a chronic kidney disease (HCC):  Metabolic acidosis  recent baseline creatinine 1.2 on 10/28/2019.    Her creatinine is at 2.02, BUN 35 on admission Creatinine progressively worsening Nephrology consulted Plan: Stop IV bicarbonate Avoid diuretics and nephrotoxins Normal saline 75 cc/h Daily renal function    Depression with anxiety -Continue home medications   Type II diabetes mellitus with renal manifestations (HCC) Recent A1c 5.8, well controlled.   Patient is taking 70/30 NPH insulin and glucose at home. -Sliding scale insulin -Decrease home 70/30 NPH insulin dose from 10 to 8 units twice daily   HLD (hyperlipidemia) -Lipitor   HTN (hypertension) -Hold lisinopril -IV hydralazine as needed -Imdur on hold   Hypokalemia Monitor and replace as necessary Maintain K greater than 4, mag greater than 2  Possible MSSA bacteremia Unclear what this this represents infection or true contaminant Does not meet sepsis criteria No clear source identified TTE negative MRI spine negative Plan: Continue Ancef We will reach out to cardiology regarding TEE recommendation  Possible bile duct dilatation Noted partial on MRI Will check LFTs If elevated will get dedicated biliary tract imaging  DVT prophylaxis: Heparin GTT Code Status: DNR Family Communication: Lanier Prude [phone] on 9/22 Disposition Plan: Status is: Inpatient  Remains inpatient appropriate because:Inpatient level of care appropriate due to severity of illness  Dispo: The patient is from: Home              Anticipated d/c is to: SNF              Patient currently is not medically stable to d/c.   Difficult to place patient No  Patient with numerous acute issues.  Currently in stepdown status.  Disposition plan pending.     Level of care: Progressive Cardiac  Consultants:  Nephrology Cardiology  Procedures:  None  Antimicrobials:  Cefazolin   Subjective: Patient seen and examined.  More awake this morning.  Complaining of pain.  Objective: Vitals:   12/12/20 1000 12/12/20 1100  12/12/20 1200 12/12/20 1247  BP: 125/61 110/60 120/84 125/61  Pulse: 67 83 90 90  Resp: 18 18 (!) 21   Temp:   98.2 F (36.8 C)   TempSrc:   Oral   SpO2: 95% 96% 96% 95%  Weight:      Height:        Intake/Output Summary (Last 24 hours) at 12/12/2020 1324 Last data filed at 12/12/2020 1100 Gross per 24 hour  Intake 5050.54 ml  Output 0 ml  Net 5050.54 ml   Filed Weights   12/09/20 0354  Weight: 102.1 kg    Examination:  General exam: Seen and examined.  Mild distress due to pain. Respiratory system: Poor respiratory effort.  Lungs clear.  Normal work of breathing.  1 L Cardiovascular system:  S1-S2, regular rate, irregular rhythm, no murmurs Gastrointestinal system: Soft, nontender, nondistended, normal bowel sounds Central nervous system: Lethargic but oriented.  No focal deficits Extremities: Diffusely decreased power bilaterally.  Range of motion intact Skin: No rashes, lesions or ulcers Psychiatry: Judgement and insight appear normal. Mood & affect appropriate.     Data Reviewed: I have personally reviewed following labs and imaging studies  CBC: Recent Labs  Lab 12/09/20 0415 12/10/20 0721 12/12/20 0355 12/12/20 1158  WBC 11.4* 13.9* 16.0* 16.0*  NEUTROABS 9.1*  --   --  14.1*  HGB 12.3 10.7* 9.8*  9.8*  HCT 37.6 33.0* 27.5* 27.1*  MCV 86.0 84.2 78.8* 77.4*  PLT 190 150 155 173   Basic Metabolic Panel: Recent Labs  Lab 12/09/20 0415 12/10/20 0721 12/10/20 2318 12/11/20 1815 12/12/20 1158  NA 134* 132* 130* 128* 128*  K 3.4* 3.8 3.8 3.5 2.8*  CL 100 98 100 93* 82*  CO2 22 18* 18* 20* 27  GLUCOSE 238* 113* 128* 97 105*  BUN 35* 57* 65* 70* 75*  CREATININE 2.02* 4.21* 5.17* 5.28* 5.06*  CALCIUM 8.5* 8.0* 7.3* 6.4* 6.0*  MG 1.8  --   --   --   --    GFR: Estimated Creatinine Clearance: 10.6 mL/min (A) (by C-G formula based on SCr of 5.06 mg/dL (H)). Liver Function Tests: Recent Labs  Lab 12/09/20 0415 12/11/20 0627 12/11/20 1815  12/12/20 1158  AST 37 76* 101* 101*  ALT 24 13 11 7   ALKPHOS 85 82 93 110  BILITOT 1.2 0.7 0.7 0.7  PROT 6.9 5.3* 4.2* 4.8*  ALBUMIN 3.5 2.3*  2.4* 1.8* 2.0*   Recent Labs  Lab 12/09/20 0415  LIPASE 35   No results for input(s): AMMONIA in the last 168 hours. Coagulation Profile: Recent Labs  Lab 12/09/20 0616  INR 1.1   Cardiac Enzymes: No results for input(s): CKTOTAL, CKMB, CKMBINDEX, TROPONINI in the last 168 hours. BNP (last 3 results) No results for input(s): PROBNP in the last 8760 hours. HbA1C: No results for input(s): HGBA1C in the last 72 hours.  CBG: Recent Labs  Lab 12/11/20 1230 12/11/20 1610 12/11/20 2201 12/12/20 0740 12/12/20 1159  GLUCAP 96 95 106* 94 93   Lipid Profile: Recent Labs    12/10/20 0721  CHOL 94  HDL 33*  LDLCALC 37  TRIG 161  CHOLHDL 2.8   Thyroid Function Tests: Recent Labs    12/11/20 0627  TSH 2.222   Anemia Panel: No results for input(s): VITAMINB12, FOLATE, FERRITIN, TIBC, IRON, RETICCTPCT in the last 72 hours. Sepsis Labs: Recent Labs  Lab 12/09/20 0434 12/11/20 0627 12/11/20 0832 12/11/20 1040  LATICACIDVEN 1.8 1.2 1.3 1.7    Recent Results (from the past 240 hour(s))  Resp Panel by RT-PCR (Flu A&B, Covid) Nasopharyngeal Swab     Status: None   Collection Time: 12/09/20  4:34 AM   Specimen: Nasopharyngeal Swab; Nasopharyngeal(NP) swabs in vial transport medium  Result Value Ref Range Status   SARS Coronavirus 2 by RT PCR NEGATIVE NEGATIVE Final    Comment: (NOTE) SARS-CoV-2 target nucleic acids are NOT DETECTED.  The SARS-CoV-2 RNA is generally detectable in upper respiratory specimens during the acute phase of infection. The lowest concentration of SARS-CoV-2 viral copies this assay can detect is 138 copies/mL. A negative result does not preclude SARS-Cov-2 infection and should not be used as the sole basis for treatment or other patient management decisions. A negative result may occur with   improper specimen collection/handling, submission of specimen other than nasopharyngeal swab, presence of viral mutation(s) within the areas targeted by this assay, and inadequate number of viral copies(<138 copies/mL). A negative result must be combined with clinical observations, patient history, and epidemiological information. The expected result is Negative.  Fact Sheet for Patients:  BloggerCourse.com  Fact Sheet for Healthcare Providers:  SeriousBroker.it  This test is no t yet approved or cleared by the [place] FDA and  has been authorized for detection and/or diagnosis of SARS-CoV-2 by FDA under an Emergency Use Authorization (EUA). This EUA will remain  in  effect (meaning this test can be used) for the duration of the COVID-19 declaration under Section 564(b)(1) of the Act, 21 U.S.C.section 360bbb-3(b)(1), unless the authorization is terminated  or revoked sooner.       Influenza A by PCR NEGATIVE NEGATIVE Final   Influenza B by PCR NEGATIVE NEGATIVE Final    Comment: (NOTE) The Xpert Xpress SARS-CoV-2/FLU/RSV plus assay is intended as an aid in the diagnosis of influenza from Nasopharyngeal swab specimens and should not be used as a sole basis for treatment. Nasal washings and aspirates are unacceptable for Xpert Xpress SARS-CoV-2/FLU/RSV testing.  Fact Sheet for Patients: BloggerCourse.com  Fact Sheet for Healthcare Providers: SeriousBroker.it  This test is not yet approved or cleared by the [place] FDA and has been authorized for detection and/or diagnosis of SARS-CoV-2 by FDA under an Emergency Use Authorization (EUA). This EUA will remain in effect (meaning this test can be used) for the duration of the COVID-19 declaration under Section 564(b)(1) of the Act, 21 U.S.C. section 360bbb-3(b)(1), unless the authorization is terminated  or revoked.  Performed at [facility] [address] [place], [place] [address]   Blood culture (routine single)     Status: Abnormal   Collection Time: 12/09/20  4:34 AM   Specimen: BLOOD  Result Value Ref Range Status   Specimen Description   Final    BLOOD LEFT ARM Performed at [facility], [address] [place], [place] [address]    Special Requests   Final    BOTTLES DRAWN AEROBIC AND ANAEROBIC Blood Culture adequate volume Performed at [facility], [address] [place], [place] [address]    Culture  Setup Time   Final    GRAM POSITIVE COCCI IN BOTH AEROBIC AND ANAEROBIC BOTTLES CRITICAL RESULT CALLED TO, READ BACK BY AND VERIFIED WITH: Lelon Mast RAEUR AT 1750 ON 12/09/20 BY SS Performed at [facility], [address], [place], [place] [address]    Culture STAPHYLOCOCCUS AUREUS (A)  Final   Report Status 12/11/2020 FINAL  Final   Organism ID, Bacteria STAPHYLOCOCCUS AUREUS  Final      Susceptibility   Staphylococcus aureus - MIC*    CIPROFLOXACIN 1 SENSITIVE Sensitive     ERYTHROMYCIN <=0.25 SENSITIVE Sensitive     GENTAMICIN <=0.5 SENSITIVE Sensitive     OXACILLIN 0.5 SENSITIVE Sensitive     TETRACYCLINE <=1 SENSITIVE Sensitive     VANCOMYCIN <=0.5 SENSITIVE Sensitive     TRIMETH/SULFA <=10 SENSITIVE Sensitive     CLINDAMYCIN <=0.25 SENSITIVE Sensitive     RIFAMPIN <=0.5 SENSITIVE Sensitive     Inducible Clindamycin NEGATIVE Sensitive     * STAPHYLOCOCCUS AUREUS  Blood Culture ID Panel (Reflexed)     Status: Abnormal   Collection Time: 12/09/20  4:34 AM  Result Value Ref Range Status   Enterococcus faecalis NOT DETECTED NOT DETECTED Final   Enterococcus Faecium NOT DETECTED NOT DETECTED Final   Listeria monocytogenes NOT DETECTED NOT DETECTED Final   Staphylococcus species DETECTED (A) NOT DETECTED Final    Comment: CRITICAL RESULT CALLED TO, READ BACK BY AND VERIFIED WITH: SAMANTHA RAEUR AT 1750 ON 12/09/20 BY SS     Staphylococcus aureus (BCID) DETECTED (A) NOT DETECTED Final    Comment: CRITICAL RESULT CALLED TO, READ BACK BY AND VERIFIED WITH: SAMANTHA RAEUR AT 1750 ON 12/09/20 BY SS    Staphylococcus epidermidis NOT DETECTED NOT DETECTED Final   Staphylococcus lugdunensis NOT DETECTED NOT DETECTED Final   Streptococcus  species NOT DETECTED NOT DETECTED Final   Streptococcus agalactiae NOT DETECTED NOT DETECTED Final   Streptococcus pneumoniae NOT DETECTED NOT DETECTED Final   Streptococcus pyogenes NOT DETECTED NOT DETECTED Final   A.calcoaceticus-baumannii NOT DETECTED NOT DETECTED Final   Bacteroides fragilis NOT DETECTED NOT DETECTED Final   Enterobacterales NOT DETECTED NOT DETECTED Final   Enterobacter cloacae complex NOT DETECTED NOT DETECTED Final   Escherichia coli NOT DETECTED NOT DETECTED Final   Klebsiella aerogenes NOT DETECTED NOT DETECTED Final   Klebsiella oxytoca NOT DETECTED NOT DETECTED Final   Klebsiella pneumoniae NOT DETECTED NOT DETECTED Final   Proteus species NOT DETECTED NOT DETECTED Final   Salmonella species NOT DETECTED NOT DETECTED Final   Serratia marcescens NOT DETECTED NOT DETECTED Final   Haemophilus influenzae NOT DETECTED NOT DETECTED Final   Neisseria meningitidis NOT DETECTED NOT DETECTED Final   Pseudomonas aeruginosa NOT DETECTED NOT DETECTED Final   Stenotrophomonas maltophilia NOT DETECTED NOT DETECTED Final   Candida albicans NOT DETECTED NOT DETECTED Final   Candida auris NOT DETECTED NOT DETECTED Final   Candida glabrata NOT DETECTED NOT DETECTED Final   Candida krusei NOT DETECTED NOT DETECTED Final   Candida parapsilosis NOT DETECTED NOT DETECTED Final   Candida tropicalis NOT DETECTED NOT DETECTED Final   Cryptococcus neoformans/gattii NOT DETECTED NOT DETECTED Final   Meth resistant mecA/C and MREJ NOT DETECTED NOT DETECTED Final    Comment: Performed at [facility], [address], [place], [place] [address]  Urine Culture      Status: Abnormal   Collection Time: 12/09/20  9:24 PM   Specimen: In/Out Cath Urine  Result Value Ref Range Status   Specimen Description   Final    IN/OUT CATH URINE Performed at [facility], [address] [place], [place] [address]    Special Requests   Final    NONE Performed at [facility], [address] [place], [place] [address]    Culture MULTIPLE SPECIES PRESENT, SUGGEST RECOLLECTION (A)  Final   Report Status 12/11/2020 FINAL  Final  CULTURE, BLOOD (ROUTINE X 2) w Reflex to ID Panel     Status: None (Preliminary result)   Collection Time: 12/10/20  4:29 PM   Specimen: BLOOD  Result Value Ref Range Status   Specimen Description BLOOD LEFT ANTECUBITAL  Final   Special Requests   Final    BOTTLES DRAWN AEROBIC ONLY Blood Culture adequate volume   Culture   Final    NO GROWTH 2 DAYS Performed at [facility], [address] [place], [place] [address]    Report Status PENDING  Incomplete  CULTURE, BLOOD (ROUTINE X 2) w Reflex to ID Panel     Status: None (Preliminary result)   Collection Time: 12/10/20  5:19 PM   Specimen: BLOOD  Result Value Ref Range Status   Specimen Description BLOOD BLOOD LEFT FOREARM  Final   Special Requests   Final    BOTTLES DRAWN AEROBIC AND ANAEROBIC Blood Culture adequate volume   Culture   Final    NO GROWTH 2 DAYS Performed at [facility], [address] [place], [place] [address]    Report Status PENDING  Incomplete  MRSA Next Gen by PCR, Nasal     Status: None   Collection Time: 12/11/20  4:29 AM   Specimen: Nasal Mucosa; Nasal Swab  Result Value Ref Range Status   MRSA by PCR Next Gen NOT DETECTED NOT DETECTED Final  Comment: (NOTE) The GeneXpert MRSA Assay (FDA approved for NASAL specimens only), is one component of a comprehensive MRSA colonization surveillance program. It is not intended to diagnose MRSA infection nor to guide or monitor treatment for MRSA infections. Test  performance is not FDA approved in patients less than 5 years old. Performed at [facility], [address], [place], [place] [address]          Radiology Studies: MR THORACIC SPINE WO CONTRAST  Result Date: 12/11/2020 CLINICAL DATA:  Low back pain, infection suspected; Mid-back pain, neuro deficit EXAM: MRI THORACIC AND LUMBAR SPINE WITHOUT CONTRAST TECHNIQUE: Multiplanar and multiecho pulse sequences of the thoracic and lumbar spine were obtained without intravenous contrast. COMPARISON:  None. FINDINGS: MRI THORACIC SPINE FINDINGS Alignment:  No substantial sagittal subluxation. Vertebrae: Vertebral body heights are maintained. No focal marrow edema to suggest osteomyelitis or acute fracture. Cord:  Normal cord signal. Paraspinal and other soft tissues: Appreciable paraspinal edema. Hiatal hernia. Disc levels: Mild disc bulging at T7-T8 and T8-T9 without significant canal stenosis. Small broad disc bulge with central disc protrusion at T9-T10 with ligamentum flavum thickening. Resulting mild canal stenosis. Multilevel facet arthropathy, greatest in the lower thoracic spine with mild bilateral foraminal stenosis at T8-T9, T9-T10, and T10-T11. Mild foraminal stenosis on the right at T11-T12. T12-L1 levels discussed below. MRI LUMBAR SPINE FINDINGS Segmentation:  Standard. Alignment:  Grade 1 anterolisthesis L4 on L5. Vertebrae: Edema within the T12-L1, L4-L5 and L5-S1 discs. Chronic appearing degenerative endplate signal changes at T12-L1 without significant marrow edema. Conus medullaris and cauda equina: Conus extends to the L1 level. Conus appears normal. Paraspinal and other soft tissues: Partially imaged dilated gallbladder with borderline dilated common bile duct, measuring approximately 6 mm. Left renal cysts. Disc levels: T12-L1: Degenerative disc height loss with disc edema. Posterior disc bulge with moderate bilateral facet hypertrophy. Ligamentum flavum thickening. Resulting  moderate right foraminal stenosis and mild canal stenosis. No significant left foraminal stenosis. L1-L2: Left eccentric disc bulge and mild left greater than right facet hypertrophy. Ligamentum flavum thickening. Resulting mild left foraminal stenosis without significant canal or right foraminal stenosis. L2-L3: Mild disc bulging and mild-to-moderate bilateral facet arthropathy. No significant canal or foraminal stenosis. L3-L4: Broad disc bulge with bulky ligamentum flavum thickening and moderate bilateral facet arthropathy. Resulting mild-to-moderate canal stenosis without significant foraminal stenosis. L4-L5: Right eccentric disc height loss. Grade 1 anterolisthesis of L4 on L5. Uncovering of the disc with superimposed small left subarticular disc protrusion. Moderate right greater than left facet hypertrophy. Resulting mild to moderate right subarticular and foraminal stenosis. No significant canal or left foraminal stenosis. L5-S1: Left eccentric disc height loss. Left eccentric disc bulge and endplate spurring. Moderate left mild right facet arthropathy. Resulting mild-to-moderate left foraminal stenosis with mild left subarticular recess narrowing. No significant canal or right foraminal stenosis. IMPRESSION: MR THORACIC SPINE IMPRESSION 1. Mild canal stenosis at T9-T10. Otherwise, multilevel small disc bulges without significant canal stenosis. 2. Mild multilevel foraminal stenosis in the lower thoracic spine, described above. 3. T12-L1 is discussed below. 4. Hiatal hernia. MR LUMBAR SPINE IMPRESSION 1. At T12-L1, moderate right foraminal stenosis with mild canal stenosis. Degenerative disc disease at this level with disc edema and slight left paraspinal edema, most likely degenerative/inflammatory in etiology. Infectious discitis is thought less likely but if there is clinical concern for infection then consider correlation with inflammatory markers and short interval follow-up MRI with contrast to ensure  stability. 2. At L3-L4, mild-to-moderate canal stenosis. 3. At L4-L5,  right eccentric degenerative change with mild-to-moderate right subarticular recess and foraminal stenosis. 4. At L5-S1, left eccentric degenerative change with mild-to-moderate left foraminal stenosis and mild left subarticular recess narrowing. 5. Partially imaged dilated gallbladder with borderline dilated common bile duct. This could be physiologic; however, consider correlation with liver function enzymes to exclude obstruction. Right upper quadrant ultrasound could also further evaluate if clinically indicated. Electronically Signed   By: Feliberto Harts M.D.   On: 12/11/2020 12:29   MR LUMBAR SPINE WO CONTRAST  Result Date: 12/11/2020 CLINICAL DATA:  Low back pain, infection suspected; Mid-back pain, neuro deficit EXAM: MRI THORACIC AND LUMBAR SPINE WITHOUT CONTRAST TECHNIQUE: Multiplanar and multiecho pulse sequences of the thoracic and lumbar spine were obtained without intravenous contrast. COMPARISON:  None. FINDINGS: MRI THORACIC SPINE FINDINGS Alignment:  No substantial sagittal subluxation. Vertebrae: Vertebral body heights are maintained. No focal marrow edema to suggest osteomyelitis or acute fracture. Cord:  Normal cord signal. Paraspinal and other soft tissues: Appreciable paraspinal edema. Hiatal hernia. Disc levels: Mild disc bulging at T7-T8 and T8-T9 without significant canal stenosis. Small broad disc bulge with central disc protrusion at T9-T10 with ligamentum flavum thickening. Resulting mild canal stenosis. Multilevel facet arthropathy, greatest in the lower thoracic spine with mild bilateral foraminal stenosis at T8-T9, T9-T10, and T10-T11. Mild foraminal stenosis on the right at T11-T12. T12-L1 levels discussed below. MRI LUMBAR SPINE FINDINGS Segmentation:  Standard. Alignment:  Grade 1 anterolisthesis L4 on L5. Vertebrae: Edema within the T12-L1, L4-L5 and L5-S1 discs. Chronic appearing degenerative endplate  signal changes at T12-L1 without significant marrow edema. Conus medullaris and cauda equina: Conus extends to the L1 level. Conus appears normal. Paraspinal and other soft tissues: Partially imaged dilated gallbladder with borderline dilated common bile duct, measuring approximately 6 mm. Left renal cysts. Disc levels: T12-L1: Degenerative disc height loss with disc edema. Posterior disc bulge with moderate bilateral facet hypertrophy. Ligamentum flavum thickening. Resulting moderate right foraminal stenosis and mild canal stenosis. No significant left foraminal stenosis. L1-L2: Left eccentric disc bulge and mild left greater than right facet hypertrophy. Ligamentum flavum thickening. Resulting mild left foraminal stenosis without significant canal or right foraminal stenosis. L2-L3: Mild disc bulging and mild-to-moderate bilateral facet arthropathy. No significant canal or foraminal stenosis. L3-L4: Broad disc bulge with bulky ligamentum flavum thickening and moderate bilateral facet arthropathy. Resulting mild-to-moderate canal stenosis without significant foraminal stenosis. L4-L5: Right eccentric disc height loss. Grade 1 anterolisthesis of L4 on L5. Uncovering of the disc with superimposed small left subarticular disc protrusion. Moderate right greater than left facet hypertrophy. Resulting mild to moderate right subarticular and foraminal stenosis. No significant canal or left foraminal stenosis. L5-S1: Left eccentric disc height loss. Left eccentric disc bulge and endplate spurring. Moderate left mild right facet arthropathy. Resulting mild-to-moderate left foraminal stenosis with mild left subarticular recess narrowing. No significant canal or right foraminal stenosis. IMPRESSION: MR THORACIC SPINE IMPRESSION 1. Mild canal stenosis at T9-T10. Otherwise, multilevel small disc bulges without significant canal stenosis. 2. Mild multilevel foraminal stenosis in the lower thoracic spine, described above. 3.  T12-L1 is discussed below. 4. Hiatal hernia. MR LUMBAR SPINE IMPRESSION 1. At T12-L1, moderate right foraminal stenosis with mild canal stenosis. Degenerative disc disease at this level with disc edema and slight left paraspinal edema, most likely degenerative/inflammatory in etiology. Infectious discitis is thought less likely but if there is clinical concern for infection then consider correlation with inflammatory markers and short interval follow-up MRI with contrast to ensure stability. 2. At L3-L4,  mild-to-moderate canal stenosis. 3. At L4-L5, right eccentric degenerative change with mild-to-moderate right subarticular recess and foraminal stenosis. 4. At L5-S1, left eccentric degenerative change with mild-to-moderate left foraminal stenosis and mild left subarticular recess narrowing. 5. Partially imaged dilated gallbladder with borderline dilated common bile duct. This could be physiologic; however, consider correlation with liver function enzymes to exclude obstruction. Right upper quadrant ultrasound could also further evaluate if clinically indicated. Electronically Signed   By: Feliberto Harts M.D.   On: 12/11/2020 12:29   DG Chest Port 1 View  Result Date: 12/11/2020 CLINICAL DATA:  75 year old female with history of acute respiratory failure with hypoxemia. EXAM: PORTABLE CHEST 1 VIEW COMPARISON:  Chest x-ray 12/09/2020. FINDINGS: Lung volumes are normal. No consolidative airspace disease. Linear opacities of the left lung base, similar to the prior study, favored to reflect subsegmental atelectasis or scarring. Possible small left pleural effusion. No right pleural effusion. No pneumothorax. No evidence of pulmonary edema. Mild cardiomegaly. Aortic atherosclerosis. IMPRESSION: 1. Possible small left pleural effusion with subsegmental atelectasis in the left lower lobe. 2. Cardiomegaly. 3. Aortic atherosclerosis. Electronically Signed   By: Trudie Reed M.D.   On: 12/11/2020 07:52    ECHOCARDIOGRAM COMPLETE  Result Date: 12/11/2020    ECHOCARDIOGRAM REPORT   Patient Name:   Jamie Benitez Date of Exam: 12/11/2020 Medical Rec #:  [id number]       Height:       60.0 in Accession #:    [id number]      Weight:       225.0 lb Date of Birth:  Aug 21, 1945        BSA:          1.962 m Patient Age:    75 years        BP:           97/73 mmHg Patient Gender: F               HR:           102 bpm. Exam Location:  [facility] Procedure: 2D Echo, Cardiac Doppler and Color Doppler Indications:     Bacteremia R78.81  History:         Patient has no prior history of Echocardiogram examinations.                  CHF, Previous Myocardial Infarction; Risk Factors:Hypertension                  and Diabetes.  Sonographer:     Cristela Blue Referring Phys:  [id number] Lynn Ito Diagnosing Phys: Lorine Bears MD  Sonographer Comments: Suboptimal apical window. IMPRESSIONS  1. Left ventricular ejection fraction, by estimation, is 25 to 30%. The left ventricle has severely decreased function. The left ventricle demonstrates regional wall motion abnormalities (see scoring diagram/findings for description). The left ventricular internal cavity size was mildly dilated. There is moderate left ventricular hypertrophy. Left ventricular diastolic parameters are indeterminate. There is akinesis of the left ventricular, entire inferior wall and inferolateral wall.  2. Right ventricular systolic function is normal. The right ventricular size is normal. Tricuspid regurgitation signal is inadequate for assessing PA pressure.  3. Left atrial size was moderately dilated.  4. Right atrial size was mildly dilated.  5. The mitral valve is normal in structure. Moderate mitral valve regurgitation. No evidence of mitral stenosis.  6. The aortic valve is normal in structure. Aortic valve regurgitation is not visualized. Mild to moderate aortic valve sclerosis/calcification is  present, without any evidence of aortic stenosis.  Conclusion(s)/Recommendation(s): No evidence of valvular vegetations on this transthoracic echocardiogram. FINDINGS  Left Ventricle: Left ventricular ejection fraction, by estimation, is 25 to 30%. The left ventricle has severely decreased function. The left ventricle demonstrates regional wall motion abnormalities. The left ventricular internal cavity size was mildly  dilated. There is moderate left ventricular hypertrophy. Left ventricular diastolic parameters are indeterminate. Right Ventricle: The right ventricular size is normal. No increase in right ventricular wall thickness. Right ventricular systolic function is normal. Tricuspid regurgitation signal is inadequate for assessing PA pressure. Left Atrium: Left atrial size was moderately dilated. Right Atrium: Right atrial size was mildly dilated. Pericardium: There is no evidence of pericardial effusion. Mitral Valve: The mitral valve is normal in structure. Moderate mitral valve regurgitation. No evidence of mitral valve stenosis. Tricuspid Valve: The tricuspid valve is normal in structure. Tricuspid valve regurgitation is not demonstrated. No evidence of tricuspid stenosis. Aortic Valve: The aortic valve is normal in structure. Aortic valve regurgitation is not visualized. Mild to moderate aortic valve sclerosis/calcification is present, without any evidence of aortic stenosis. Aortic valve mean gradient measures 2.5 mmHg. Aortic valve peak gradient measures 4.5 mmHg. Aortic valve area, by VTI measures 2.89 cm. Pulmonic Valve: The pulmonic valve was normal in structure. Pulmonic valve regurgitation is not visualized. No evidence of pulmonic stenosis. Aorta: The aortic root is normal in size and structure. Venous: The inferior vena cava was not well visualized. IAS/Shunts: No atrial level shunt detected by color flow Doppler.  LEFT VENTRICLE PLAX 2D LVIDd:         5.50 cm LVIDs:         4.60 cm LV PW:         1.40 cm LV IVS:        1.30 cm LVOT diam:      2.00 cm LV SV:         38 LV SV Index:   20 LVOT Area:     3.14 cm  RIGHT VENTRICLE RV Basal diam:  3.90 cm RV S prime:     11.60 cm/s TAPSE (M-mode): 3.6 cm LEFT ATRIUM             Index       RIGHT ATRIUM           Index LA diam:        5.10 cm 2.60 cm/m  RA Area:     21.20 cm LA Vol (A2C):   57.9 ml 29.50 ml/m RA Volume:   64.50 ml  32.87 ml/m LA Vol (A4C):   70.9 ml 36.13 ml/m LA Biplane Vol: 71.0 ml 36.18 ml/m  AORTIC VALVE                   PULMONIC VALVE AV Area (Vmax):    2.82 cm    PV Vmax:        0.80 m/s AV Area (Vmean):   2.90 cm    PV Peak grad:   2.6 mmHg AV Area (VTI):     2.89 cm    RVOT Peak grad: 4 mmHg AV Vmax:           105.50 cm/s AV Vmean:          76.700 cm/s AV VTI:            0.132 m AV Peak Grad:      4.5 mmHg AV Mean Grad:      2.5 mmHg LVOT  Vmax:         94.60 cm/s LVOT Vmean:        70.700 cm/s LVOT VTI:          0.122 m LVOT/AV VTI ratio: 0.92  AORTA Ao Root diam: 2.70 cm MITRAL VALVE               TRICUSPID VALVE MV Area (PHT): 4.80 cm    TR Peak grad:   9.2 mmHg MV Decel Time: 158 msec    TR Vmax:        152.00 cm/s MV E velocity: 66.80 cm/s                            SHUNTS                            Systemic VTI:  0.12 m                            Systemic Diam: 2.00 cm Lorine Bears MD Electronically signed by Lorine Bears MD Signature Date/Time: 12/11/2020/4:17:31 PM    Final         Scheduled Meds:  atorvastatin  80 mg Oral Daily   buPROPion  150 mg Oral Daily   Chlorhexidine Gluconate Cloth  6 each Topical Daily   clopidogrel  75 mg Oral Daily   hydrOXYzine  50 mg Oral Daily   insulin aspart  0-5 Units Subcutaneous QHS   insulin aspart  0-9 Units Subcutaneous TID WC   lidocaine  1 patch Transdermal Q24H   midodrine  10 mg Oral TID WC   pantoprazole  40 mg Oral Daily   potassium chloride  40 mEq Oral BID   traZODone  50 mg Oral QHS   Continuous Infusions:  sodium chloride 75 mL/hr at 12/12/20 1323   amiodarone 30 mg/hr (12/12/20 1100)     ceFAZolin (ANCEF) IV 2 g (12/12/20 0830)   heparin 1,300 Units/hr (12/12/20 1100)     LOS: 2 days    Time spent: 25 minutes    Tresa Moore, MD [facility] Pager [phone]  If 7PM-7AM, please contact night-coverage 12/12/2020, 1:24 PM

## 2020-12-12 NOTE — Progress Notes (Signed)
ANTICOAGULATION CONSULT NOTE  Pharmacy Consult for heparin infusion Indication: nSTEMI/ACS  Allergies  Allergen Reactions   Sulfa Antibiotics Rash    Mouth blisters Other reaction(s): Angioedema "whole mouth swells"   Gabapentin Diarrhea    Patient Measurements: Height: 5' (152.4 cm) Weight: 102.1 kg (225 lb) IBW/kg (Calculated) : 45.5 Heparin Dosing Weight: 70.4 kg  Vital Signs: Temp: 98.2 F (36.8 C) (09/24 0400) Temp Source: Oral (09/24 0400) BP: 109/56 (09/24 0400) Pulse Rate: 89 (09/24 0400)  Labs: Recent Labs    12/09/20 0616 12/09/20 0920 12/09/20 1259 12/09/20 1519 12/09/20 1558 12/10/20 0721 12/10/20 1305 12/10/20 2318 12/11/20 0627 12/11/20 1815 12/12/20 0355  HGB  --   --   --   --   --  10.7*  --   --   --   --  9.8*  HCT  --   --   --   --   --  33.0*  --   --   --   --  27.5*  PLT  --   --   --   --   --  150  --   --   --   --  155  APTT 28  --   --   --   --   --   --   --   --   --   --   LABPROT 14.1  --   --   --   --   --   --   --   --   --   --   INR 1.1  --   --   --   --   --   --   --   --   --   --   HEPARINUNFRC  --   --   --   --    < >  --    < >  --  0.19* 0.21* 0.47  CREATININE  --   --   --   --   --  4.21*  --  5.17*  --  5.28*  --   TROPONINIHS 93*   < > 113* 133*  --  156*  --   --   --   --   --    < > = values in this interval not displayed.     Estimated Creatinine Clearance: 10.2 mL/min (A) (by C-G formula based on SCr of 5.28 mg/dL (H)).   Medical History: Past Medical History:  Diagnosis Date   Anxiety    Bell's palsy 08/2018   CHF (congestive heart failure) (HCC)    Chronic back pain    Chronic cough    CKD (chronic kidney disease), stage III (HCC)    Depression    Diabetes mellitus, type 2 (HCC)    GERD (gastroesophageal reflux disease)    Hyperlipidemia    Hypertension    Multilevel degenerative disc disease    Myocardial infarction [facility]) 2007   & 2014   Neuropathy    Osteoporosis      Assessment: Pt is 75 yo female presenting with generalized weakness, found w/ possible nSTEMI/ACS. Cardiology following. Do not suspect NSTEMI. Patient with atrial fibrillation, started on amiodarone drip and continues on heparin.  Goal of Therapy:  Heparin level 0.3-0.7 units/ml Monitor platelets by anticoagulation protocol: Yes   9/21 1558 HL 0.28, subtherapeutic 9/22 0225 HL 0.33, therapeutic x 1 9/22 1305 HL 0.33, therapeutic x2 9/23 0627 HL 0.19, subtherapeutic 9/23  1815 HL 0.21, subtherapeutic  9/24 0355 HL 0.47, therapeutic x 1  Plan:  Continue heparin infusion at 1300 units/hr Recheck HL in 8 hours to confirm CBC daily while on heparin  Otelia Sergeant, PharmD, [facility] 12/12/2020 4:57 AM

## 2020-12-12 NOTE — Progress Notes (Signed)
ANTICOAGULATION CONSULT NOTE  Pharmacy Consult for heparin infusion Indication: nSTEMI/ACS  Allergies  Allergen Reactions   Sulfa Antibiotics Rash    Mouth blisters Other reaction(s): Angioedema "whole mouth swells"   Gabapentin Diarrhea    Patient Measurements: Height: 5' (152.4 cm) Weight: 102.1 kg (225 lb) IBW/kg (Calculated) : 45.5 Heparin Dosing Weight: 70.4 kg  Vital Signs: Temp: 98.2 F (36.8 C) (09/24 1200) Temp Source: Oral (09/24 1200) BP: 120/84 (09/24 1200) Pulse Rate: 90 (09/24 1200)  Labs: Recent Labs    12/09/20 1259 12/09/20 1519 12/09/20 1558 12/10/20 0721 12/10/20 1305 12/10/20 2318 12/11/20 0627 12/11/20 1815 12/12/20 0355 12/12/20 1158  HGB  --   --    < > 10.7*  --   --   --   --  9.8* 9.8*  HCT  --   --   --  33.0*  --   --   --   --  27.5* 27.1*  PLT  --   --   --  150  --   --   --   --  155 173  HEPARINUNFRC  --   --    < >  --    < >  --    < > 0.21* 0.47 0.41  CREATININE  --   --   --  4.21*  --  5.17*  --  5.28*  --   --   TROPONINIHS 113* 133*  --  156*  --   --   --   --   --   --    < > = values in this interval not displayed.     Estimated Creatinine Clearance: 10.2 mL/min (A) (by C-G formula based on SCr of 5.28 mg/dL (H)).   Medical History: Past Medical History:  Diagnosis Date   Anxiety    Bell's palsy 08/2018   CHF (congestive heart failure) (HCC)    Chronic back pain    Chronic cough    CKD (chronic kidney disease), stage III (HCC)    Depression    Diabetes mellitus, type 2 (HCC)    GERD (gastroesophageal reflux disease)    Hyperlipidemia    Hypertension    Multilevel degenerative disc disease    Myocardial infarction [facility]) 2007   & 2014   Neuropathy    Osteoporosis     Assessment: Pt is 75 yo female presenting with generalized weakness, found w/ possible nSTEMI/ACS. Cardiology following. Do not suspect NSTEMI. Patient with atrial fibrillation, started on amiodarone drip and continues on heparin.  Goal  of Therapy:  Heparin level 0.3-0.7 units/ml Monitor platelets by anticoagulation protocol: Yes   9/21 1558 HL 0.28, subtherapeutic 9/22 0225 HL 0.33, therapeutic x 1 9/22 1305 HL 0.33, therapeutic x2 9/23 0627 HL 0.19, subtherapeutic 9/23 1815 HL 0.21, subtherapeutic  9/24 0355 HL 0.47, therapeutic x 1 9/24 1158 HL 0.41, therapeutic x 2   Plan:  Continue heparin infusion at 1300 units/hr Recheck HL with AM labs  CBC daily while on heparin  Sharen Hones, PharmD, BCPS Clinical Pharmacist   12/12/2020 12:29 PM

## 2020-12-12 NOTE — Evaluation (Signed)
Physical Therapy Evaluation Patient Details Name: ROSALY LABARBERA MRN: [id number] DOB: 12-Feb-1946 Today's Date: 12/12/2020  History of Present Illness  Pt admitted to [facility] on 12/09/20 for c/o progressive generalized weakness and hypoxia, with sat in 80's at home on RA. Significant PMH includes: sCHF (EF 35%), CAD with stent placement, CKD (3a), cLBP, Bell's Palsy, HTN, HLD, diabetes, GERD, depression, anxiety. Elevated troponin likely due to demand ischemia and not MI. Developed tachyarrhythmia with Afib as well as worsening AKI, hypotension, and was transferred to SD/ICU for pressor support, amiodarone gtt, and nephrology consult. Spine MRI negative for spinal abscess.    Clinical Impression  Pt is a 75 year old F admitted to hospital on 12/09/20 for NSTEMI. At baseline, pt was Ind with ADL's, IADL's, medication management, and used RW vs. SPC PRN for limited community ambulation. Pt presents with mild confusion, increased O2 dependence from baseline, generalized weakness, increased pain levels, decreased activity tolerance, impaired skin integrity, impaired insight into deficits, and decreased gross balance, resulting in impaired functional mobility. Due to deficits, pt required total assist for bed mobility and max assist for STS transfer from EOB with RW. Pt unable to achieve full upright standing, and maintain standing longer than 5s. Max encouragement required throughout session for participation. Deficits limit the pt's ability to safely and independently perform ADL's, transfer, and ambulate. Pt will benefit from acute skilled PT services to address deficits for return to baseline function. At this time, PT recommends SNF at DC to address deficits and improve overall safety with functional mobility, prior to return home.        Recommendations for follow up therapy are one component of a multi-disciplinary discharge planning process, led by the attending physician.  Recommendations may be updated  based on patient status, additional functional criteria and insurance authorization.  Follow Up Recommendations SNF    Equipment Recommendations   (defer to post acute)       Precautions / Restrictions Precautions Precautions: Fall Restrictions Weight Bearing Restrictions: No      Mobility  Bed Mobility Overal bed mobility: Needs Assistance Bed Mobility: Rolling;Supine to Sit;Sit to Supine Rolling: Total assist   Supine to sit: Total assist Sit to supine: Total assist   General bed mobility comments: increased effort due to pain; max multimodal cues    Transfers Overall transfer level: Needs assistance   Transfers: Sit to/from Stand Sit to Stand: Max assist         General transfer comment: with encouragement; x2 trials; unable to achieve full upright standing < 5s  Ambulation/Gait Ambulation/Gait assistance:  (unable)     Balance Overall balance assessment: Needs assistance Sitting-balance support: Feet supported;Bilateral upper extremity supported Sitting balance-Leahy Scale: Fair     Standing balance support: During functional activity;Bilateral upper extremity supported Standing balance-Leahy Scale: Zero Standing balance comment: in RW           Pertinent Vitals/Pain Pain Assessment: Faces Faces Pain Scale: Hurts worst Pain Location: back Pain Descriptors / Indicators: Aching;Discomfort;Grimacing;Guarding Pain Intervention(s): Limited activity within patient's tolerance;Monitored during session;Premedicated before session;Repositioned    Home Living Family/patient expects to be discharged to:: Private residence Living Arrangements: Other relatives Lucila [place], [place]) Available Help at Discharge: Family;Available PRN/intermittently Type of Home: House Home Access: Stairs to enter Entrance Stairs-Rails: Doctor, general practice of Steps: 3 Home Layout: One level Home Equipment: Walker - 2 wheels;Cane - single point      Prior  Function Level of Independence: Independent  Comments: Pt reports living with grandson who works during the day and "is not very helpful". She reports being independent at baseline with mobility and self care tasks. States she uses RW vs. SPC PRN for ambulation. Ind with medication management. Doesn't drive or wear O2 at baseline.     Hand Dominance   Dominant Hand: Right    Extremity/Trunk Assessment   Upper Extremity Assessment Upper Extremity Assessment: Defer to OT evaluation    Lower Extremity Assessment Lower Extremity Assessment: Generalized weakness    Cervical / Trunk Assessment Cervical / Trunk Assessment: Normal  Communication   Communication: HOH  Cognition Arousal/Alertness: Awake/alert Behavior During Therapy: WFL for tasks assessed/performed Overall Cognitive Status: Within Functional Limits for tasks assessed          General Comments: increased time to process but A & O x4      General Comments General comments (skin integrity, edema, etc.): bruising and skin breakdown to back/buttock; impaired skin noted to bottom and sides of L foot. RN aware    Exercises Other Exercises Other Exercises: Participated in bed mobility and transfers; further mobility limited due to generalized weakness, poor balance, and pt refusal. Assist with pericare after BM in bed. Required max encouragement for participation. Other Exercises: Educated: PT role/POC, DC recommendations, participation benefits, OOB benefits, pain management. She verbalized understanding.   Assessment/Plan    PT Assessment Patient needs continued PT services  PT Problem List Decreased strength;Decreased activity tolerance;Decreased balance;Decreased mobility;Decreased cognition;Decreased safety awareness;Pain;Obesity;Decreased skin integrity       PT Treatment Interventions Gait training;Stair training;Functional mobility training;Therapeutic activities;Therapeutic exercise;Balance  training;Neuromuscular re-education    PT Goals (Current goals can be found in the Care Plan section)  Acute Rehab PT Goals Patient Stated Goal: to decrease pain PT Goal Formulation: With patient Time For Goal Achievement: 12/26/20 Potential to Achieve Goals: Fair    Frequency Min 2X/week   Barriers to discharge Decreased caregiver support         AM-PAC PT "6 Clicks" Mobility  Outcome Measure Help needed turning from your back to your side while in a flat bed without using bedrails?: Total Help needed moving from lying on your back to sitting on the side of a flat bed without using bedrails?: Total Help needed moving to and from a bed to a chair (including a wheelchair)?: Total Help needed standing up from a chair using your arms (e.g., wheelchair or bedside chair)?: A Lot Help needed to walk in hospital room?: Total Help needed climbing 3-5 steps with a railing? : Total 6 Click Score: 7    End of Session Equipment Utilized During Treatment: Gait belt;Oxygen Activity Tolerance: Patient limited by fatigue;Patient limited by pain Patient left: in bed;with call bell/phone within reach;with bed alarm set;with nursing/sitter in room Nurse Communication: Mobility status PT Visit Diagnosis: Unsteadiness on feet (R26.81);Muscle weakness (generalized) (M62.81);History of falling (Z91.81);Pain Pain - Right/Left:  (back)    Time: 0630-1601 PT Time Calculation (min) (ACUTE ONLY): 35 min   Charges:   PT Evaluation $PT Eval Moderate Complexity: 1 Mod PT Treatments $Therapeutic Activity: 8-22 mins        Vira Blanco, PT, DPT 12:59 PM,12/12/20

## 2020-12-12 NOTE — Progress Notes (Signed)
[facility]  PROGRESS NOTE   Subjective:   Patient seen at bedside in the ICU. Patient's grandson is at bedside. Urine output is minimal. Patient denies any chest pain or shortness of breath.  Objective:  Vital signs in last 24 hours:  Temp:  [97.8 F (36.6 C)-98.2 F (36.8 C)] 98.2 F (36.8 C) (09/24 1200) Pulse Rate:  [34-101] 90 (09/24 1247) Resp:  [13-22] 21 (09/24 1200) BP: (71-159)/(42-90) 125/61 (09/24 1247) SpO2:  [95 %-100 %] 95 % (09/24 1247)  Weight change:  Filed Weights   12/09/20 0354  Weight: 102.1 kg    Intake/Output: I/O last 3 completed shifts: In: 5180.9 [P.O.:150; I.V.:4781.3; IV Piggyback:249.7] Out: 0    Intake/Output this shift:  Total I/O In: 795.1 [P.O.:100; I.V.:597.3; IV Piggyback:97.9] Out: 0   Physical Exam: General:  No acute distress  Head:  Normocephalic, atraumatic. Moist oral mucosal membranes  Eyes:  Anicteric  Neck:  Supple  Lungs:   Clear to auscultation, normal effort  Heart:  S1S2 no rubs  Abdomen:   Soft, nontender, bowel sounds present  Extremities:  peripheral edema.  Neurologic:  Awake, alert, following commands  Skin:  No lesions  Access:     Basic Metabolic Panel: Recent Labs  Lab 12/09/20 0415 12/10/20 0721 12/10/20 2318 12/11/20 1815 12/12/20 1158  NA 134* 132* 130* 128* 128*  K 3.4* 3.8 3.8 3.5 2.8*  CL 100 98 100 93* 82*  CO2 22 18* 18* 20* 27  GLUCOSE 238* 113* 128* 97 105*  BUN 35* 57* 65* 70* 75*  CREATININE 2.02* 4.21* 5.17* 5.28* 5.06*  CALCIUM 8.5* 8.0* 7.3* 6.4* 6.0*  MG 1.8  --   --   --   --     CBC: Recent Labs  Lab 12/09/20 0415 12/10/20 0721 12/12/20 0355 12/12/20 1158  WBC 11.4* 13.9* 16.0* 16.0*  NEUTROABS 9.1*  --   --  14.1*  HGB 12.3 10.7* 9.8* 9.8*  HCT 37.6 33.0* 27.5* 27.1*  MCV 86.0 84.2 78.8* 77.4*  PLT 190 150 155 173     Urinalysis: Recent Labs    12/09/20 2124  COLORURINE AMBER*  LABSPEC 1.027  PHURINE 5.0  GLUCOSEU NEGATIVE  HGBUR  SMALL*  BILIRUBINUR NEGATIVE  KETONESUR NEGATIVE  PROTEINUR 100*  NITRITE NEGATIVE  LEUKOCYTESUR NEGATIVE      Imaging: MR THORACIC SPINE WO CONTRAST  Result Date: 12/11/2020 CLINICAL DATA:  Low back pain, infection suspected; Mid-back pain, neuro deficit EXAM: MRI THORACIC AND LUMBAR SPINE WITHOUT CONTRAST TECHNIQUE: Multiplanar and multiecho pulse sequences of the thoracic and lumbar spine were obtained without intravenous contrast. COMPARISON:  None. FINDINGS: MRI THORACIC SPINE FINDINGS Alignment:  No substantial sagittal subluxation. Vertebrae: Vertebral body heights are maintained. No focal marrow edema to suggest osteomyelitis or acute fracture. Cord:  Normal cord signal. Paraspinal and other soft tissues: Appreciable paraspinal edema. Hiatal hernia. Disc levels: Mild disc bulging at T7-T8 and T8-T9 without significant canal stenosis. Small broad disc bulge with central disc protrusion at T9-T10 with ligamentum flavum thickening. Resulting mild canal stenosis. Multilevel facet arthropathy, greatest in the lower thoracic spine with mild bilateral foraminal stenosis at T8-T9, T9-T10, and T10-T11. Mild foraminal stenosis on the right at T11-T12. T12-L1 levels discussed below. MRI LUMBAR SPINE FINDINGS Segmentation:  Standard. Alignment:  Grade 1 anterolisthesis L4 on L5. Vertebrae: Edema within the T12-L1, L4-L5 and L5-S1 discs. Chronic appearing degenerative endplate signal changes at T12-L1 without significant marrow edema. Conus medullaris and cauda equina: Conus extends to the  L1 level. Conus appears normal. Paraspinal and other soft tissues: Partially imaged dilated gallbladder with borderline dilated common bile duct, measuring approximately 6 mm. Left renal cysts. Disc levels: T12-L1: Degenerative disc height loss with disc edema. Posterior disc bulge with moderate bilateral facet hypertrophy. Ligamentum flavum thickening. Resulting moderate right foraminal stenosis and mild canal stenosis. No  significant left foraminal stenosis. L1-L2: Left eccentric disc bulge and mild left greater than right facet hypertrophy. Ligamentum flavum thickening. Resulting mild left foraminal stenosis without significant canal or right foraminal stenosis. L2-L3: Mild disc bulging and mild-to-moderate bilateral facet arthropathy. No significant canal or foraminal stenosis. L3-L4: Broad disc bulge with bulky ligamentum flavum thickening and moderate bilateral facet arthropathy. Resulting mild-to-moderate canal stenosis without significant foraminal stenosis. L4-L5: Right eccentric disc height loss. Grade 1 anterolisthesis of L4 on L5. Uncovering of the disc with superimposed small left subarticular disc protrusion. Moderate right greater than left facet hypertrophy. Resulting mild to moderate right subarticular and foraminal stenosis. No significant canal or left foraminal stenosis. L5-S1: Left eccentric disc height loss. Left eccentric disc bulge and endplate spurring. Moderate left mild right facet arthropathy. Resulting mild-to-moderate left foraminal stenosis with mild left subarticular recess narrowing. No significant canal or right foraminal stenosis. IMPRESSION: MR THORACIC SPINE IMPRESSION 1. Mild canal stenosis at T9-T10. Otherwise, multilevel small disc bulges without significant canal stenosis. 2. Mild multilevel foraminal stenosis in the lower thoracic spine, described above. 3. T12-L1 is discussed below. 4. Hiatal hernia. MR LUMBAR SPINE IMPRESSION 1. At T12-L1, moderate right foraminal stenosis with mild canal stenosis. Degenerative disc disease at this level with disc edema and slight left paraspinal edema, most likely degenerative/inflammatory in etiology. Infectious discitis is thought less likely but if there is clinical concern for infection then consider correlation with inflammatory markers and short interval follow-up MRI with contrast to ensure stability. 2. At L3-L4, mild-to-moderate canal stenosis. 3.  At L4-L5, right eccentric degenerative change with mild-to-moderate right subarticular recess and foraminal stenosis. 4. At L5-S1, left eccentric degenerative change with mild-to-moderate left foraminal stenosis and mild left subarticular recess narrowing. 5. Partially imaged dilated gallbladder with borderline dilated common bile duct. This could be physiologic; however, consider correlation with liver function enzymes to exclude obstruction. Right upper quadrant ultrasound could also further evaluate if clinically indicated. Electronically Signed   By: Feliberto Harts M.D.   On: 12/11/2020 12:29   MR LUMBAR SPINE WO CONTRAST  Result Date: 12/11/2020 CLINICAL DATA:  Low back pain, infection suspected; Mid-back pain, neuro deficit EXAM: MRI THORACIC AND LUMBAR SPINE WITHOUT CONTRAST TECHNIQUE: Multiplanar and multiecho pulse sequences of the thoracic and lumbar spine were obtained without intravenous contrast. COMPARISON:  None. FINDINGS: MRI THORACIC SPINE FINDINGS Alignment:  No substantial sagittal subluxation. Vertebrae: Vertebral body heights are maintained. No focal marrow edema to suggest osteomyelitis or acute fracture. Cord:  Normal cord signal. Paraspinal and other soft tissues: Appreciable paraspinal edema. Hiatal hernia. Disc levels: Mild disc bulging at T7-T8 and T8-T9 without significant canal stenosis. Small broad disc bulge with central disc protrusion at T9-T10 with ligamentum flavum thickening. Resulting mild canal stenosis. Multilevel facet arthropathy, greatest in the lower thoracic spine with mild bilateral foraminal stenosis at T8-T9, T9-T10, and T10-T11. Mild foraminal stenosis on the right at T11-T12. T12-L1 levels discussed below. MRI LUMBAR SPINE FINDINGS Segmentation:  Standard. Alignment:  Grade 1 anterolisthesis L4 on L5. Vertebrae: Edema within the T12-L1, L4-L5 and L5-S1 discs. Chronic appearing degenerative endplate signal changes at T12-L1 without significant marrow edema.  Conus medullaris  and cauda equina: Conus extends to the L1 level. Conus appears normal. Paraspinal and other soft tissues: Partially imaged dilated gallbladder with borderline dilated common bile duct, measuring approximately 6 mm. Left renal cysts. Disc levels: T12-L1: Degenerative disc height loss with disc edema. Posterior disc bulge with moderate bilateral facet hypertrophy. Ligamentum flavum thickening. Resulting moderate right foraminal stenosis and mild canal stenosis. No significant left foraminal stenosis. L1-L2: Left eccentric disc bulge and mild left greater than right facet hypertrophy. Ligamentum flavum thickening. Resulting mild left foraminal stenosis without significant canal or right foraminal stenosis. L2-L3: Mild disc bulging and mild-to-moderate bilateral facet arthropathy. No significant canal or foraminal stenosis. L3-L4: Broad disc bulge with bulky ligamentum flavum thickening and moderate bilateral facet arthropathy. Resulting mild-to-moderate canal stenosis without significant foraminal stenosis. L4-L5: Right eccentric disc height loss. Grade 1 anterolisthesis of L4 on L5. Uncovering of the disc with superimposed small left subarticular disc protrusion. Moderate right greater than left facet hypertrophy. Resulting mild to moderate right subarticular and foraminal stenosis. No significant canal or left foraminal stenosis. L5-S1: Left eccentric disc height loss. Left eccentric disc bulge and endplate spurring. Moderate left mild right facet arthropathy. Resulting mild-to-moderate left foraminal stenosis with mild left subarticular recess narrowing. No significant canal or right foraminal stenosis. IMPRESSION: MR THORACIC SPINE IMPRESSION 1. Mild canal stenosis at T9-T10. Otherwise, multilevel small disc bulges without significant canal stenosis. 2. Mild multilevel foraminal stenosis in the lower thoracic spine, described above. 3. T12-L1 is discussed below. 4. Hiatal hernia. MR LUMBAR SPINE  IMPRESSION 1. At T12-L1, moderate right foraminal stenosis with mild canal stenosis. Degenerative disc disease at this level with disc edema and slight left paraspinal edema, most likely degenerative/inflammatory in etiology. Infectious discitis is thought less likely but if there is clinical concern for infection then consider correlation with inflammatory markers and short interval follow-up MRI with contrast to ensure stability. 2. At L3-L4, mild-to-moderate canal stenosis. 3. At L4-L5, right eccentric degenerative change with mild-to-moderate right subarticular recess and foraminal stenosis. 4. At L5-S1, left eccentric degenerative change with mild-to-moderate left foraminal stenosis and mild left subarticular recess narrowing. 5. Partially imaged dilated gallbladder with borderline dilated common bile duct. This could be physiologic; however, consider correlation with liver function enzymes to exclude obstruction. Right upper quadrant ultrasound could also further evaluate if clinically indicated. Electronically Signed   By: Feliberto Harts M.D.   On: 12/11/2020 12:29   DG Chest Port 1 View  Result Date: 12/11/2020 CLINICAL DATA:  75 year old female with history of acute respiratory failure with hypoxemia. EXAM: PORTABLE CHEST 1 VIEW COMPARISON:  Chest x-ray 12/09/2020. FINDINGS: Lung volumes are normal. No consolidative airspace disease. Linear opacities of the left lung base, similar to the prior study, favored to reflect subsegmental atelectasis or scarring. Possible small left pleural effusion. No right pleural effusion. No pneumothorax. No evidence of pulmonary edema. Mild cardiomegaly. Aortic atherosclerosis. IMPRESSION: 1. Possible small left pleural effusion with subsegmental atelectasis in the left lower lobe. 2. Cardiomegaly. 3. Aortic atherosclerosis. Electronically Signed   By: Trudie Reed M.D.   On: 12/11/2020 07:52   ECHOCARDIOGRAM COMPLETE  Result Date: 12/11/2020    ECHOCARDIOGRAM  REPORT   Patient Name:   Jamie Benitez Date of Exam: 12/11/2020 Medical Rec #:  [id number]       Height:       60.0 in Accession #:    [id number]      Weight:       225.0 lb Date of Birth:  Dec 11, 1945  BSA:          1.962 m Patient Age:    75 years        BP:           97/73 mmHg Patient Gender: F               HR:           102 bpm. Exam Location:  [facility] Procedure: 2D Echo, Cardiac Doppler and Color Doppler Indications:     Bacteremia R78.81  History:         Patient has no prior history of Echocardiogram examinations.                  CHF, Previous Myocardial Infarction; Risk Factors:Hypertension                  and Diabetes.  Sonographer:     Cristela Blue Referring Phys:  [id number] Lynn Ito Diagnosing Phys: Lorine Bears MD  Sonographer Comments: Suboptimal apical window. IMPRESSIONS  1. Left ventricular ejection fraction, by estimation, is 25 to 30%. The left ventricle has severely decreased function. The left ventricle demonstrates regional wall motion abnormalities (see scoring diagram/findings for description). The left ventricular internal cavity size was mildly dilated. There is moderate left ventricular hypertrophy. Left ventricular diastolic parameters are indeterminate. There is akinesis of the left ventricular, entire inferior wall and inferolateral wall.  2. Right ventricular systolic function is normal. The right ventricular size is normal. Tricuspid regurgitation signal is inadequate for assessing PA pressure.  3. Left atrial size was moderately dilated.  4. Right atrial size was mildly dilated.  5. The mitral valve is normal in structure. Moderate mitral valve regurgitation. No evidence of mitral stenosis.  6. The aortic valve is normal in structure. Aortic valve regurgitation is not visualized. Mild to moderate aortic valve sclerosis/calcification is present, without any evidence of aortic stenosis. Conclusion(s)/Recommendation(s): No evidence of valvular vegetations on this  transthoracic echocardiogram. FINDINGS  Left Ventricle: Left ventricular ejection fraction, by estimation, is 25 to 30%. The left ventricle has severely decreased function. The left ventricle demonstrates regional wall motion abnormalities. The left ventricular internal cavity size was mildly  dilated. There is moderate left ventricular hypertrophy. Left ventricular diastolic parameters are indeterminate. Right Ventricle: The right ventricular size is normal. No increase in right ventricular wall thickness. Right ventricular systolic function is normal. Tricuspid regurgitation signal is inadequate for assessing PA pressure. Left Atrium: Left atrial size was moderately dilated. Right Atrium: Right atrial size was mildly dilated. Pericardium: There is no evidence of pericardial effusion. Mitral Valve: The mitral valve is normal in structure. Moderate mitral valve regurgitation. No evidence of mitral valve stenosis. Tricuspid Valve: The tricuspid valve is normal in structure. Tricuspid valve regurgitation is not demonstrated. No evidence of tricuspid stenosis. Aortic Valve: The aortic valve is normal in structure. Aortic valve regurgitation is not visualized. Mild to moderate aortic valve sclerosis/calcification is present, without any evidence of aortic stenosis. Aortic valve mean gradient measures 2.5 mmHg. Aortic valve peak gradient measures 4.5 mmHg. Aortic valve area, by VTI measures 2.89 cm. Pulmonic Valve: The pulmonic valve was normal in structure. Pulmonic valve regurgitation is not visualized. No evidence of pulmonic stenosis. Aorta: The aortic root is normal in size and structure. Venous: The inferior vena cava was not well visualized. IAS/Shunts: No atrial level shunt detected by color flow Doppler.  LEFT VENTRICLE PLAX 2D LVIDd:         5.50 cm LVIDs:  4.60 cm LV PW:         1.40 cm LV IVS:        1.30 cm LVOT diam:     2.00 cm LV SV:         38 LV SV Index:   20 LVOT Area:     3.14 cm  RIGHT  VENTRICLE RV Basal diam:  3.90 cm RV S prime:     11.60 cm/s TAPSE (M-mode): 3.6 cm LEFT ATRIUM             Index       RIGHT ATRIUM           Index LA diam:        5.10 cm 2.60 cm/m  RA Area:     21.20 cm LA Vol (A2C):   57.9 ml 29.50 ml/m RA Volume:   64.50 ml  32.87 ml/m LA Vol (A4C):   70.9 ml 36.13 ml/m LA Biplane Vol: 71.0 ml 36.18 ml/m  AORTIC VALVE                   PULMONIC VALVE AV Area (Vmax):    2.82 cm    PV Vmax:        0.80 m/s AV Area (Vmean):   2.90 cm    PV Peak grad:   2.6 mmHg AV Area (VTI):     2.89 cm    RVOT Peak grad: 4 mmHg AV Vmax:           105.50 cm/s AV Vmean:          76.700 cm/s AV VTI:            0.132 m AV Peak Grad:      4.5 mmHg AV Mean Grad:      2.5 mmHg LVOT Vmax:         94.60 cm/s LVOT Vmean:        70.700 cm/s LVOT VTI:          0.122 m LVOT/AV VTI ratio: 0.92  AORTA Ao Root diam: 2.70 cm MITRAL VALVE               TRICUSPID VALVE MV Area (PHT): 4.80 cm    TR Peak grad:   9.2 mmHg MV Decel Time: 158 msec    TR Vmax:        152.00 cm/s MV E velocity: 66.80 cm/s                            SHUNTS                            Systemic VTI:  0.12 m                            Systemic Diam: 2.00 cm Lorine Bears MD Electronically signed by Lorine Bears MD Signature Date/Time: 12/11/2020/4:17:31 PM    Final      Medications:    amiodarone 30 mg/hr (12/12/20 1100)    ceFAZolin (ANCEF) IV 2 g (12/12/20 0830)   heparin 1,300 Units/hr (12/12/20 1100)    sodium bicarbonate (isotonic) infusion in sterile water 150 mL/hr at 12/12/20 1100    atorvastatin  80 mg Oral Daily   buPROPion  150 mg Oral Daily   Chlorhexidine Gluconate Cloth  6 each Topical Daily   clopidogrel  75 mg Oral Daily   hydrOXYzine  50 mg Oral Daily   insulin aspart  0-5 Units Subcutaneous QHS   insulin aspart  0-9 Units Subcutaneous TID WC   lidocaine  1 patch Transdermal Q24H   midodrine  10 mg Oral TID WC   pantoprazole  40 mg Oral Daily   traZODone  50 mg Oral QHS    Assessment/ Plan:      Principal Problem:   NSTEMI (non-ST elevated myocardial infarction) (HCC) Active Problems:   Chronic systolic CHF (congestive heart failure) (HCC)   Chronic back pain   Acute renal failure superimposed on stage 3a chronic kidney disease (HCC)   Depression with anxiety   Type II diabetes mellitus with renal manifestations (HCC)   CAD (coronary artery disease)   HLD (hyperlipidemia)   HTN (hypertension)   Hypokalemia   Leukocytosis   Acute decompensated heart failure (HCC)  75 year old white female with a history of hypertension, coronary artery disease, congestive heart failure, diabetes, hyperlipidemia, chronic kidney disease now admitted with history of generalized weakness.  She is found to have Staphylococcus sepsis.  She also has acute kidney injury on the top of chronic kidney disease.  #1: Acute kidney injury: Patient with acute kidney injury on the top of chronic kidney disease.  She has CKD stage IIIb with a GFR of 44 cc/min previously.  Presently the creatinine has gone up to 5.0 with a GFR of less than 15 cc/min.  #2: Hypokalemia: The hypokalemia is most likely secondary to sodium bicarbonate infusion.  We will discontinue the same and give her 2 doses of 40 mEq of potassium chloride.  #3: Metabolic acidosis: This is now resolved and will discontinue the sodium bicarbonate and IV fluids.  #4: Dehydration/hyponatremia: We will give 500 cc of normal saline bolus and do a bladder scan to check the urine output.  We will also continue the IV fluids with isotonic saline at 75 cc an hour.  #5: Anemia: Anemia is most likely due to chronic kidney disease.  I spoke with family at bedside in detail and I will continue to give recommendations during the hospitalization as required.   LOS: 2 Lorain Childes, MD [facility] 9/24/202212:58 PM

## 2020-12-12 NOTE — Progress Notes (Signed)
   12/10/20 1822  Assess: MEWS Score  Temp 98.6 F (37 C)  BP (!) 85/59  Pulse Rate (!) 57  Resp 13  Level of Consciousness Alert  SpO2 (!) 88 %  O2 Device Nasal Cannula  O2 Flow Rate (L/min) 2 L/min  Assess: MEWS Score  MEWS Temp 0  MEWS Systolic 1  MEWS Pulse 0  MEWS RR 1  MEWS LOC 0  MEWS Score 2  MEWS Score Color Yellow  Assess: if the MEWS score is Yellow or Red  Were vital signs taken at a resting state? Yes  Focused Assessment No change from prior assessment  Does the patient meet 2 or more of the SIRS criteria? Yes  Does the patient have a confirmed or suspected source of infection? Yes  MEWS guidelines implemented *See Row Information* Yes  Treat  MEWS Interventions Escalated (See documentation below)  Pain Scale 0-10  Pain Score 10  Pain Type Chronic pain  Pain Location Back  Pain Orientation Lower  Pain Radiating Towards up back, including hips, neck and shoulders  Pain Descriptors / Indicators Sharp  Pain Frequency Constant  Pain Onset On-going  Take Vital Signs  Increase Vital Sign Frequency  Red: Q 1hr X 4 then Q 4hr X 4, if remains red, continue Q 4hrs (Patient's vital signs changed back and forth from red to yellow.  Will increase vital signs to red mews protocol)  Escalate  MEWS: Escalate Red: discuss with charge nurse/RN and provider, consider discussing with RRT  Notify: Charge Nurse/RN  Name of Charge Nurse/RN Notified Jessica C. Rn  Date Charge Nurse/RN Notified 12/11/20  Time Charge Nurse/RN Notified 1830  Notify: Provider  Provider Name/Title Dr. Georgeann Oppenheim  Date Provider Notified 12/10/20  Time Provider Notified [phone]  Notification Type Page (secure chat)  Notification Reason Change in status  Provider response See new orders  Date of Provider Response 12/10/20  Time of Provider Response 1640  Notify: Rapid Response  Date Rapid Response Notified 12/10/20  Time Rapid Response Notified 1845  Document  Patient Outcome Not stable and remains  on department  Progress note created (see row info) Yes  Assess: SIRS CRITERIA  SIRS Temperature  0  SIRS Pulse 0  SIRS Respirations  0  SIRS WBC 0  SIRS Score Sum  0  Inserted for [employer] RN

## 2020-12-13 ENCOUNTER — Inpatient Hospital Stay: Payer: Medicare Other

## 2020-12-13 DIAGNOSIS — R569 Unspecified convulsions: Secondary | ICD-10-CM | POA: Diagnosis not present

## 2020-12-13 DIAGNOSIS — N179 Acute kidney failure, unspecified: Secondary | ICD-10-CM | POA: Diagnosis not present

## 2020-12-13 DIAGNOSIS — L8990 Pressure ulcer of unspecified site, unspecified stage: Secondary | ICD-10-CM | POA: Diagnosis present

## 2020-12-13 DIAGNOSIS — N1831 Chronic kidney disease, stage 3a: Secondary | ICD-10-CM | POA: Diagnosis not present

## 2020-12-13 DIAGNOSIS — I214 Non-ST elevation (NSTEMI) myocardial infarction: Secondary | ICD-10-CM | POA: Diagnosis not present

## 2020-12-13 LAB — CBC
HCT: 28.6 % — ABNORMAL LOW (ref 36.0–46.0)
Hemoglobin: 10 g/dL — ABNORMAL LOW (ref 12.0–15.0)
MCH: 27.9 pg (ref 26.0–34.0)
MCHC: 35 g/dL (ref 30.0–36.0)
MCV: 79.9 fL — ABNORMAL LOW (ref 80.0–100.0)
Platelets: 155 10*3/uL (ref 150–400)
RBC: 3.58 MIL/uL — ABNORMAL LOW (ref 3.87–5.11)
RDW: 15.9 % — ABNORMAL HIGH (ref 11.5–15.5)
WBC: 17.6 10*3/uL — ABNORMAL HIGH (ref 4.0–10.5)
nRBC: 0 % (ref 0.0–0.2)

## 2020-12-13 LAB — BASIC METABOLIC PANEL
Anion gap: 23 — ABNORMAL HIGH (ref 5–15)
BUN: 80 mg/dL — ABNORMAL HIGH (ref 8–23)
CO2: 19 mmol/L — ABNORMAL LOW (ref 22–32)
Calcium: 6.3 mg/dL — CL (ref 8.9–10.3)
Chloride: 86 mmol/L — ABNORMAL LOW (ref 98–111)
Creatinine, Ser: 6.07 mg/dL — ABNORMAL HIGH (ref 0.44–1.00)
GFR, Estimated: 7 mL/min — ABNORMAL LOW (ref >60)
Glucose, Bld: 143 mg/dL — ABNORMAL HIGH (ref 70–99)
Potassium: 3.7 mmol/L (ref 3.5–5.1)
Sodium: 128 mmol/L — ABNORMAL LOW (ref 135–145)

## 2020-12-13 LAB — BLOOD GAS, ARTERIAL
Acid-base deficit: 0.8 mmol/L (ref 0.0–2.0)
Bicarbonate: 24.3 mmol/L (ref 20.0–28.0)
FIO2: 0.28
O2 Saturation: 88.1 %
Patient temperature: 37
pCO2 arterial: 41 mmHg (ref 32.0–48.0)
pH, Arterial: 7.38 (ref 7.350–7.450)
pO2, Arterial: 56 mmHg — ABNORMAL LOW (ref 83.0–108.0)

## 2020-12-13 LAB — GLUCOSE, CAPILLARY
Glucose-Capillary: 136 mg/dL — ABNORMAL HIGH (ref 70–99)
Glucose-Capillary: 140 mg/dL — ABNORMAL HIGH (ref 70–99)
Glucose-Capillary: 78 mg/dL (ref 70–99)
Glucose-Capillary: 70 mg/dL (ref 70–99)
Glucose-Capillary: 118 mg/dL — ABNORMAL HIGH (ref 70–99)

## 2020-12-13 LAB — HEPARIN LEVEL (UNFRACTIONATED): Heparin Unfractionated: 0.8 IU/mL — ABNORMAL HIGH (ref 0.30–0.70)

## 2020-12-13 IMAGING — CT CT HEAD W/O CM
3 of 6 series · 15 of 47 positions shown, 18 images · non-contrast
Comparison: Head CT [DATE].

CLINICAL DATA: 75-year-old female with new onset drooling, altered
mental status.

EXAM:
CT HEAD WITHOUT CONTRAST
TECHNIQUE: Contiguous axial images were obtained from the base of the skull
through the vertex without intravenous contrast.

[Series 2: head wo · axial · 0.42mm/px · z∈[-115,+15]mm · 10 of 32 slices shown, 13 images]
[im 3/32  brain]
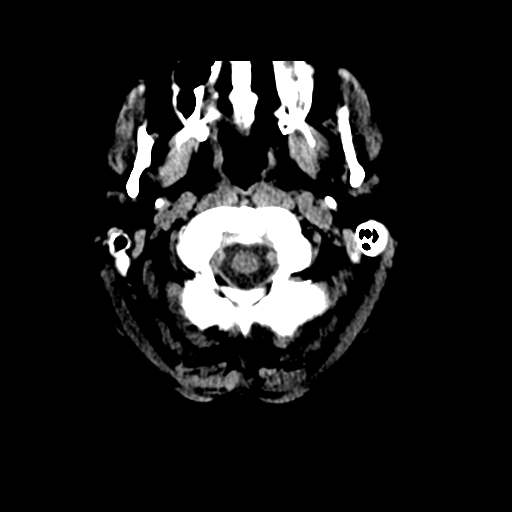
[im 3/32  bone]
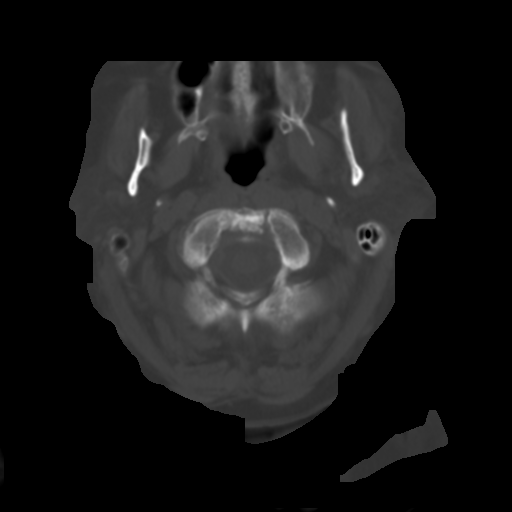
[im 5/32  brain]
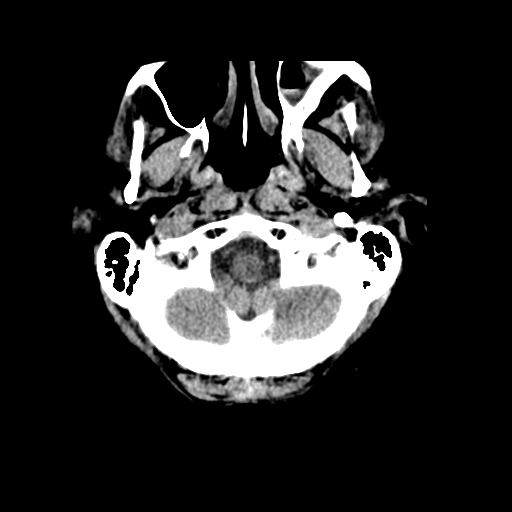
[im 9/32  brain]
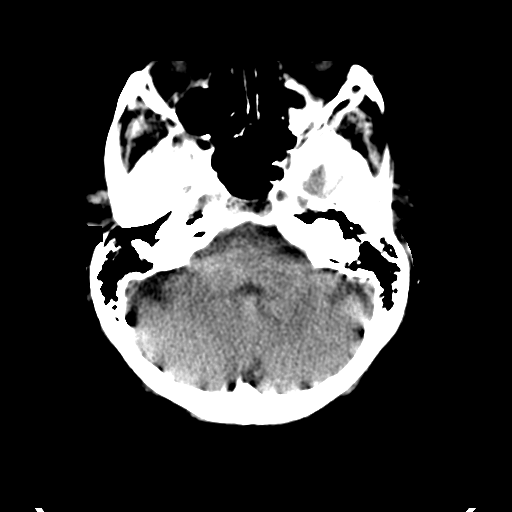
[im 12/32  brain]
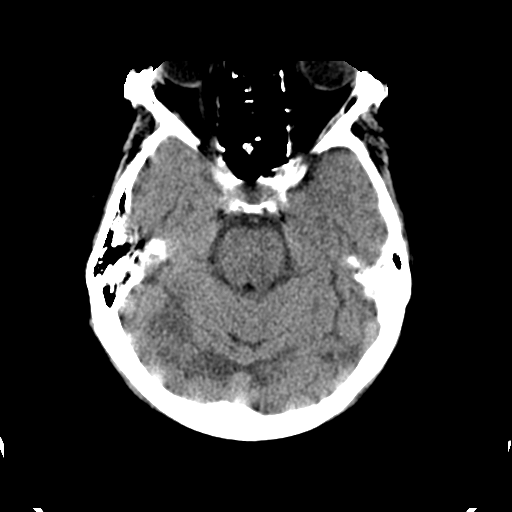
[im 14/32  brain]
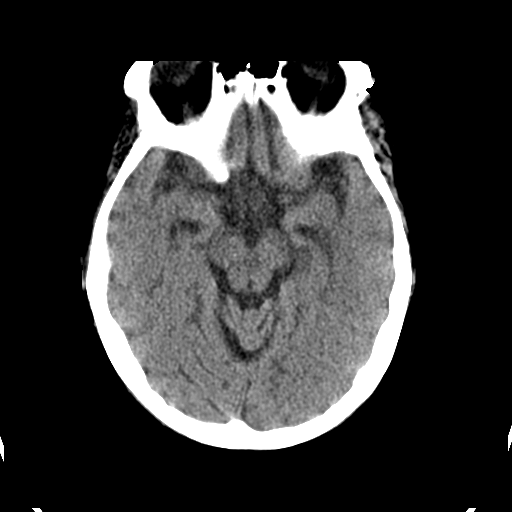
[im 14/32  bone]
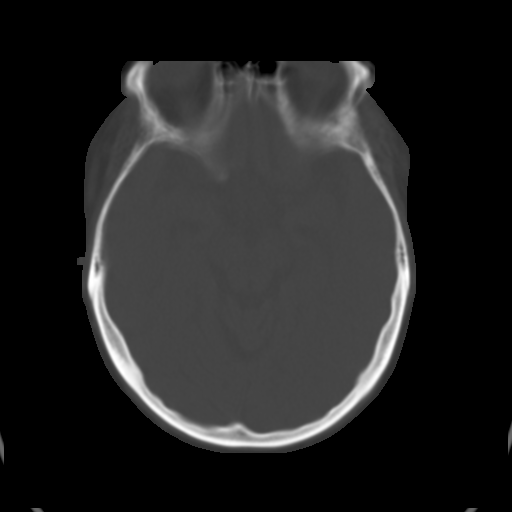
[im 18/32  brain]
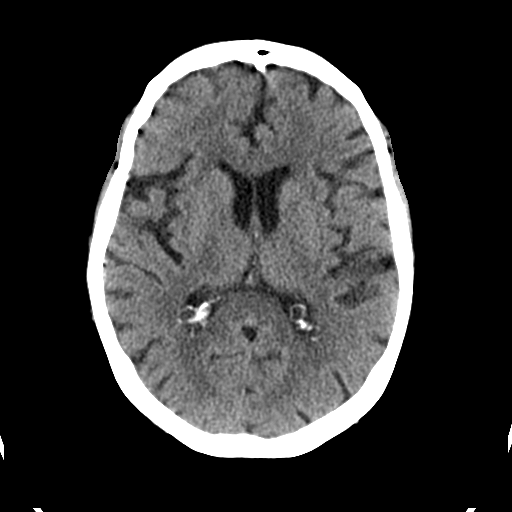
[im 20/32  brain]
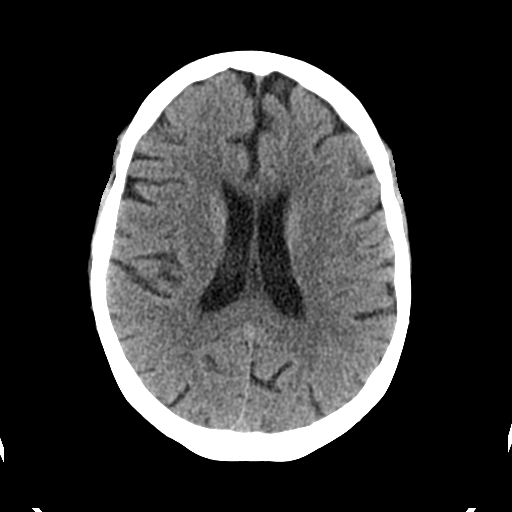
[im 23/32  brain]
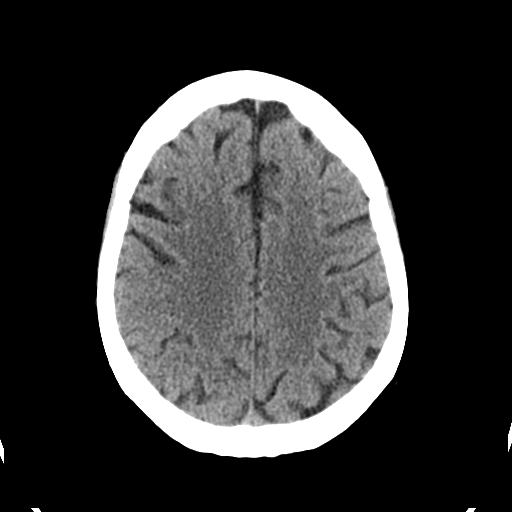
[im 27/32  brain]
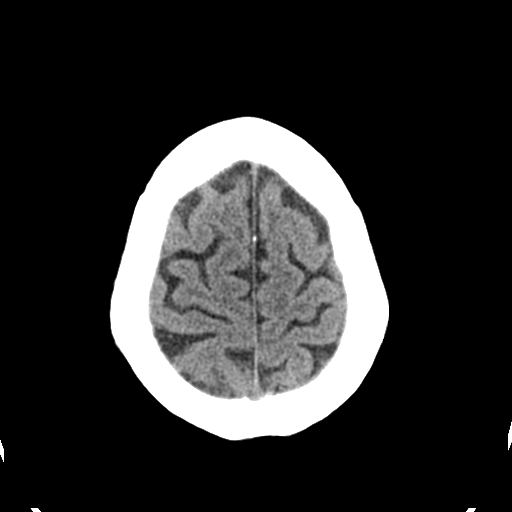
[im 27/32  bone]
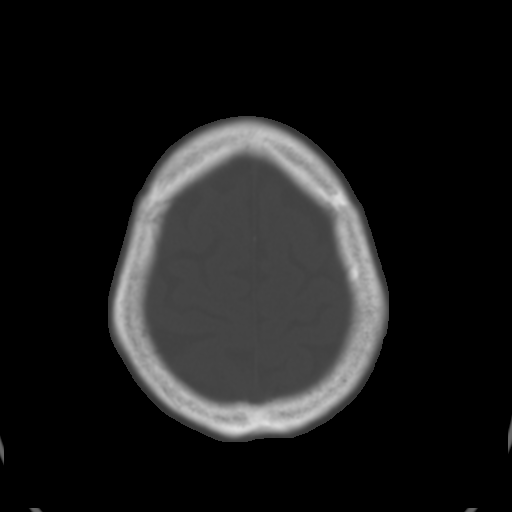
[im 29/32  brain]
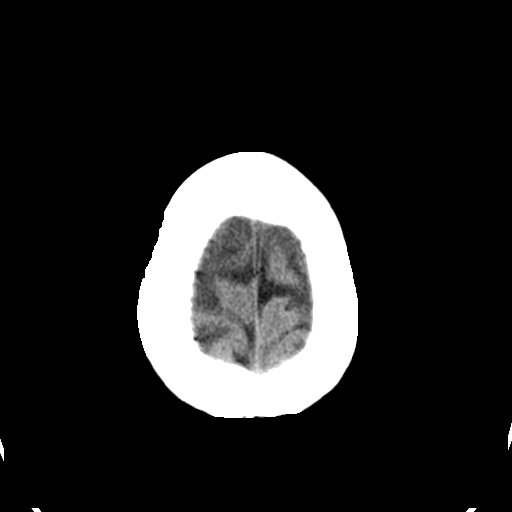

[Series 4: coronal soft tissue · coronal · 0.31mm/px · 3 of 75 slices shown]
[im 19/75  brain]
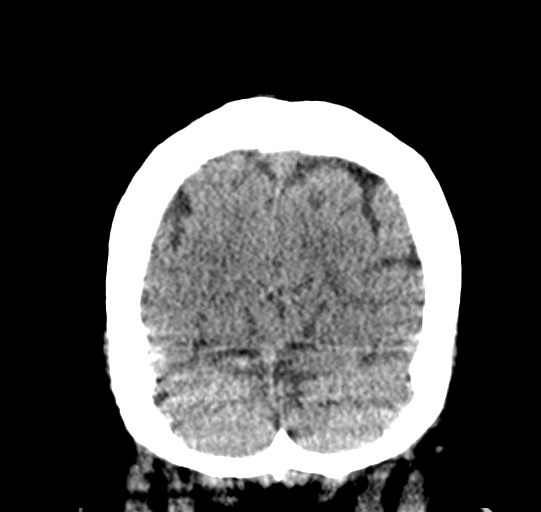
[im 38/75  brain]
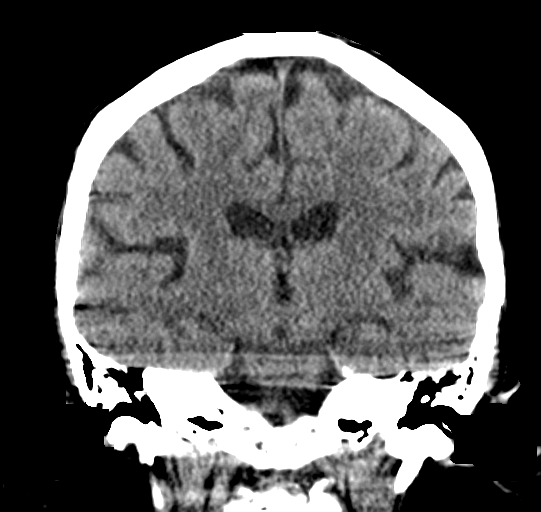
[im 56/75  brain]
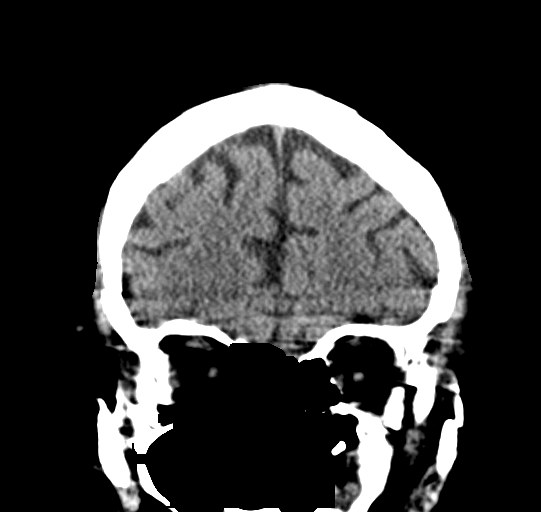

[Series 9: sagittal soft tissue · sagittal · 0.31mm/px · 2 of 56 slices shown]
[im 19/56  brain]
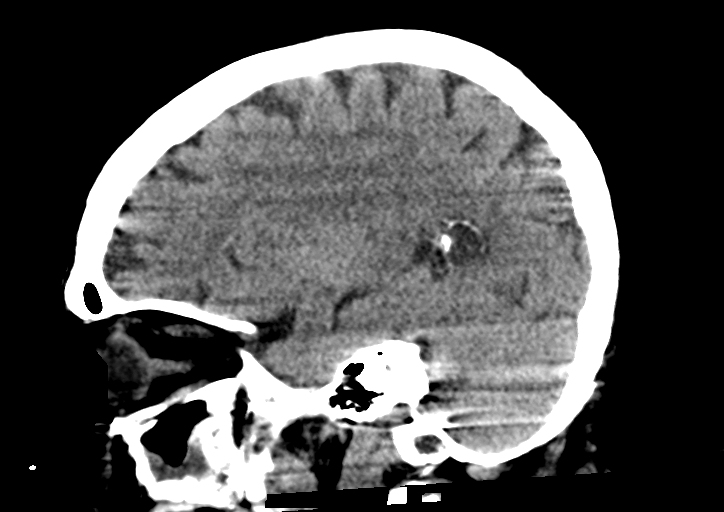
[im 37/56  brain]
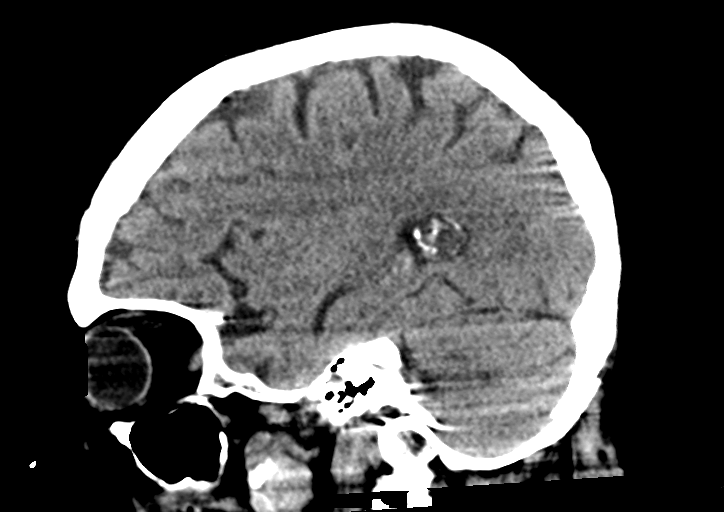

[15 of 47 positions shown; findings below may reference images not displayed]

FINDINGS: Study is mildly degraded by motion artifact despite repeated imaging
attempts.

Brain: Cerebral volume is not significantly changed since [U3] and
is within normal limits for age. No midline shift, ventriculomegaly,
mass effect, evidence of mass lesion, intracranial hemorrhage or
evidence of cortically based acute infarction. Allowing for mild
motion gray-white matter differentiation is within normal limits for
age throughout the brain.

Vascular: Mild Calcified atherosclerosis at the skull base. No
suspicious intracranial vascular hyperdensity.

Skull: No acute osseous abnormality identified. Chronic anterior
C1-C2 degeneration.

Sinuses/Orbits: Chronic left maxillary sinusitis with mild
improvement since [U3]. Other Visualized paranasal sinuses and
mastoids are clear.

Other: No acute orbit or scalp soft tissue finding.
IMPRESSION: 1. Normal for age non contrast CT appearance of the brain when
allowing for mildly motion degraded study.
2. Chronic left maxillary sinusitis.

## 2020-12-13 IMAGING — MR MR HEAD W/O CM
8 of 12 series · 29 of 48 positions shown · non-contrast
Comparison: Prior head CT examinations [DATE] and earlier.

CLINICAL DATA: Seizure, abnormal neuro exam.

EXAM:
MRI HEAD WITHOUT CONTRAST
TECHNIQUE: Multiplanar, multiecho pulse sequences of the brain and surrounding
structures were obtained without intravenous contrast.

[Series 5: T1 · sagittal · 5.0mm · 0.47mm/px · 1 of 22 slices shown (1 of 2)]
[im 1/22]
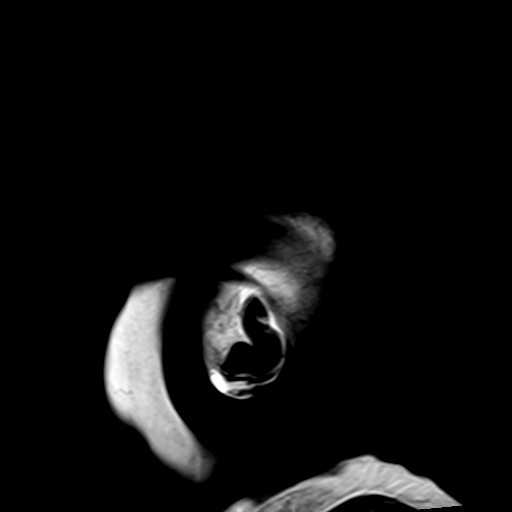

[Series 6: ax dwi_tracew · axial · 3.0mm · 0.65mm/px · z∈[-81,+71]mm · 4 of 48 slices shown]
[im 1/48]
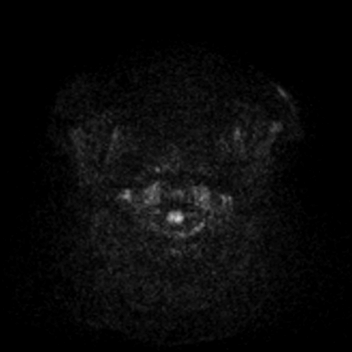
[im 16/48]
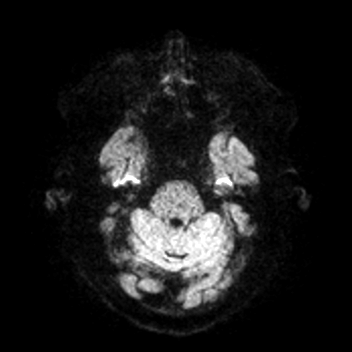
[im 32/48]
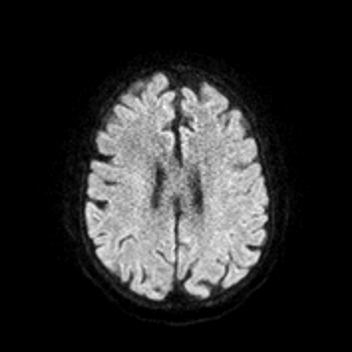
[im 48/48]
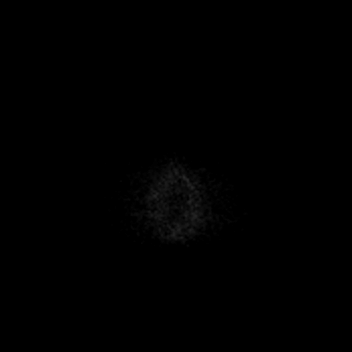

[Series 7: ax dwi_adc · axial · 3.0mm · 0.65mm/px · z∈[-81,+65]mm · 4 of 46 slices shown]
[im 1/46]
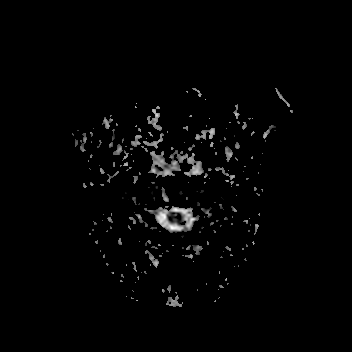
[im 16/46]
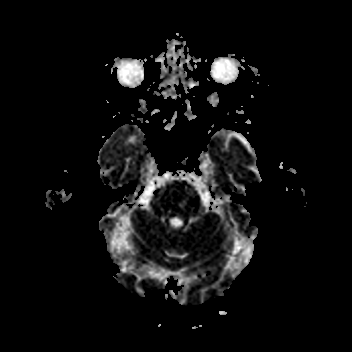
[im 31/46]
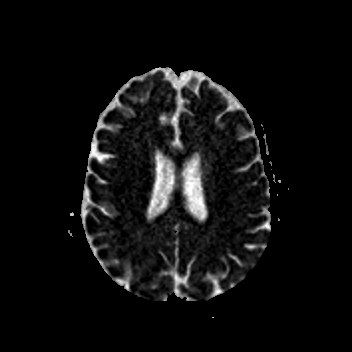
[im 46/46]
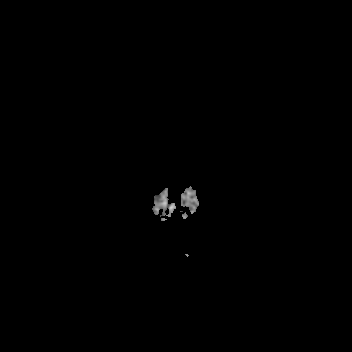

[Series 10: T2 · axial · 5.0mm · 0.86mm/px · z∈[-82,+71]mm · 2 of 27 slices shown (1 of 2)]
[im 1/27]
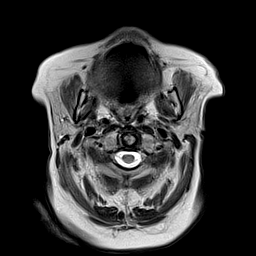
[im 27/27]
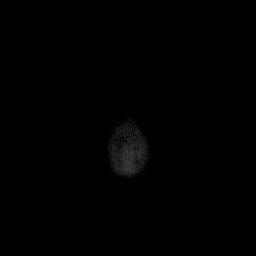

[Series 15: FLAIR · axial · 3.0mm · 0.69mm/px · z∈[-85,+74]mm · 4 of 55 slices shown (1 of 2)]
[im 1/55]
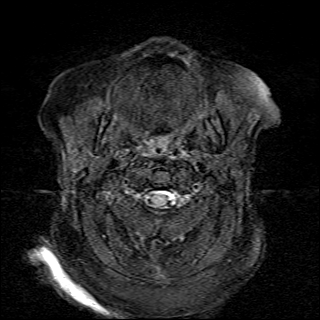
[im 19/55]
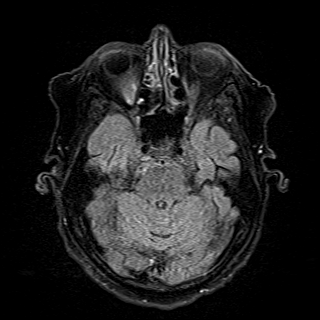
[im 37/55]
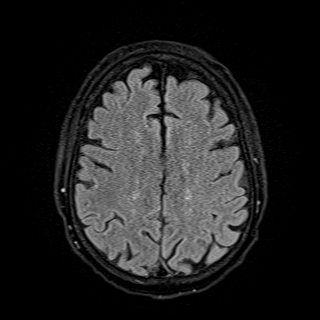
[im 55/55]
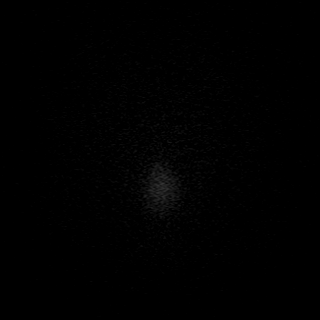

[Series 16: T1 · axial · 1.0mm · 0.98mm/px · z∈[-81,+75]mm · 8 of 160 slices shown (2 of 2)]
[im 1/160]
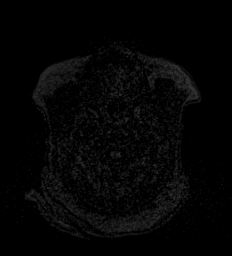
[im 27/160]
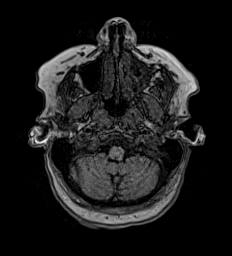
[im 54/160]
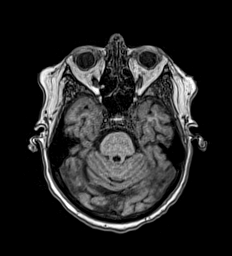
[im 67/160]
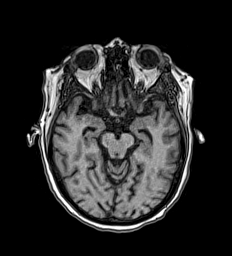
[im 93/160]
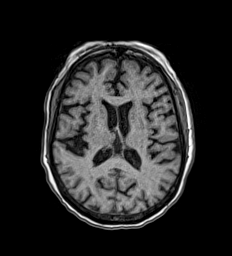
[im 107/160]
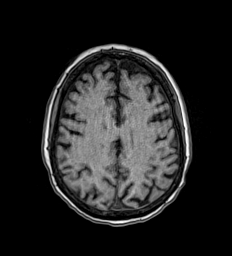
[im 133/160]
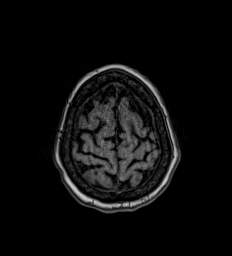
[im 160/160]
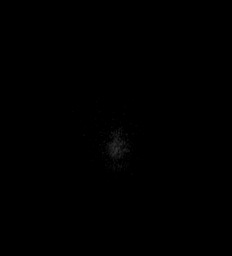

[Series 17: T2 · coronal · 3.0mm · 0.23mm/px · 3 of 35 slices shown (2 of 2)]
[im 1/35]
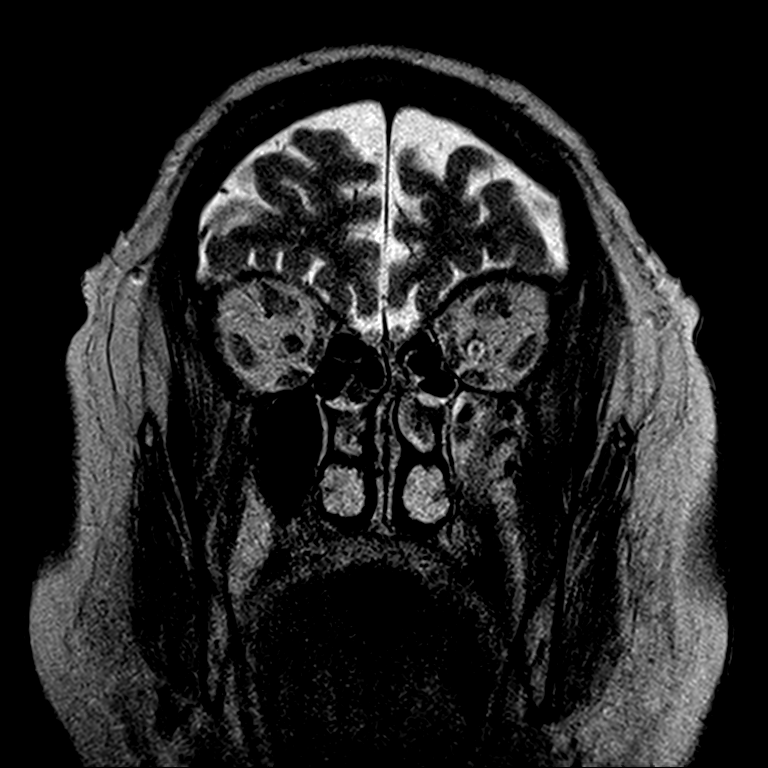
[im 18/35]
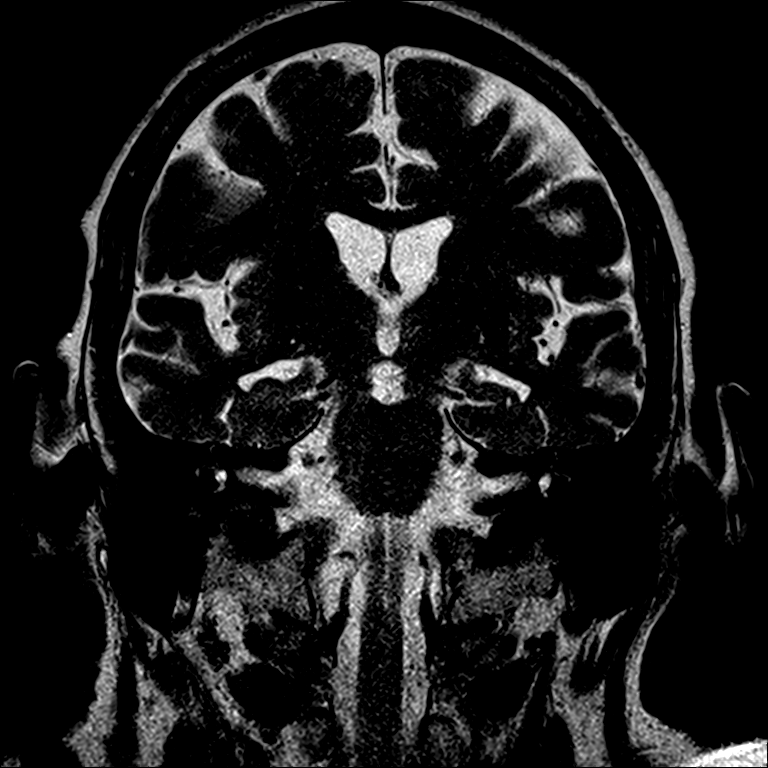
[im 35/35]
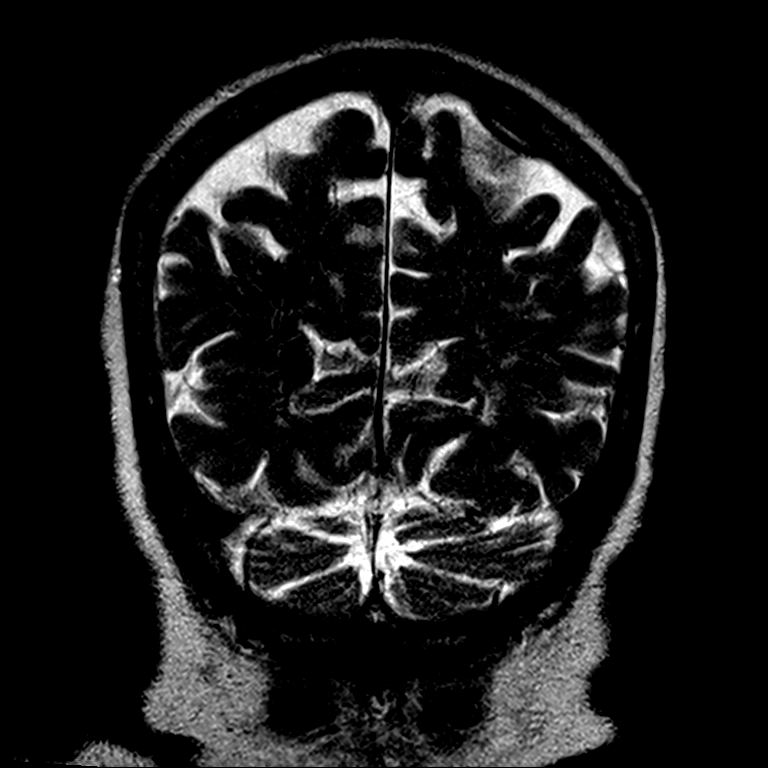

[Series 18: FLAIR · coronal · 3.0mm · 0.35mm/px · 3 of 35 slices shown (2 of 2)]
[im 1/35]
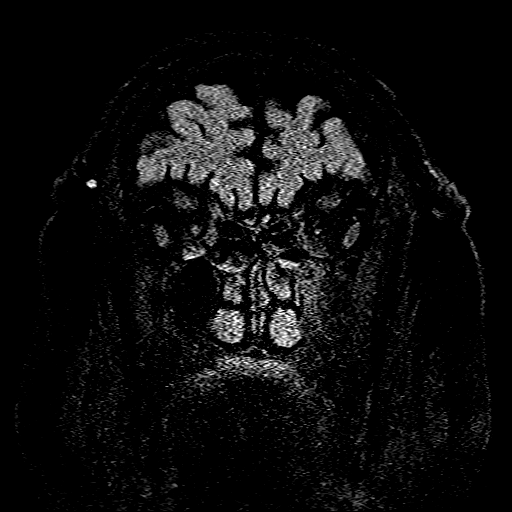
[im 18/35]
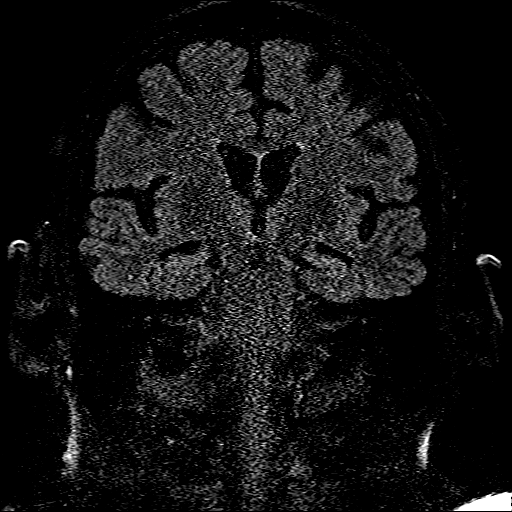
[im 35/35]
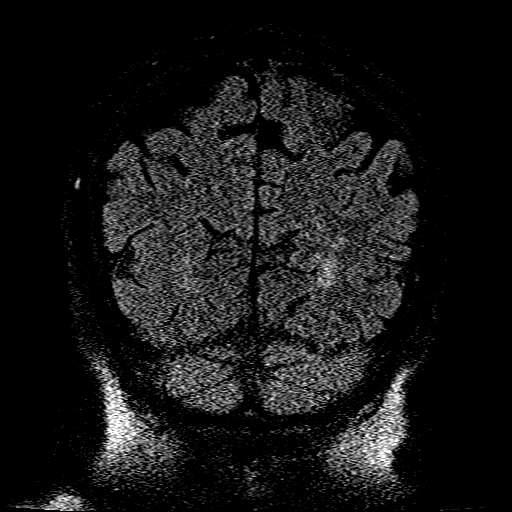

[29 of 48 positions shown; findings below may reference images not displayed]

FINDINGS: Brain:

Mild intermittent motion degradation.

Mild generalized cerebral and cerebellar atrophy.

8 mm acute infarct within the left parietal white matter (series 6,
image 27).

Additional suspected punctate acute infarct within the left parietal
cortex.

Mild multifocal T2/FLAIR hyperintense signal abnormality within the
cerebral white matter, nonspecific but compatible with chronic small
vessel ischemic disease.

Small chronic lacunar infarct within the right thalamus.

Small chronic lacunar infarcts within the right cerebellar
hemisphere.

The hippocampi are symmetric in size and signal.

No evidence of an intracranial mass.

No chronic intracranial blood products.

No extra-axial fluid collection.

No midline shift.

Vascular: Maintained flow voids within the proximal large arterial
vessels.

Skull and upper cervical spine: No focal suspicious marrow lesion.

Sinuses/Orbits: Left optic nerve atrophy. Right lens replacement.
Moderate mucosal thickening within an asymmetrically diminutive left
maxillary sinus with associated chronic reactive osteitis. Trace
mucosal thickening within the bilateral ethmoid air cells.
IMPRESSION: Mildly motion degraded exam.

8 mm acute infarct within the left parietal white matter.

Additional suspected punctate acute infarct within the left parietal
cortex.

Background mild chronic small-vessel ischemic changes within the
cerebral white matter.

Small chronic lacunar infarcts within the left thalamus and right
cerebellar hemisphere.

Mild generalized parenchymal atrophy.

Left optic nerve atrophy.

Paranasal sinus disease, most notably moderate left maxillary
sinusitis.

## 2020-12-13 IMAGING — DX DG CHEST 1V PORT
1 series · 1 of 1 positions shown · non-contrast
Comparison: [DATE]

CLINICAL DATA: PICC line

EXAM:
PORTABLE CHEST 1 VIEW

[chest ap]
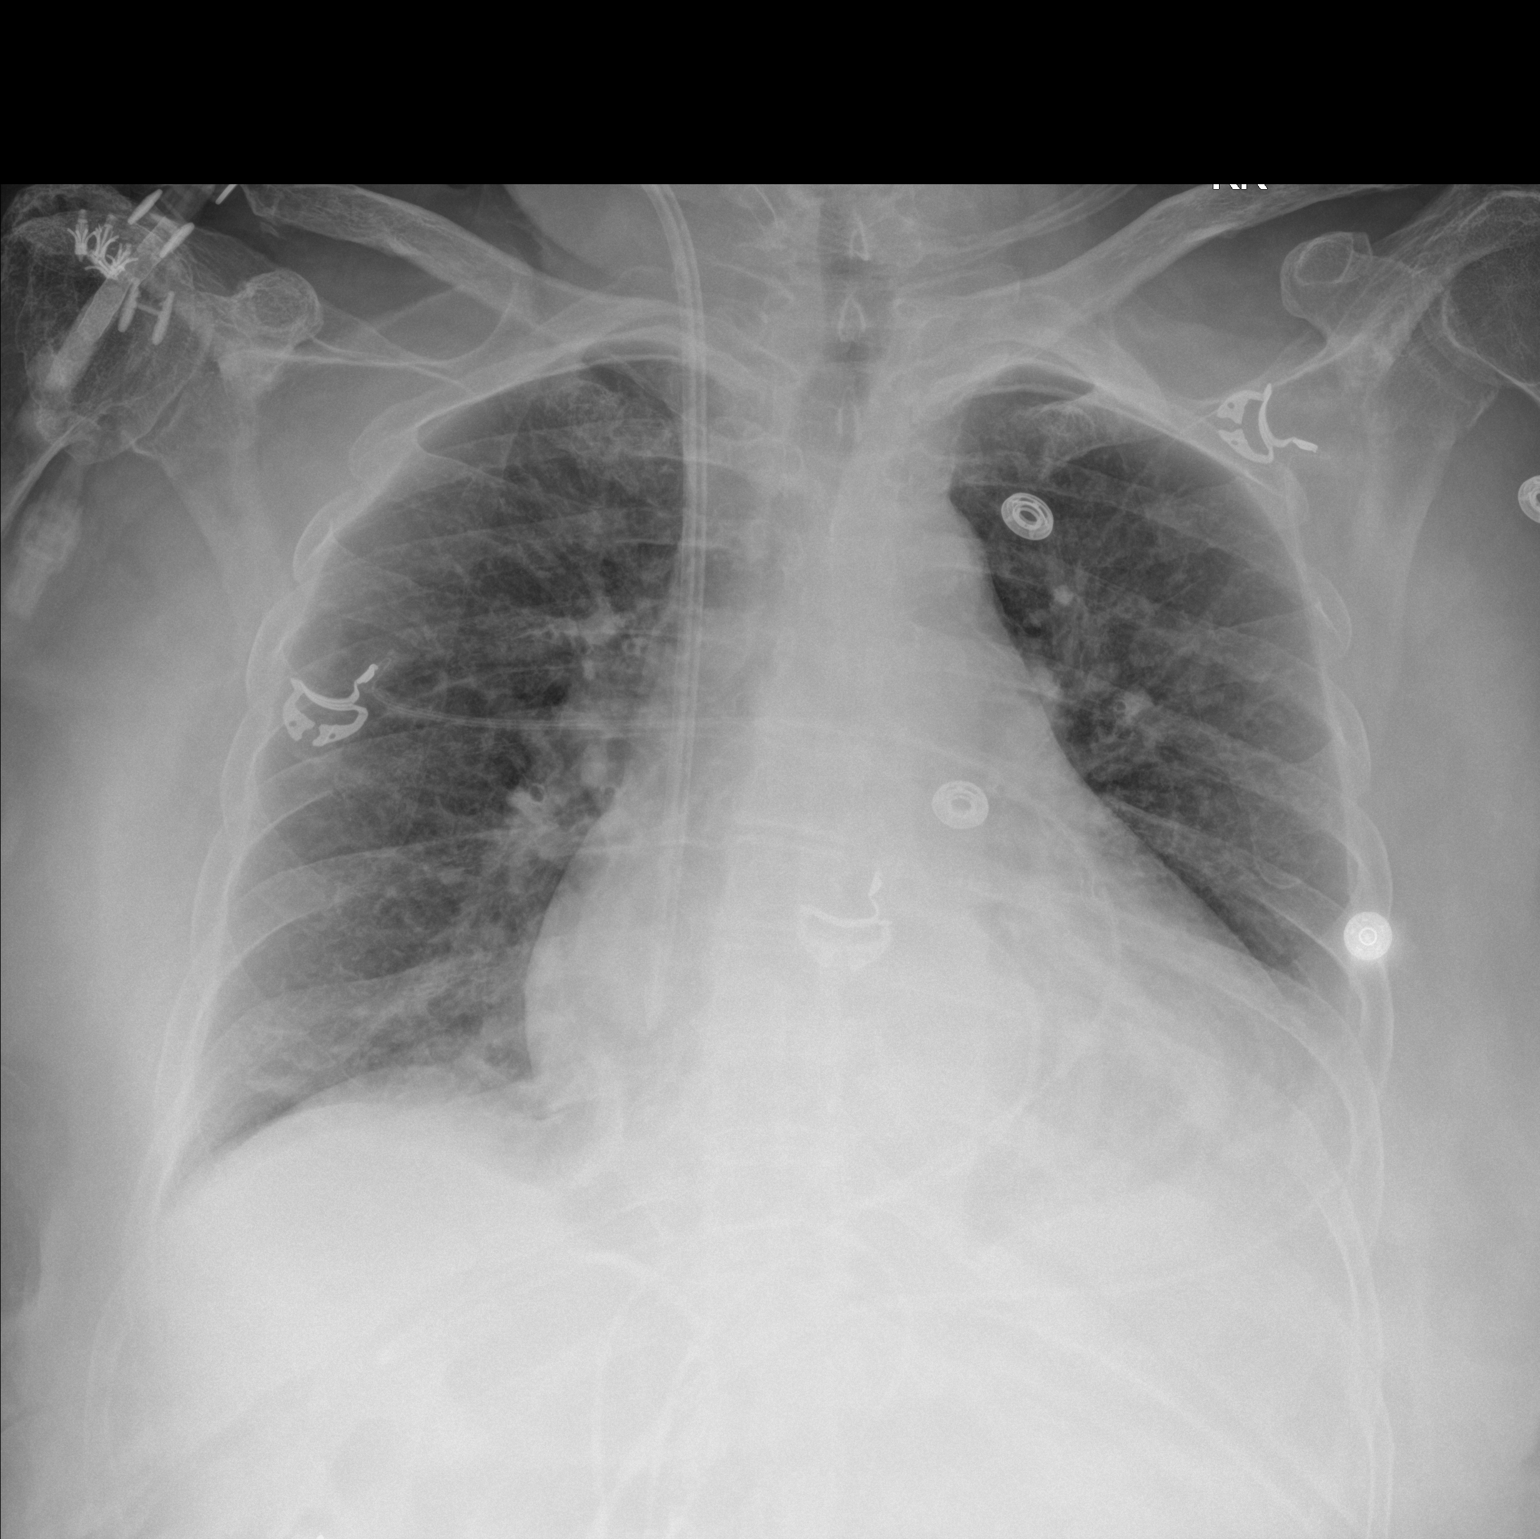

[1 of 1 positions shown; findings below may reference images not displayed]

FINDINGS: The cardiomediastinal silhouette is unchanged enlarged in contour.No
PICC line is visualized as per order requisition. There is a RIGHT
chest CVC with tip terminating over the RIGHT atrium. Small LEFT
pleural effusion. No pneumothorax. No acute pleuroparenchymal
abnormality. Visualized abdomen is unremarkable. Degenerative
changes of the RIGHT shoulder.
IMPRESSION: No PICC line is visualized as per order requisition. There is a
RIGHT chest CVC with tip terminating over the RIGHT atrium.

## 2020-12-13 IMAGING — DX DG CHEST 1V PORT
1 series · 1 of 1 positions shown · non-contrast
Comparison: Portable chest [DATE] and earlier.

CLINICAL DATA: 75-year-old female with hypoxia.

EXAM:
PORTABLE CHEST 1 VIEW

[chest ap]
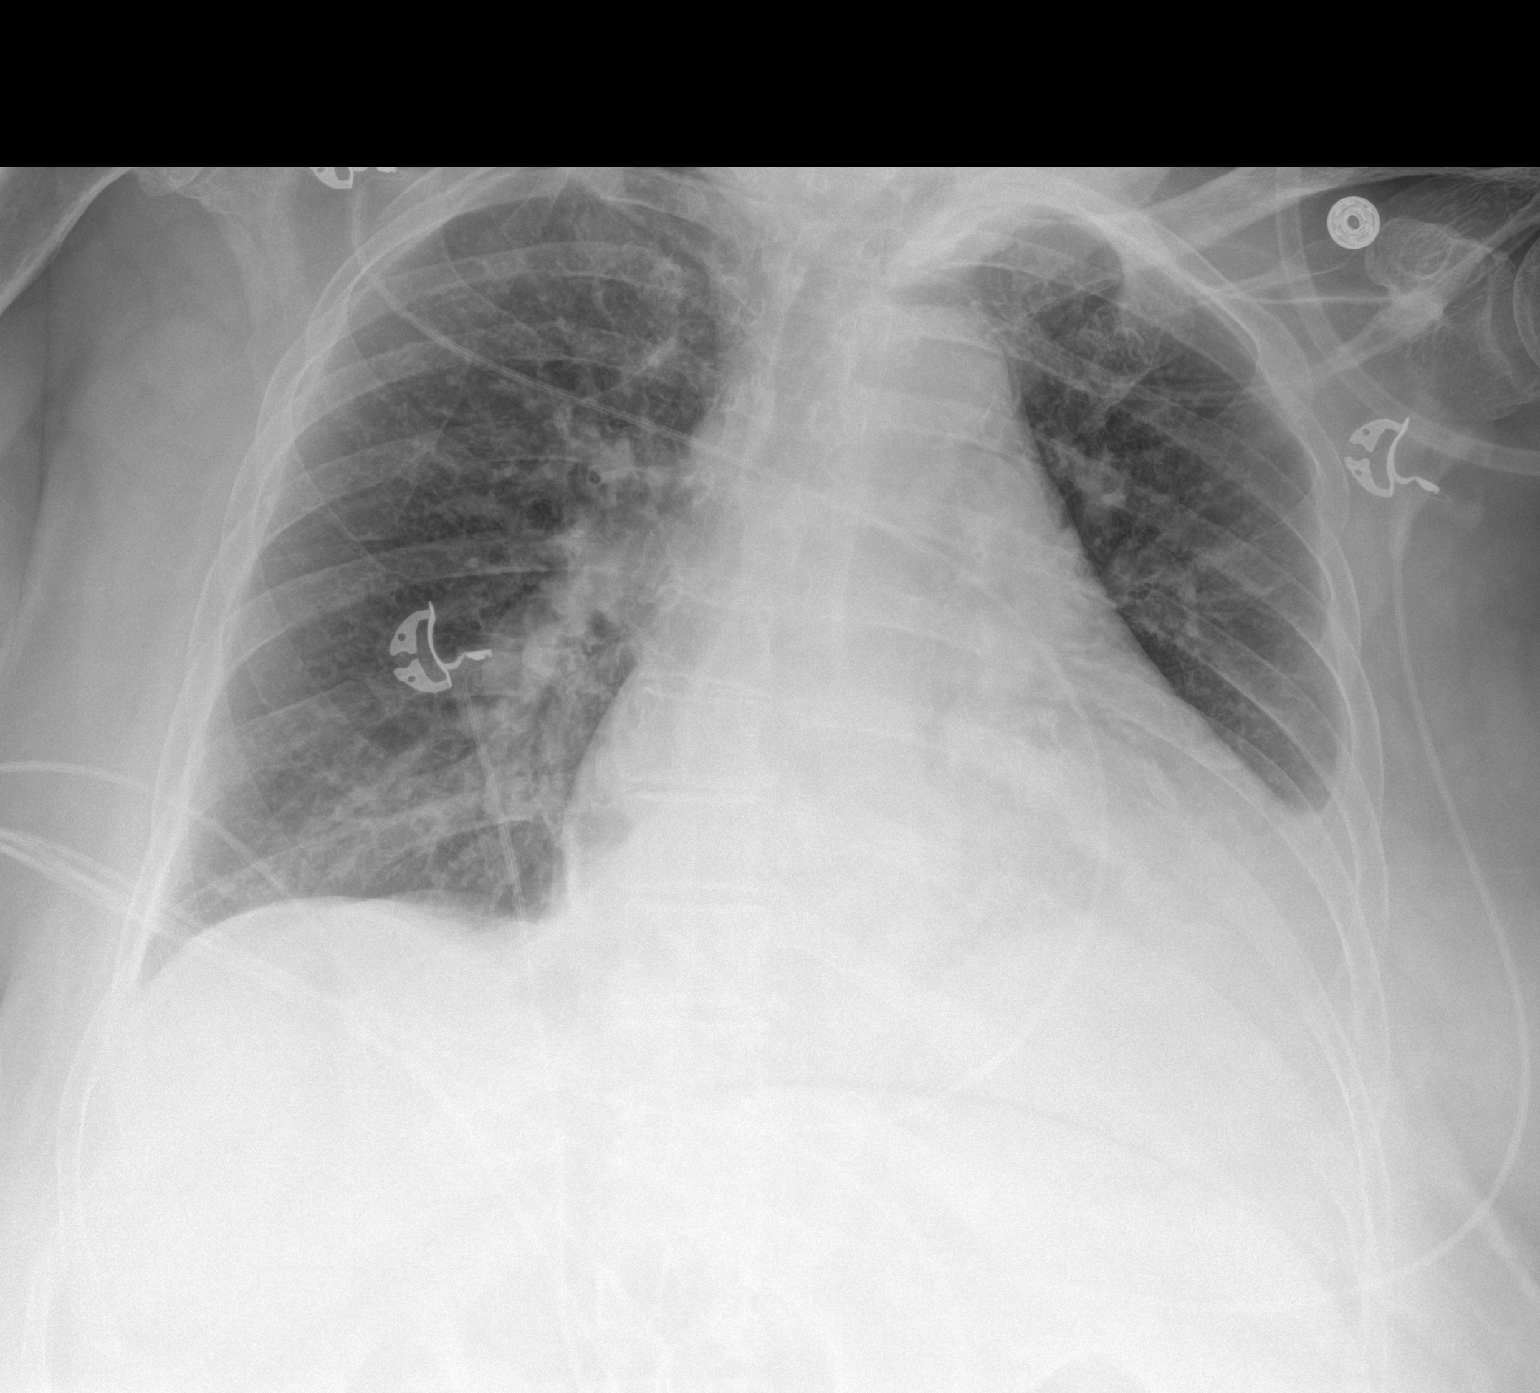

[1 of 1 positions shown; findings below may reference images not displayed]

FINDINGS: Portable AP upright view at [T2] hours. Progressive left lung base
opacification, obscuring the left hemidiaphragm. Stable cardiac size
and mediastinal contours. Right lung appears stable. And pulmonary
vascularity has not significantly changed since [DATE]. No overt
edema. No acute osseous abnormality identified. Paucity of bowel gas
in the upper abdomen. Chronic degeneration and postoperative changes
to the right shoulder.
IMPRESSION: Confluent left lung base opacification since [DATE]. Consider
left lower lobe pneumonia and/or increasing pleural effusion.

## 2020-12-13 IMAGING — MR MR MRA HEAD W/O CM
1 series · 19 of 48 positions shown · non-contrast
Comparison: Same-day brain MRI [DATE].

CLINICAL DATA: Seizure, abnormal neuro exam.

EXAM:
MRA HEAD WITHOUT CONTRAST
TECHNIQUE: Angiographic images of the Circle of Willis were acquired using MRA
technique without intravenous contrast.

[Series 19: TOF · axial · 0.5mm · 0.41mm/px · z∈[-75,+21]mm · 19 of 205 slices shown]
[im 1/205]
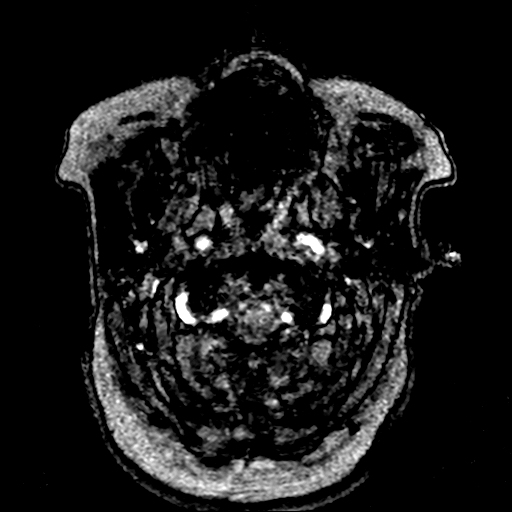
[im 5/205]
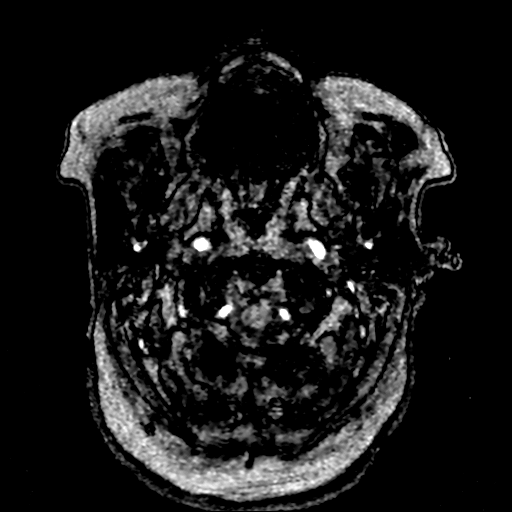
[im 9/205]
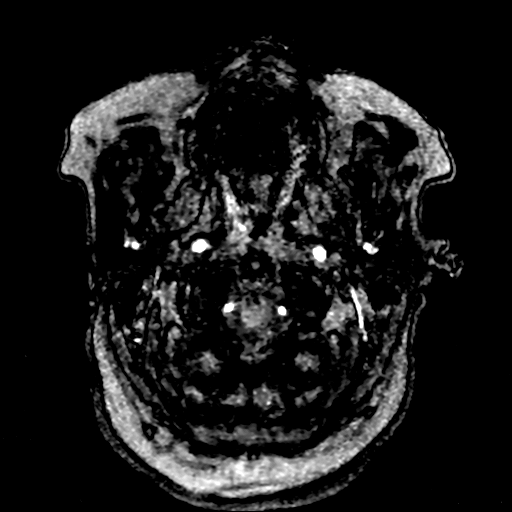
[im 14/205]
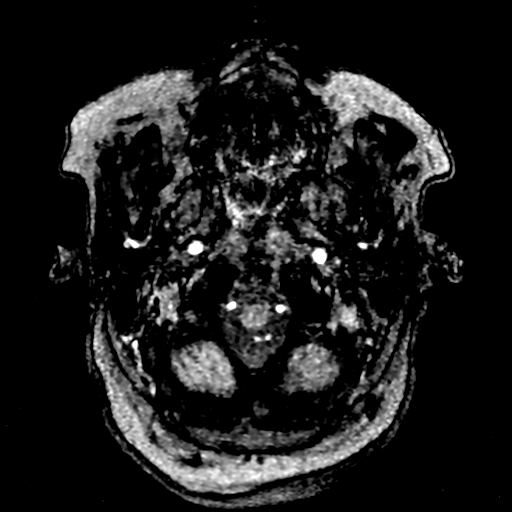
[im 18/205]
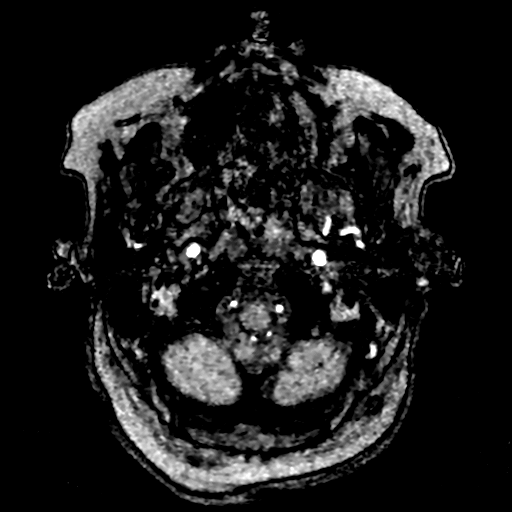
[im 22/205]
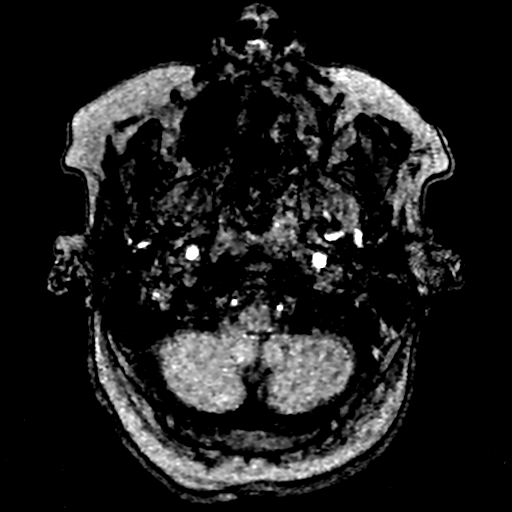
[im 27/205]
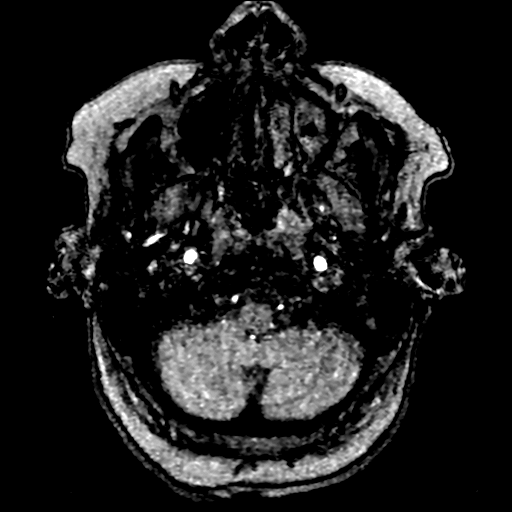
[im 31/205]
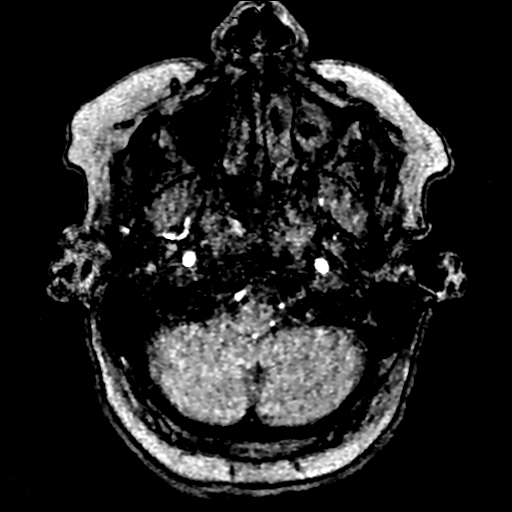
[im 35/205]
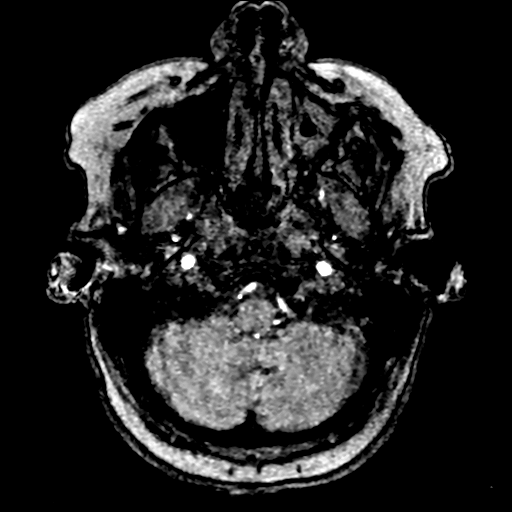
[im 40/205]
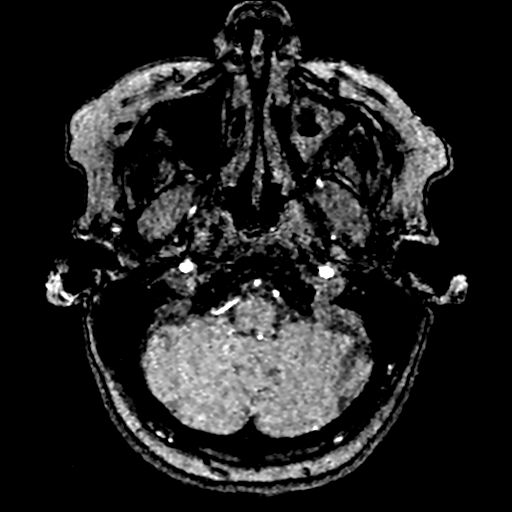
[im 44/205]
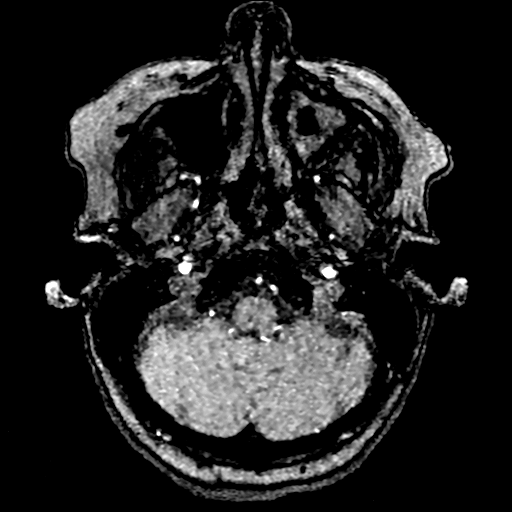
[im 66/205]
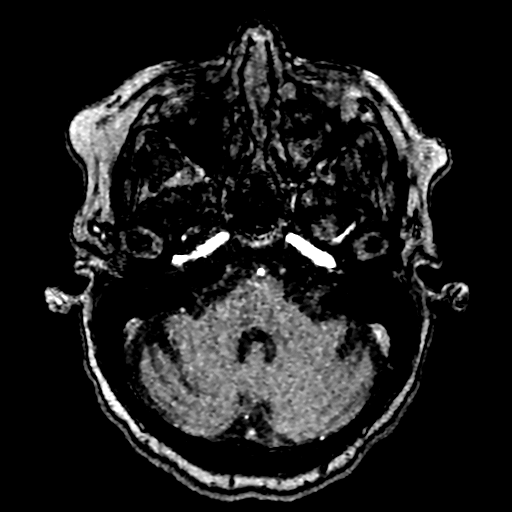
[im 92/205]
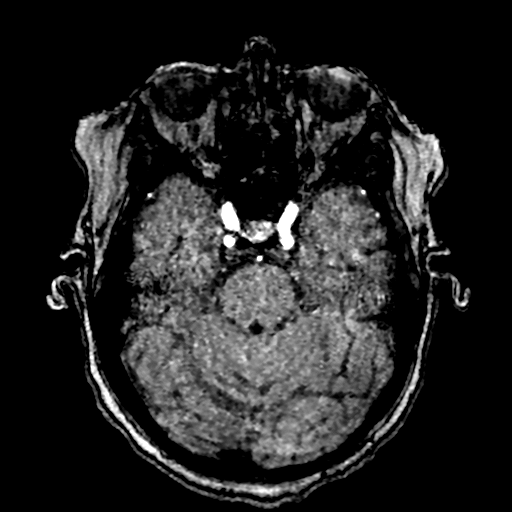
[im 105/205]
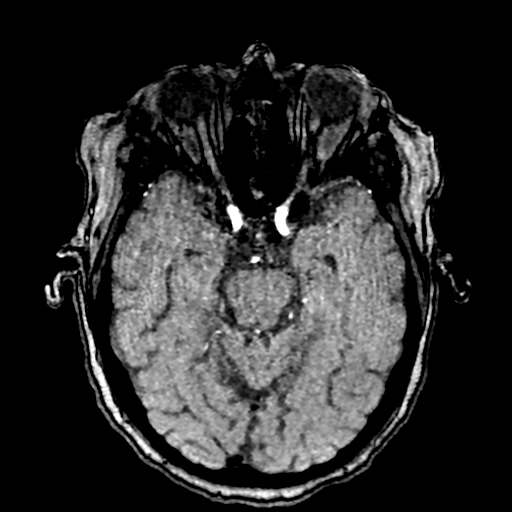
[im 118/205]
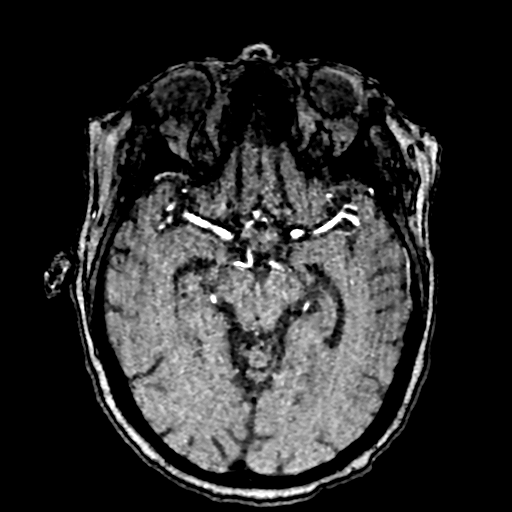
[im 144/205]
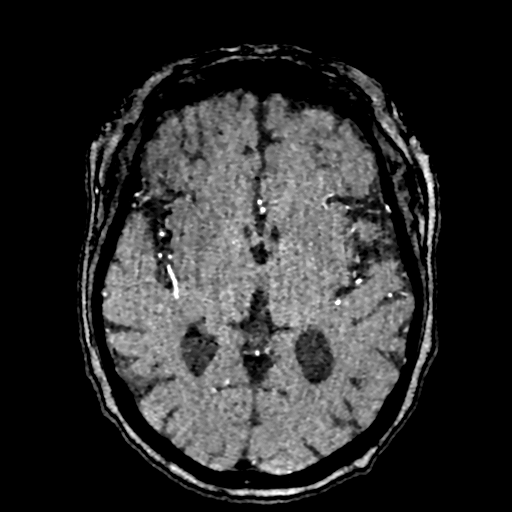
[im 170/205]
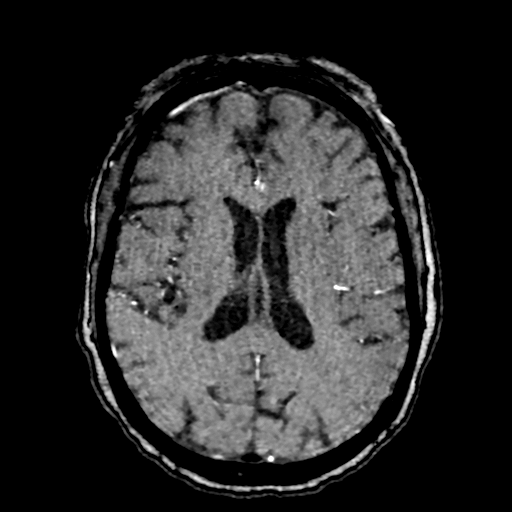
[im 174/205]
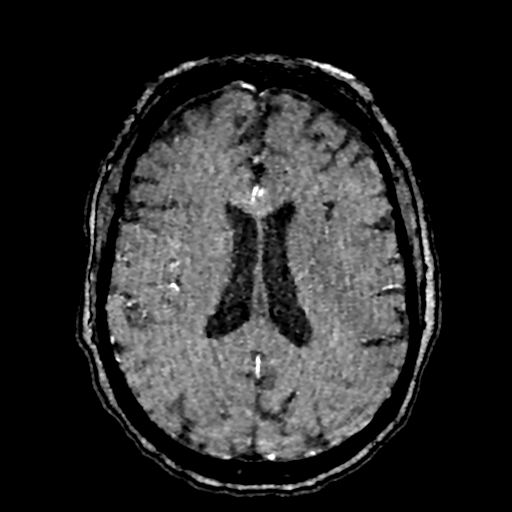
[im 196/205]
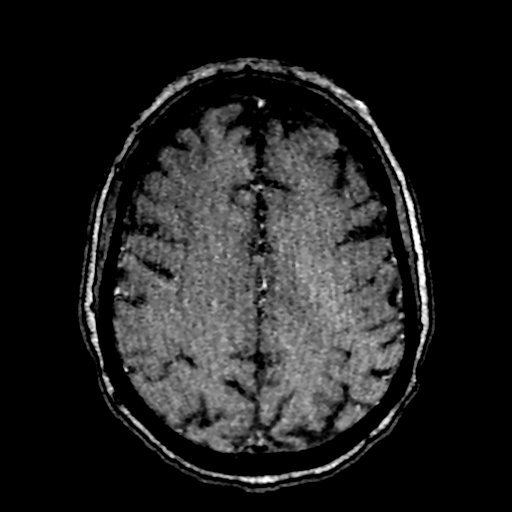

[19 of 48 positions shown; findings below may reference images not displayed]

FINDINGS: Mildly motion degraded examination.

Anterior circulation:

The intracranial internal carotid arteries are patent. The M1 middle
cerebral arteries are patent. Atherosclerotic irregularity of the M2
and more distal middle cerebral artery vessels bilaterally. Most
notably, there are sites of severe stenosis within mid M2 left MCA
vessels. The anterior cerebral arteries are patent. 2 mm inferiorly
projecting vascular protrusion arising from the cavernous right ICA,
likely reflecting an aneurysm (series [3Z], image 232).

Posterior circulation:

The intracranial vertebral arteries are patent. The basilar artery
is patent. The posterior cerebral arteries are patent. P1 PCA
segments are hypoplastic bilaterally with superimposed high-grade
stenoses. However, there are sizable bilateral posterior
communicating arteries which are widely patent. Severe stenosis
within the proximal P2 right PCA. Severe right PCA distal branch
atherosclerotic irregularity.

Anatomic variants: As described
IMPRESSION: Intracranial atherosclerotic disease with multifocal stenoses, as
outlined and with findings most notably as follows.

Sites of severe stenosis within mid M2 left MCA vessels.

The P1 posterior cerebral artery segments are hypoplastic with
superimposed severe stenoses bilaterally. However, there are sizable
bilateral posterior communicating arteries which are widely patent.
Severe stenosis within the proximal P2 right PCA. Severe right PCA
distal branch atherosclerotic irregularity.

2 mm vascular protrusion arising from the cavernous right ICA,
likely reflecting an aneurysm.

## 2020-12-13 MED ORDER — ORAL CARE MOUTH RINSE
15.0000 mL | OROMUCOSAL | Status: DC
  Administered 2020-12-13 – 2020-12-19 (×28): 15 mL via OROMUCOSAL

## 2020-12-13 MED ORDER — VALPROIC ACID 250 MG PO CAPS
500.0000 mg | ORAL_CAPSULE | Freq: Three times a day (TID) | ORAL | Status: DC
  Filled 2020-12-13 (×3): qty 2

## 2020-12-13 MED ORDER — LORAZEPAM 2 MG/ML IJ SOLN: 4.0000 mg | INTRAMUSCULAR | Status: DC | PRN

## 2020-12-13 MED ORDER — LORAZEPAM 2 MG/ML IJ SOLN
0.5000 mg | Freq: Once | INTRAMUSCULAR | Status: DC
  Filled 2020-12-13: qty 1

## 2020-12-13 MED ORDER — SODIUM CHLORIDE 0.9% FLUSH
10.0000 mL | Freq: Two times a day (BID) | INTRAVENOUS | Status: DC
  Administered 2020-12-13 – 2020-12-19 (×11): 10 mL

## 2020-12-13 MED ORDER — LORAZEPAM 2 MG/ML IJ SOLN
1.0000 mg | INTRAMUSCULAR | Status: DC | PRN
Start: 2020-12-13 — End: 2020-12-13

## 2020-12-13 MED ORDER — CEFAZOLIN SODIUM-DEXTROSE 1-4 GM/50ML-% IV SOLN
1.0000 g | INTRAVENOUS | Status: DC
  Administered 2020-12-13 – 2020-12-17 (×5): 1 g via INTRAVENOUS
  Filled 2020-12-13 (×6): qty 50

## 2020-12-13 MED ORDER — DEXTROSE-NACL 5-0.9 % IV SOLN: INTRAVENOUS | Status: DC

## 2020-12-13 MED ORDER — CALCIUM GLUCONATE-NACL 1-0.675 GM/50ML-% IV SOLN
1.0000 g | Freq: Once | INTRAVENOUS | Status: AC
  Administered 2020-12-13: 1000 mg via INTRAVENOUS
  Filled 2020-12-13: qty 50

## 2020-12-13 MED ORDER — LEVETIRACETAM IN NACL 1000 MG/100ML IV SOLN
1000.0000 mg | Freq: Once | INTRAVENOUS | Status: AC
  Administered 2020-12-13: 1000 mg via INTRAVENOUS
  Filled 2020-12-13: qty 100

## 2020-12-13 MED ORDER — SODIUM CHLORIDE 0.9 % IV SOLN
75.0000 mL/h | INTRAVENOUS | Status: DC
  Administered 2020-12-13: 75 mL/h via INTRAVENOUS

## 2020-12-13 MED ORDER — HYDROMORPHONE HCL 1 MG/ML IJ SOLN
0.5000 mg | INTRAMUSCULAR | Status: DC | PRN
  Administered 2020-12-13: 0.5 mg via INTRAVENOUS
  Filled 2020-12-13: qty 1

## 2020-12-13 MED ORDER — LORAZEPAM 2 MG/ML IJ SOLN: 2.0000 mg | INTRAMUSCULAR | Status: DC | PRN

## 2020-12-13 MED ORDER — CHLORHEXIDINE GLUCONATE 0.12% ORAL RINSE (MEDLINE KIT)
15.0000 mL | Freq: Two times a day (BID) | OROMUCOSAL | Status: DC
  Administered 2020-12-14 – 2020-12-18 (×9): 15 mL via OROMUCOSAL
  Filled 2020-12-13: qty 15

## 2020-12-13 MED ORDER — SODIUM CHLORIDE 0.9 % IV SOLN
1.0000 g | Freq: Once | INTRAVENOUS | Status: DC
  Filled 2020-12-13: qty 10

## 2020-12-13 MED ORDER — SODIUM CHLORIDE 0.9 % IV SOLN
1500.0000 mg | Freq: Once | INTRAVENOUS | Status: AC
  Administered 2020-12-13: 1500 mg via INTRAVENOUS
  Filled 2020-12-13: qty 30

## 2020-12-13 MED ORDER — PHENYTOIN SODIUM 50 MG/ML IJ SOLN
100.0000 mg | Freq: Three times a day (TID) | INTRAMUSCULAR | Status: DC
  Administered 2020-12-13 – 2020-12-15 (×5): 100 mg via INTRAVENOUS
  Filled 2020-12-13 (×7): qty 2

## 2020-12-13 MED ORDER — SODIUM CHLORIDE 0.9% FLUSH
10.0000 mL | INTRAVENOUS | Status: DC | PRN
  Administered 2020-12-17: 10 mL

## 2020-12-13 NOTE — Progress Notes (Signed)
Temporary hemodialysis cath order was placed.  Patient was alert and oriented enough to sign consent.  2 nurse witnessed patient orientation and alertness and consent was signed by patient.

## 2020-12-13 NOTE — Progress Notes (Signed)
PHARMACY NOTE:  ANTIMICROBIAL RENAL DOSAGE ADJUSTMENT  Current antimicrobial regimen includes a mismatch between antimicrobial dosage and estimated renal function.  As per policy approved by the Pharmacy & Therapeutics and Medical Executive Committees, the antimicrobial dosage will be adjusted accordingly.  Current antimicrobial dosage:  Cefazolin 2g q12h  Indication: MSSA bacteremia  Renal Function:  Estimated Creatinine Clearance: 8.7 mL/min (A) (by C-G formula based on SCr of 6.07 mg/dL (H)). []      On intermittent HD, scheduled: []      On CRRT    Antimicrobial dosage has been changed to:  Cefazolin 1g q24  Additional comments:   Thank you for allowing pharmacy to be a part of this patient's care.  , PharmD, BCPS Clinical Pharmacist 12/13/2020 9:46 AM

## 2020-12-13 NOTE — Progress Notes (Signed)
[facility]  PROGRESS NOTE   Subjective:   Events from this morning noted. Patient is now more awake.  She is scheduled for an MRI at this time. Spoke to the patient's daughter over the telephone.  Objective:  Vital signs in last 24 hours:  Temp:  [97.6 F (36.4 C)-98.6 F (37 C)] 98.4 F (36.9 C) (09/25 1157) Pulse Rate:  [67-91] 67 (09/25 1157) Resp:  [13-25] 17 (09/25 1157) BP: (81-155)/(48-81) 114/52 (09/25 1157) SpO2:  [90 %-100 %] 100 % (09/25 1157) Weight:  [98.3 kg] 98.3 kg (09/25 0503)  Weight change:  Filed Weights   12/09/20 0354 12/13/20 0503  Weight: 102.1 kg 98.3 kg    Intake/Output: I/O last 3 completed shifts: In: 6526 [P.O.:100; I.V.:5126.1; IV Piggyback:1299.9] Out: 301 [Urine:300; Stool:1]   Intake/Output this shift:  Total I/O In: 0  Out: 1 [Stool:1]  Physical Exam: General:  No acute distress  Head:  Normocephalic, atraumatic. Moist oral mucosal membranes  Eyes:  Anicteric  Neck:  Supple  Lungs:   Clear to auscultation, normal effort  Heart:  S1S2 no rubs  Abdomen:   Soft, nontender, bowel sounds present  Extremities:  peripheral edema.  Neurologic:  Awake, alert, following commands  Skin:  No lesions  Access:     Basic Metabolic Panel: Recent Labs  Lab 12/09/20 0415 12/10/20 0721 12/10/20 2318 12/11/20 1815 12/12/20 1158 12/13/20 0730  NA 134* 132* 130* 128* 128* 128*  K 3.4* 3.8 3.8 3.5 2.8* 3.7  CL 100 98 100 93* 82* 86*  CO2 22 18* 18* 20* 27 19*  GLUCOSE 238* 113* 128* 97 105* 143*  BUN 35* 57* 65* 70* 75* 80*  CREATININE 2.02* 4.21* 5.17* 5.28* 5.06* 6.07*  CALCIUM 8.5* 8.0* 7.3* 6.4* 6.0* 6.3*  MG 1.8  --   --   --   --   --     CBC: Recent Labs  Lab 12/09/20 0415 12/10/20 0721 12/12/20 0355 12/12/20 1158 12/13/20 0730  WBC 11.4* 13.9* 16.0* 16.0* 17.6*  NEUTROABS 9.1*  --   --  14.1*  --   HGB 12.3 10.7* 9.8* 9.8* 10.0*  HCT 37.6 33.0* 27.5* 27.1* 28.6*  MCV 86.0 84.2 78.8* 77.4* 79.9*  PLT  190 150 155 173 155     Urinalysis: No results for input(s): COLORURINE, LABSPEC, PHURINE, GLUCOSEU, HGBUR, BILIRUBINUR, KETONESUR, PROTEINUR, UROBILINOGEN, NITRITE, LEUKOCYTESUR in the last 72 hours.  Invalid input(s): APPERANCEUR    Imaging: CT HEAD WO CONTRAST (5MM)  Result Date: 12/13/2020 CLINICAL DATA:  75 year old female with new onset drooling, altered mental status. EXAM: CT HEAD WITHOUT CONTRAST TECHNIQUE: Contiguous axial images were obtained from the base of the skull through the vertex without intravenous contrast. COMPARISON:  Head CT 07/06/2012. FINDINGS: Study is mildly degraded by motion artifact despite repeated imaging attempts. Brain: Cerebral volume is not significantly changed since 2014 and is within normal limits for age. No midline shift, ventriculomegaly, mass effect, evidence of mass lesion, intracranial hemorrhage or evidence of cortically based acute infarction. Allowing for mild motion gray-white matter differentiation is within normal limits for age throughout the brain. Vascular: Mild Calcified atherosclerosis at the skull base. No suspicious intracranial vascular hyperdensity. Skull: No acute osseous abnormality identified. Chronic anterior C1-C2 degeneration. Sinuses/Orbits: Chronic left maxillary sinusitis with mild improvement since 2014. Other Visualized paranasal sinuses and mastoids are clear. Other: No acute orbit or scalp soft tissue finding. IMPRESSION: 1. Normal for age non contrast CT appearance of the brain when allowing for  mildly motion degraded study. 2. Chronic left maxillary sinusitis. Electronically Signed   By: Genevie Ann M.D.   On: 12/13/2020 09:08   DG Chest Port 1 View  Result Date: 12/13/2020 CLINICAL DATA:  75 year old female with hypoxia. EXAM: PORTABLE CHEST 1 VIEW COMPARISON:  Portable chest 11/21/2020 and earlier. FINDINGS: Portable AP upright view at 0806 hours. Progressive left lung base opacification, obscuring the left hemidiaphragm.  Stable cardiac size and mediastinal contours. Right lung appears stable. And pulmonary vascularity has not significantly changed since 12/09/2020. No overt edema. No acute osseous abnormality identified. Paucity of bowel gas in the upper abdomen. Chronic degeneration and postoperative changes to the right shoulder. IMPRESSION: Confluent left lung base opacification since 12/09/2020. Consider left lower lobe pneumonia and/or increasing pleural effusion. Electronically Signed   By: Genevie Ann M.D.   On: 12/13/2020 09:13   ECHOCARDIOGRAM COMPLETE  Result Date: 12/11/2020    ECHOCARDIOGRAM REPORT   Patient Name:   Jamie Benitez Date of Exam: 12/11/2020 Medical Rec #:  [id number]       Height:       60.0 in Accession #:    [id number]      Weight:       225.0 lb Date of Birth:  [birth year]        BSA:          1.962 m Patient Age:    75 years        BP:           97/73 mmHg Patient Gender: F               HR:           102 bpm. Exam Location:  [facility] Procedure: 2D Echo, Cardiac Doppler and Color Doppler Indications:     Bacteremia R78.81  History:         Patient has no prior history of Echocardiogram examinations.                  CHF, Previous Myocardial Infarction; Risk Factors:Hypertension                  and Diabetes.  Sonographer:     Sherrie Sport Referring Phys:  [id number] Tsosie Billing Diagnosing Phys: Kathlyn Sacramento MD  Sonographer Comments: Suboptimal apical window. IMPRESSIONS  1. Left ventricular ejection fraction, by estimation, is 25 to 30%. The left ventricle has severely decreased function. The left ventricle demonstrates regional wall motion abnormalities (see scoring diagram/findings for description). The left ventricular internal cavity size was mildly dilated. There is moderate left ventricular hypertrophy. Left ventricular diastolic parameters are indeterminate. There is akinesis of the left ventricular, entire inferior wall and inferolateral wall.  2. Right ventricular systolic function is normal.  The right ventricular size is normal. Tricuspid regurgitation signal is inadequate for assessing PA pressure.  3. Left atrial size was moderately dilated.  4. Right atrial size was mildly dilated.  5. The mitral valve is normal in structure. Moderate mitral valve regurgitation. No evidence of mitral stenosis.  6. The aortic valve is normal in structure. Aortic valve regurgitation is not visualized. Mild to moderate aortic valve sclerosis/calcification is present, without any evidence of aortic stenosis. Conclusion(s)/Recommendation(s): No evidence of valvular vegetations on this transthoracic echocardiogram. FINDINGS  Left Ventricle: Left ventricular ejection fraction, by estimation, is 25 to 30%. The left ventricle has severely decreased function. The left ventricle demonstrates regional wall motion abnormalities. The left ventricular internal cavity size was mildly  dilated.  There is moderate left ventricular hypertrophy. Left ventricular diastolic parameters are indeterminate. Right Ventricle: The right ventricular size is normal. No increase in right ventricular wall thickness. Right ventricular systolic function is normal. Tricuspid regurgitation signal is inadequate for assessing PA pressure. Left Atrium: Left atrial size was moderately dilated. Right Atrium: Right atrial size was mildly dilated. Pericardium: There is no evidence of pericardial effusion. Mitral Valve: The mitral valve is normal in structure. Moderate mitral valve regurgitation. No evidence of mitral valve stenosis. Tricuspid Valve: The tricuspid valve is normal in structure. Tricuspid valve regurgitation is not demonstrated. No evidence of tricuspid stenosis. Aortic Valve: The aortic valve is normal in structure. Aortic valve regurgitation is not visualized. Mild to moderate aortic valve sclerosis/calcification is present, without any evidence of aortic stenosis. Aortic valve mean gradient measures 2.5 mmHg. Aortic valve peak gradient measures  4.5 mmHg. Aortic valve area, by VTI measures 2.89 cm. Pulmonic Valve: The pulmonic valve was normal in structure. Pulmonic valve regurgitation is not visualized. No evidence of pulmonic stenosis. Aorta: The aortic root is normal in size and structure. Venous: The inferior vena cava was not well visualized. IAS/Shunts: No atrial level shunt detected by color flow Doppler.  LEFT VENTRICLE PLAX 2D LVIDd:         5.50 cm LVIDs:         4.60 cm LV PW:         1.40 cm LV IVS:        1.30 cm LVOT diam:     2.00 cm LV SV:         38 LV SV Index:   20 LVOT Area:     3.14 cm  RIGHT VENTRICLE RV Basal diam:  3.90 cm RV S prime:     11.60 cm/s TAPSE (M-mode): 3.6 cm LEFT ATRIUM             Index       RIGHT ATRIUM           Index LA diam:        5.10 cm 2.60 cm/m  RA Area:     21.20 cm LA Vol (A2C):   57.9 ml 29.50 ml/m RA Volume:   64.50 ml  32.87 ml/m LA Vol (A4C):   70.9 ml 36.13 ml/m LA Biplane Vol: 71.0 ml 36.18 ml/m  AORTIC VALVE                   PULMONIC VALVE AV Area (Vmax):    2.82 cm    PV Vmax:        0.80 m/s AV Area (Vmean):   2.90 cm    PV Peak grad:   2.6 mmHg AV Area (VTI):     2.89 cm    RVOT Peak grad: 4 mmHg AV Vmax:           105.50 cm/s AV Vmean:          76.700 cm/s AV VTI:            0.132 m AV Peak Grad:      4.5 mmHg AV Mean Grad:      2.5 mmHg LVOT Vmax:         94.60 cm/s LVOT Vmean:        70.700 cm/s LVOT VTI:          0.122 m LVOT/AV VTI ratio: 0.92  AORTA Ao Root diam: 2.70 cm MITRAL VALVE  TRICUSPID VALVE MV Area (PHT): 4.80 cm    TR Peak grad:   9.2 mmHg MV Decel Time: 158 msec    TR Vmax:        152.00 cm/s MV E velocity: 66.80 cm/s                            SHUNTS                            Systemic VTI:  0.12 m                            Systemic Diam: 2.00 cm Kathlyn Sacramento MD Electronically signed by Kathlyn Sacramento MD Signature Date/Time: 12/11/2020/4:17:31 PM    Final      Medications:    sodium chloride 75 mL/hr at 12/12/20 1941   sodium chloride 75 mL/hr  (12/13/20 1009)   amiodarone 30 mg/hr (12/13/20 0504)   calcium gluconate     calcium gluconate      ceFAZolin (ANCEF) IV     fosPHENYtoin (CEREBYX) IV      atorvastatin  80 mg Oral Daily   calcitRIOL  0.25 mcg Oral Daily   calcium carbonate  1,000 mg of elemental calcium Oral BID WC   chlorhexidine gluconate (MEDLINE KIT)  15 mL Mouth Rinse BID   Chlorhexidine Gluconate Cloth  6 each Topical Daily   clopidogrel  75 mg Oral Daily   hydrOXYzine  50 mg Oral Daily   insulin aspart  0-5 Units Subcutaneous QHS   insulin aspart  0-9 Units Subcutaneous TID WC   lidocaine  1 patch Transdermal Q24H   mouth rinse  15 mL Mouth Rinse 10 times per day   midodrine  10 mg Oral TID WC   pantoprazole  40 mg Oral Daily   phenytoin (DILANTIN) IV  100 mg Intravenous Q8H   traZODone  50 mg Oral QHS    Assessment/ Plan:     Principal Problem:   NSTEMI (non-ST elevated myocardial infarction) (HCC) Active Problems:   Chronic systolic CHF (congestive heart failure) (HCC)   Chronic back pain   Acute renal failure superimposed on stage 3a chronic kidney disease (HCC)   Depression with anxiety   Type II diabetes mellitus with renal manifestations (HCC)   CAD (coronary artery disease)   HLD (hyperlipidemia)   HTN (hypertension)   Hypokalemia   Leukocytosis   Acute decompensated heart failure (HCC)   Pressure injury of skin  75 year old white female with a history of hypertension, coronary artery disease, congestive heart failure, diabetes, hyperlipidemia, chronic kidney disease now admitted with history of generalized weakness.  She is found to have Staphylococcus sepsis.  She also has acute kidney injury on the top of chronic kidney disease.   #1: Acute kidney injury: Patient with acute kidney injury on the top of chronic kidney disease.  She has CKD stage IIIb with a GFR of 44 cc/min previously.  Presently the creatinine has gone up to 6.0 with a GFR of less than 15cc/min.  Spoke to the patient's  daughter over the telephone and she is agreeable to initiate dialysis.  In the meanwhile we will continue the IV fluids.   #2: Hypokalemia: Patient was hypokalemic yesterday which has now improved with potassium supplementation.  The hypokalemia is most likely secondary to bicarbonate infusion.    #3: Metabolic  acidosis: This is now resolved and will discontinue the sodium bicarbonate and IV fluids.   #4: Dehydration/hyponatremia: We will also continue the IV fluids with isotonic saline at 75 cc an hour.  We will monitor urine output closely.   #5: Anemia: Anemia is most likely due to chronic kidney disease.  Continue iron supplementation.  #6: Seizure activity: Etiology is unclear.  Patient is being followed by neurology.  Scheduled for an MRI today.  #7: Hypocalcemia: We will continue to supplement calcitriol and calcium gluconate IV today.   I spoke with family at bedside in detail and I will continue to give recommendations during the hospitalization as required   LOS: [place], MD [facility] 9/25/20221:13 PM

## 2020-12-13 NOTE — Progress Notes (Signed)
Patient is more alert and oriented at this time.   (A&Ox4).   Performed a swallow screen to assess patient ability to eat and drink.   Patient did not cough or choke on water, however,  had some delay while swallowing.  Concern for aspiration still.  Paged MD  Gave ok to place order for speech consult.   Will continue to hold oral medications at this time.

## 2020-12-13 NOTE — Consult Note (Addendum)
NEURO HOSPITALIST CONSULT NOTE   Requestig physician: Dr. Priscella Mann  Reason for Consult: New onset seizure  History obtained from:   Chart     HPI:                                                                                                                                          Jamie Benitez is an 75 y.o. female with CAD, CHF, CKD 3, DM2, HLD, HTN, chronic cough, neuropathy, osteoporosis and history of Bell's palsy, who presented to the hospital on 9/21 with fatigue and diffuse weakness after having had diarrhea for the past several days prior to admission. She was found to have acutely worsened renal function on her CKD3, and was D-dimer positive with elevated troponin and EKG changes, but overall pattern did not militate in favor of STEMI or NSTEMI per cardiology. She was felt possibly to have decompensation of her CHF as well, but could not be given diuretics due to her abnormal renal function. During her stay her renal function continued to worsen and Nephrology was consulted. She also had new onset of atrial fibrillation with RVR and was started on amiodarone. She has had persistent hypotension during this admission.  This morning, she had a spell witnessed by her RN that appeared most consistent with a new onset seizure. The description of the event is as follows: "At approximately 0615, phlebotomist approached pt's primary RN & informed her that the pt was "drooling"; and was that normal? Upon entering pt's room, Pt was noted to have foam around her mouth, eyes rolled up in her head. VS obtained, 130/65, HR 80 (remains in a-fib), SpO2 98% on 2L [place], CBG 136. Pt not responsive to painful stimuli, but would open eyes and not focus on anything in the room in particular. Rapid response called by other RN's, ICU CN and AC arrived and were informed of situation. On call provider messaged, orders received.."  She was loaded with Keppra 1000 mg IV and Neurology was consulted.     Past Medical History:  Diagnosis Date   Anxiety    Bell's palsy 08/2018   CHF (congestive heart failure) (HCC)    Chronic back pain    Chronic cough    CKD (chronic kidney disease), stage III ([place])    Depression    Diabetes mellitus, type 2 ([place])    GERD (gastroesophageal reflux disease)    Hyperlipidemia    Hypertension    Multilevel degenerative disc disease    Myocardial infarction [facility]) 2007   & 2014   Neuropathy    Osteoporosis     Past Surgical History:  Procedure Laterality Date   ABDOMINAL HYSTERECTOMY     BACK SURGERY     CARDIAC CATHETERIZATION  2007   & 2014.  stents   CATARACT EXTRACTION W/PHACO Right 09/16/2019   Procedure: CATARACT EXTRACTION PHACO AND INTRAOCULAR LENS PLACEMENT (IOC) RIGHT DIABETIC 2.12  00:27.3;  Surgeon: Eulogio Bear, MD;  Location: [place];  Service: Ophthalmology;  Laterality: Right;  Diabetic - insulin and oral meds   ROTATOR CUFF REPAIR     x2    Family History  Problem Relation Age of Onset   Diabetes type II Daughter    Diabetes type II Son               Social History:  reports that she quit smoking about 8 years ago. Her smoking use included cigarettes. She has never used smokeless tobacco. She reports that she does not currently use alcohol. She reports that she does not use drugs.  Allergies  Allergen Reactions   Sulfa Antibiotics Rash    Mouth blisters Other reaction(s): Angioedema "whole mouth swells"   Gabapentin Diarrhea    MEDICATIONS:                                                                                                                     No current facility-administered medications on file prior to encounter.   Current Outpatient Medications on File Prior to Encounter  Medication Sig Dispense Refill   atorvastatin (LIPITOR) 80 MG tablet Take 80 mg by mouth daily.     buPROPion (WELLBUTRIN XL) 150 MG 24 hr tablet Take 150 mg by mouth daily.     clopidogrel (PLAVIX) 75 MG tablet  Take 75 mg by mouth daily.     glipiZIDE (GLUCOTROL XL) 2.5 MG 24 hr tablet Take 2.5 mg by mouth daily with breakfast.     HUMULIN 70/30 KWIKPEN (70-30) 100 UNIT/ML KwikPen Inject 10 Units into the skin 2 (two) times daily before a meal.     hydrOXYzine (ATARAX/VISTARIL) 50 MG tablet Take 50 mg by mouth daily.     isosorbide mononitrate (IMDUR) 30 MG 24 hr tablet Take 30 mg by mouth daily.     lisinopril (ZESTRIL) 5 MG tablet Take 10 mg by mouth daily.     meloxicam (MOBIC) 15 MG tablet Take 15 mg by mouth daily.     mupirocin ointment (BACTROBAN) 2 % Place 1 application into the nose 2 (two) times daily as needed.     omeprazole (PRILOSEC) 40 MG capsule Take 40 mg by mouth daily.     traMADol (ULTRAM) 50 MG tablet Take 50 mg by mouth daily.     traZODone (DESYREL) 50 MG tablet Take 50 mg by mouth at bedtime.       No current facility-administered medications on file prior to encounter.   Current Outpatient Medications on File Prior to Encounter  Medication Sig Dispense Refill   atorvastatin (LIPITOR) 80 MG tablet Take 80 mg by mouth daily.     buPROPion (WELLBUTRIN XL) 150 MG 24 hr tablet Take 150 mg by mouth daily.     clopidogrel (PLAVIX) 75 MG tablet Take 75 mg by  mouth daily.     glipiZIDE (GLUCOTROL XL) 2.5 MG 24 hr tablet Take 2.5 mg by mouth daily with breakfast.     HUMULIN 70/30 KWIKPEN (70-30) 100 UNIT/ML KwikPen Inject 10 Units into the skin 2 (two) times daily before a meal.     hydrOXYzine (ATARAX/VISTARIL) 50 MG tablet Take 50 mg by mouth daily.     isosorbide mononitrate (IMDUR) 30 MG 24 hr tablet Take 30 mg by mouth daily.     lisinopril (ZESTRIL) 5 MG tablet Take 10 mg by mouth daily.     meloxicam (MOBIC) 15 MG tablet Take 15 mg by mouth daily.     mupirocin ointment (BACTROBAN) 2 % Place 1 application into the nose 2 (two) times daily as needed.     omeprazole (PRILOSEC) 40 MG capsule Take 40 mg by mouth daily.     traMADol (ULTRAM) 50 MG tablet Take 50 mg by mouth  daily.     traZODone (DESYREL) 50 MG tablet Take 50 mg by mouth at bedtime.       ROS:                                                                                                                                       Complains of joint pain during the examination. Endorses continued diffuse weakness.   Blood pressure 140/77, pulse 87, temperature 97.8 F (36.6 C), resp. rate 16, height 5' (1.524 m), weight 98.3 kg, SpO2 98 %.   General Examination:                                                                                                       Physical Exam  HEENT-  [place]/AT   Lungs- Respirations unlabored Extremities- Warm and well perfused. No cyanosis noted.   Neurological Examination Mental Status: Mildly drowsy, with decreased level of alertness. Speech is fluent with naming for two object correct, but cannot verbally identify an index finger. Repetition intact. Able to follow all commands. Oriented to year, city and state, but not to day of the week or month.  Cranial Nerves: II: OD: Visual fields intact with no extinction to DSS.  OS: Blind PERRL.  III,IV, VI: Eyelids are slightly drooping symmetrically. Eyes are dysconjugate with decreased motility OS. Has hesitation when tracking to left and right with her right eye and could not fully bury sclerae at extremes of gaze.   V: Temp sensation equal bilaterally VII: Smile slightly asymmetric in the context of her  history of Bell's palsy (decreased NL fold on the right.).  VIII: HOH IX,X: No hypophonia XI: Head is midline. XII: Midline tongue extension Motor: RUE:  Shoulder pain due to old rotator cuff injury limits shoulder movement. Able to lift and push with forearm with 3/5 strength. Grip 4-/5 LUE: Unable to lift off bed at shoulder. Able to lift with forearm with 3/5 strength. Grip 4-/5 RLE: Cannot lift off bed with hip flexion. Can internally and externally rotate leg. Does not allow examiner to lift leg to test knee  extension due to "pain all over" when passively moved. ADF 3/5.  LLE: Cannot lift off bed with hip flexion. Can internally and externally rotate leg. Does not allow examiner to lift leg to test knee extension due to "pain all over" when passively moved. ADF 3/5. Sensory: Temp and FT intact in all 4 extremities. No extinction to DSS.  Deep Tendon Reflexes: 1+ bilateral brachioradialis. 1+ bilateral patellae.  Plantars: Equivocal bilaterally Cerebellar: Unable to assess due to inability to lift arms or legs off bed.  Gait: Unable to assess    Lab Results: Basic Metabolic Panel: Recent Labs  Lab 12/09/20 0415 12/10/20 0721 12/10/20 2318 12/11/20 1815 12/12/20 1158  NA 134* 132* 130* 128* 128*  K 3.4* 3.8 3.8 3.5 2.8*  CL 100 98 100 93* 82*  CO2 22 18* 18* 20* 27  GLUCOSE 238* 113* 128* 97 105*  BUN 35* 57* 65* 70* 75*  CREATININE 2.02* 4.21* 5.17* 5.28* 5.06*  CALCIUM 8.5* 8.0* 7.3* 6.4* 6.0*  MG 1.8  --   --   --   --     CBC: Recent Labs  Lab 12/09/20 0415 12/10/20 0721 12/12/20 0355 12/12/20 1158 12/13/20 0730  WBC 11.4* 13.9* 16.0* 16.0* 17.6*  NEUTROABS 9.1*  --   --  14.1*  --   HGB 12.3 10.7* 9.8* 9.8* 10.0*  HCT 37.6 33.0* 27.5* 27.1* 28.6*  MCV 86.0 84.2 78.8* 77.4* 79.9*  PLT 190 150 155 173 155    Cardiac Enzymes: No results for input(s): CKTOTAL, CKMB, CKMBINDEX, TROPONINI in the last 168 hours.  Lipid Panel: Recent Labs  Lab 12/10/20 0721  CHOL 94  TRIG 120  HDL 33*  CHOLHDL 2.8  VLDL 24  LDLCALC 37    Imaging: MR THORACIC SPINE WO CONTRAST  Result Date: 12/11/2020 CLINICAL DATA:  Low back pain, infection suspected; Mid-back pain, neuro deficit EXAM: MRI THORACIC AND LUMBAR SPINE WITHOUT CONTRAST TECHNIQUE: Multiplanar and multiecho pulse sequences of the thoracic and lumbar spine were obtained without intravenous contrast. COMPARISON:  None. FINDINGS: MRI THORACIC SPINE FINDINGS Alignment:  No substantial sagittal subluxation. Vertebrae:  Vertebral body heights are maintained. No focal marrow edema to suggest osteomyelitis or acute fracture. Cord:  Normal cord signal. Paraspinal and other soft tissues: Appreciable paraspinal edema. Hiatal hernia. Disc levels: Mild disc bulging at T7-T8 and T8-T9 without significant canal stenosis. Small broad disc bulge with central disc protrusion at T9-T10 with ligamentum flavum thickening. Resulting mild canal stenosis. Multilevel facet arthropathy, greatest in the lower thoracic spine with mild bilateral foraminal stenosis at T8-T9, T9-T10, and T10-T11. Mild foraminal stenosis on the right at T11-T12. T12-L1 levels discussed below. MRI LUMBAR SPINE FINDINGS Segmentation:  Standard. Alignment:  Grade 1 anterolisthesis L4 on L5. Vertebrae: Edema within the T12-L1, L4-L5 and L5-S1 discs. Chronic appearing degenerative endplate signal changes at T12-L1 without significant marrow edema. Conus medullaris and cauda equina: Conus extends to the L1 level. Conus appears normal. Paraspinal and  other soft tissues: Partially imaged dilated gallbladder with borderline dilated common bile duct, measuring approximately 6 mm. Left renal cysts. Disc levels: T12-L1: Degenerative disc height loss with disc edema. Posterior disc bulge with moderate bilateral facet hypertrophy. Ligamentum flavum thickening. Resulting moderate right foraminal stenosis and mild canal stenosis. No significant left foraminal stenosis. L1-L2: Left eccentric disc bulge and mild left greater than right facet hypertrophy. Ligamentum flavum thickening. Resulting mild left foraminal stenosis without significant canal or right foraminal stenosis. L2-L3: Mild disc bulging and mild-to-moderate bilateral facet arthropathy. No significant canal or foraminal stenosis. L3-L4: Broad disc bulge with bulky ligamentum flavum thickening and moderate bilateral facet arthropathy. Resulting mild-to-moderate canal stenosis without significant foraminal stenosis. L4-L5: Right  eccentric disc height loss. Grade 1 anterolisthesis of L4 on L5. Uncovering of the disc with superimposed small left subarticular disc protrusion. Moderate right greater than left facet hypertrophy. Resulting mild to moderate right subarticular and foraminal stenosis. No significant canal or left foraminal stenosis. L5-S1: Left eccentric disc height loss. Left eccentric disc bulge and endplate spurring. Moderate left mild right facet arthropathy. Resulting mild-to-moderate left foraminal stenosis with mild left subarticular recess narrowing. No significant canal or right foraminal stenosis. IMPRESSION: MR THORACIC SPINE IMPRESSION 1. Mild canal stenosis at T9-T10. Otherwise, multilevel small disc bulges without significant canal stenosis. 2. Mild multilevel foraminal stenosis in the lower thoracic spine, described above. 3. T12-L1 is discussed below. 4. Hiatal hernia. MR LUMBAR SPINE IMPRESSION 1. At T12-L1, moderate right foraminal stenosis with mild canal stenosis. Degenerative disc disease at this level with disc edema and slight left paraspinal edema, most likely degenerative/inflammatory in etiology. Infectious discitis is thought less likely but if there is clinical concern for infection then consider correlation with inflammatory markers and short interval follow-up MRI with contrast to ensure stability. 2. At L3-L4, mild-to-moderate canal stenosis. 3. At L4-L5, right eccentric degenerative change with mild-to-moderate right subarticular recess and foraminal stenosis. 4. At L5-S1, left eccentric degenerative change with mild-to-moderate left foraminal stenosis and mild left subarticular recess narrowing. 5. Partially imaged dilated gallbladder with borderline dilated common bile duct. This could be physiologic; however, consider correlation with liver function enzymes to exclude obstruction. Right upper quadrant ultrasound could also further evaluate if clinically indicated. Electronically Signed   By:  Margaretha Sheffield M.D.   On: 12/11/2020 12:29   MR LUMBAR SPINE WO CONTRAST  Result Date: 12/11/2020 CLINICAL DATA:  Low back pain, infection suspected; Mid-back pain, neuro deficit EXAM: MRI THORACIC AND LUMBAR SPINE WITHOUT CONTRAST TECHNIQUE: Multiplanar and multiecho pulse sequences of the thoracic and lumbar spine were obtained without intravenous contrast. COMPARISON:  None. FINDINGS: MRI THORACIC SPINE FINDINGS Alignment:  No substantial sagittal subluxation. Vertebrae: Vertebral body heights are maintained. No focal marrow edema to suggest osteomyelitis or acute fracture. Cord:  Normal cord signal. Paraspinal and other soft tissues: Appreciable paraspinal edema. Hiatal hernia. Disc levels: Mild disc bulging at T7-T8 and T8-T9 without significant canal stenosis. Small broad disc bulge with central disc protrusion at T9-T10 with ligamentum flavum thickening. Resulting mild canal stenosis. Multilevel facet arthropathy, greatest in the lower thoracic spine with mild bilateral foraminal stenosis at T8-T9, T9-T10, and T10-T11. Mild foraminal stenosis on the right at T11-T12. T12-L1 levels discussed below. MRI LUMBAR SPINE FINDINGS Segmentation:  Standard. Alignment:  Grade 1 anterolisthesis L4 on L5. Vertebrae: Edema within the T12-L1, L4-L5 and L5-S1 discs. Chronic appearing degenerative endplate signal changes at T12-L1 without significant marrow edema. Conus medullaris and cauda equina: Conus extends to the L1  level. Conus appears normal. Paraspinal and other soft tissues: Partially imaged dilated gallbladder with borderline dilated common bile duct, measuring approximately 6 mm. Left renal cysts. Disc levels: T12-L1: Degenerative disc height loss with disc edema. Posterior disc bulge with moderate bilateral facet hypertrophy. Ligamentum flavum thickening. Resulting moderate right foraminal stenosis and mild canal stenosis. No significant left foraminal stenosis. L1-L2: Left eccentric disc bulge and mild  left greater than right facet hypertrophy. Ligamentum flavum thickening. Resulting mild left foraminal stenosis without significant canal or right foraminal stenosis. L2-L3: Mild disc bulging and mild-to-moderate bilateral facet arthropathy. No significant canal or foraminal stenosis. L3-L4: Broad disc bulge with bulky ligamentum flavum thickening and moderate bilateral facet arthropathy. Resulting mild-to-moderate canal stenosis without significant foraminal stenosis. L4-L5: Right eccentric disc height loss. Grade 1 anterolisthesis of L4 on L5. Uncovering of the disc with superimposed small left subarticular disc protrusion. Moderate right greater than left facet hypertrophy. Resulting mild to moderate right subarticular and foraminal stenosis. No significant canal or left foraminal stenosis. L5-S1: Left eccentric disc height loss. Left eccentric disc bulge and endplate spurring. Moderate left mild right facet arthropathy. Resulting mild-to-moderate left foraminal stenosis with mild left subarticular recess narrowing. No significant canal or right foraminal stenosis. IMPRESSION: MR THORACIC SPINE IMPRESSION 1. Mild canal stenosis at T9-T10. Otherwise, multilevel small disc bulges without significant canal stenosis. 2. Mild multilevel foraminal stenosis in the lower thoracic spine, described above. 3. T12-L1 is discussed below. 4. Hiatal hernia. MR LUMBAR SPINE IMPRESSION 1. At T12-L1, moderate right foraminal stenosis with mild canal stenosis. Degenerative disc disease at this level with disc edema and slight left paraspinal edema, most likely degenerative/inflammatory in etiology. Infectious discitis is thought less likely but if there is clinical concern for infection then consider correlation with inflammatory markers and short interval follow-up MRI with contrast to ensure stability. 2. At L3-L4, mild-to-moderate canal stenosis. 3. At L4-L5, right eccentric degenerative change with mild-to-moderate right  subarticular recess and foraminal stenosis. 4. At L5-S1, left eccentric degenerative change with mild-to-moderate left foraminal stenosis and mild left subarticular recess narrowing. 5. Partially imaged dilated gallbladder with borderline dilated common bile duct. This could be physiologic; however, consider correlation with liver function enzymes to exclude obstruction. Right upper quadrant ultrasound could also further evaluate if clinically indicated. Electronically Signed   By: Margaretha Sheffield M.D.   On: 12/11/2020 12:29   ECHOCARDIOGRAM COMPLETE  Result Date: 12/11/2020    ECHOCARDIOGRAM REPORT   Patient Name:   Jamie Benitez Date of Exam: 12/11/2020 Medical Rec #:  [id number]       Height:       60.0 in Accession #:    [id number]      Weight:       225.0 lb Date of Birth:  Jul 31, 1945        BSA:          1.962 m Patient Age:    71 years        BP:           97/73 mmHg Patient Gender: F               HR:           102 bpm. Exam Location:  [facility] Procedure: 2D Echo, Cardiac Doppler and Color Doppler Indications:     Bacteremia R78.81  History:         Patient has no prior history of Echocardiogram examinations.  CHF, Previous Myocardial Infarction; Risk Factors:Hypertension                  and Diabetes.  Sonographer:     Sherrie Sport Referring Phys:  [id number] Tsosie Billing Diagnosing Phys: Kathlyn Sacramento MD  Sonographer Comments: Suboptimal apical window. IMPRESSIONS  1. Left ventricular ejection fraction, by estimation, is 25 to 30%. The left ventricle has severely decreased function. The left ventricle demonstrates regional wall motion abnormalities (see scoring diagram/findings for description). The left ventricular internal cavity size was mildly dilated. There is moderate left ventricular hypertrophy. Left ventricular diastolic parameters are indeterminate. There is akinesis of the left ventricular, entire inferior wall and inferolateral wall.  2. Right ventricular systolic function  is normal. The right ventricular size is normal. Tricuspid regurgitation signal is inadequate for assessing PA pressure.  3. Left atrial size was moderately dilated.  4. Right atrial size was mildly dilated.  5. The mitral valve is normal in structure. Moderate mitral valve regurgitation. No evidence of mitral stenosis.  6. The aortic valve is normal in structure. Aortic valve regurgitation is not visualized. Mild to moderate aortic valve sclerosis/calcification is present, without any evidence of aortic stenosis. Conclusion(s)/Recommendation(s): No evidence of valvular vegetations on this transthoracic echocardiogram. FINDINGS  Left Ventricle: Left ventricular ejection fraction, by estimation, is 25 to 30%. The left ventricle has severely decreased function. The left ventricle demonstrates regional wall motion abnormalities. The left ventricular internal cavity size was mildly  dilated. There is moderate left ventricular hypertrophy. Left ventricular diastolic parameters are indeterminate. Right Ventricle: The right ventricular size is normal. No increase in right ventricular wall thickness. Right ventricular systolic function is normal. Tricuspid regurgitation signal is inadequate for assessing PA pressure. Left Atrium: Left atrial size was moderately dilated. Right Atrium: Right atrial size was mildly dilated. Pericardium: There is no evidence of pericardial effusion. Mitral Valve: The mitral valve is normal in structure. Moderate mitral valve regurgitation. No evidence of mitral valve stenosis. Tricuspid Valve: The tricuspid valve is normal in structure. Tricuspid valve regurgitation is not demonstrated. No evidence of tricuspid stenosis. Aortic Valve: The aortic valve is normal in structure. Aortic valve regurgitation is not visualized. Mild to moderate aortic valve sclerosis/calcification is present, without any evidence of aortic stenosis. Aortic valve mean gradient measures 2.5 mmHg. Aortic valve peak  gradient measures 4.5 mmHg. Aortic valve area, by VTI measures 2.89 cm. Pulmonic Valve: The pulmonic valve was normal in structure. Pulmonic valve regurgitation is not visualized. No evidence of pulmonic stenosis. Aorta: The aortic root is normal in size and structure. Venous: The inferior vena cava was not well visualized. IAS/Shunts: No atrial level shunt detected by color flow Doppler.  LEFT VENTRICLE PLAX 2D LVIDd:         5.50 cm LVIDs:         4.60 cm LV PW:         1.40 cm LV IVS:        1.30 cm LVOT diam:     2.00 cm LV SV:         38 LV SV Index:   20 LVOT Area:     3.14 cm  RIGHT VENTRICLE RV Basal diam:  3.90 cm RV S prime:     11.60 cm/s TAPSE (M-mode): 3.6 cm LEFT ATRIUM             Index       RIGHT ATRIUM           Index LA diam:  5.10 cm 2.60 cm/m  RA Area:     21.20 cm LA Vol (A2C):   57.9 ml 29.50 ml/m RA Volume:   64.50 ml  32.87 ml/m LA Vol (A4C):   70.9 ml 36.13 ml/m LA Biplane Vol: 71.0 ml 36.18 ml/m  AORTIC VALVE                   PULMONIC VALVE AV Area (Vmax):    2.82 cm    PV Vmax:        0.80 m/s AV Area (Vmean):   2.90 cm    PV Peak grad:   2.6 mmHg AV Area (VTI):     2.89 cm    RVOT Peak grad: 4 mmHg AV Vmax:           105.50 cm/s AV Vmean:          76.700 cm/s AV VTI:            0.132 m AV Peak Grad:      4.5 mmHg AV Mean Grad:      2.5 mmHg LVOT Vmax:         94.60 cm/s LVOT Vmean:        70.700 cm/s LVOT VTI:          0.122 m LVOT/AV VTI ratio: 0.92  AORTA Ao Root diam: 2.70 cm MITRAL VALVE               TRICUSPID VALVE MV Area (PHT): 4.80 cm    TR Peak grad:   9.2 mmHg MV Decel Time: 158 msec    TR Vmax:        152.00 cm/s MV E velocity: 66.80 cm/s                            SHUNTS                            Systemic VTI:  0.12 m                            Systemic Diam: 2.00 cm Kathlyn Sacramento MD Electronically signed by Kathlyn Sacramento MD Signature Date/Time: 12/11/2020/4:17:31 PM    Final      Assessment: 75 year old female presenting with diffuse weakness in the  setting of multiple comorbidities including hypokalemia, CHF and AKI on CKD. She experienced a seizure this morning and Neurology was consulted. She has no prior history of seizures. Head CT shows normal appearance of the brain for age.  . 1. Exam reveals diffuse weakness rated as 3 to 4-/5, worse proximally. Also with mild drowsiness, decreased attention and mild disorientation. Minimal intermittent jaw and right chin/lower cheek twitching is not felt to be likely to seizure activity as it is non-rhythmic, lasts for only 1-2 seconds and is not associated with change in mental status.  2. Etiology for her seizure is being worked up. Could have been triggered by her combined hypocalcemia and mild hyponatremia. She has been loaded with Keppra 1000 mg IV.  3. Has been hypotensive this admission, but BP levels this AM around the time of her seizure were normal, therefore a syncopal convulsion is felt to be unlikely.  4. Worsened renal function. BUN and Cr today are 80 and 6.07, with eGFR of 7.  5. Regarding her weakness, it is most likely multifactorial, with  pain, hypokalemia, AKI and diffuse fatigue in the setting of CHF with EF of 25 to 30% and severely decreased LV function. Reflexes are of decreased amplitudes, but present. Low on the DDx but possible would be MG; will order MG panel. Doubt AIDP given presence of reflexes several days since presentation and more likely explanation of multifactorial etiology.   Recommendations: 1. EEG on Monday. EEG is not available over the weekend at [facility].  2. MRI brain (ordered).  3. Inpatient seizure precautions.  4. Ativan 2 mg IV PRN seizure and call Neurology.  5. Would discontinue Wellbutrin as well as Ultram, as either one of these medications can lower the seizure threshold.  6. Keppra useful to abort seizures acutely in this patient. However, given her impaired renal function, this would not be the optimal choice for scheduled anticonvulsant therapy. If she  is started on a DOAC due to her a-fib, then phenytoin, carbamazepine or phenobarbital would interact, making these anticonvulsants less desirable. However, there is a severe shortage of IV VPA and she cannot take PO, Therefore, will need to start her on Dilantin, with a 20 mgPE/kg IV load of fosphenytoin followed by 100 mg/kg IV phenytoin TID. Phenytoin level for tomorrow AM has been ordered.  7. Regarding #6 above, per Hospitalist she is unlikely to be placed on oral anticoagulation after IV heparin is discontinued, due to her poor compliance.  8. Discontinuing atorvastatin as one of the side effects of this medication is diffuse weakness, which is one of the symptoms she presented with.  9. MG panel has been ordered. This is likely to be low-yield.   Electronically signed: Dr. Kerney Elbe 12/13/2020, 8:25 AM

## 2020-12-13 NOTE — Progress Notes (Signed)
During shift report- patient noted to staring off into space.  She did not respond to voice commands or painful stimuli.   Neuro check performed.   MD notified during night shift due to seizure-like activity around 0615. EEG and Neurology consult was ordered.    Paged day shift MD to bedside to assess.    Stat Head CT and MRI were ordered along with IV Seizure medications.  Patient is not coherent enough to give any oral medications at this time.   Will continue to monitor

## 2020-12-13 NOTE — Procedures (Signed)
Central Venous Catheter Insertion Procedure Note  Jamie Benitez  [id number]  06/02/1945  Date:12/13/20  Time:4:23 PM   Provider Performing:Darrel Gloss Docia Barrier   Procedure: Insertion of Non-tunneled Central Venous Catheter(36556)with US guidance (02725)    Indication(s) Hemodialysis  Consent Risks of the procedure as well as the alternatives and risks of each were explained to the patient and/or caregiver.  Consent for the procedure was obtained and is signed in the bedside chart  Anesthesia Topical only with 1% lidocaine   Timeout Verified patient identification, verified procedure, site/side was marked, verified correct patient position, special equipment/implants available, medications/allergies/relevant history reviewed, required imaging and test results available.  Sterile Technique Maximal sterile technique including full sterile barrier drape, hand hygiene, sterile gown, sterile gloves, mask, hair covering, sterile ultrasound probe cover (if used).  Procedure Description Area of catheter insertion was cleaned with chlorhexidine and draped in sterile fashion.   With real-time ultrasound guidance a HD catheter was placed into the right internal jugular vein.  Nonpulsatile blood flow and easy flushing noted in all ports.  The catheter was sutured in place and sterile dressing applied.  Complications/Tolerance None; patient tolerated the procedure well. Chest X-ray is ordered to verify placement for internal jugular or subclavian cannulation.  Chest x-ray is not ordered for femoral cannulation.  EBL Minimal  Specimen(s) None  Procedures

## 2020-12-13 NOTE — Consult Note (Addendum)
Reason for Consult:Temporary right IJ dialysis catheter Referring Physician: Ralene Muskrat, MD  Jamie Benitez is an 75 y.o. female.  HPI: This is a 75 year old lady with chronic medical issues and end stage renal disease. She will need dialysis catheter for dialysis tomorrow.   Past Medical History:  Diagnosis Date   Anxiety    Bell's palsy 08/2018   CHF (congestive heart failure) (HCC)    Chronic back pain    Chronic cough    CKD (chronic kidney disease), stage III ([place])    Depression    Diabetes mellitus, type 2 ([place])    GERD (gastroesophageal reflux disease)    Hyperlipidemia    Hypertension    Multilevel degenerative disc disease    Myocardial infarction [facility]) 2007   & 2014   Neuropathy    Osteoporosis     Past Surgical History:  Procedure Laterality Date   ABDOMINAL HYSTERECTOMY     BACK SURGERY     CARDIAC CATHETERIZATION  2007   & 2014.  stents   CATARACT EXTRACTION W/PHACO Right 09/16/2019   Procedure: CATARACT EXTRACTION PHACO AND INTRAOCULAR LENS PLACEMENT (IOC) RIGHT DIABETIC 2.12  00:27.3;  Surgeon: Eulogio Bear, MD;  Location: [place];  Service: Ophthalmology;  Laterality: Right;  Diabetic - insulin and oral meds   ROTATOR CUFF REPAIR     x2    Family History  Problem Relation Age of Onset   Diabetes type II Daughter    Diabetes type II Son     Social History:  reports that she quit smoking about 8 years ago. Her smoking use included cigarettes. She has never used smokeless tobacco. She reports that she does not currently use alcohol. She reports that she does not use drugs.  Allergies:  Allergies  Allergen Reactions   Sulfa Antibiotics Rash    Mouth blisters Other reaction(s): Angioedema "whole mouth swells"   Gabapentin Diarrhea    Medications: I have reviewed the patient's current medications. Prior to Admission:  Medications Prior to Admission  Medication Sig Dispense Refill Last Dose   atorvastatin (LIPITOR) 80 MG  tablet Take 80 mg by mouth daily.   12/08/2020   buPROPion (WELLBUTRIN XL) 150 MG 24 hr tablet Take 150 mg by mouth daily.   12/08/2020   clopidogrel (PLAVIX) 75 MG tablet Take 75 mg by mouth daily.   12/08/2020   glipiZIDE (GLUCOTROL XL) 2.5 MG 24 hr tablet Take 2.5 mg by mouth daily with breakfast.   12/08/2020   HUMULIN 70/30 KWIKPEN (70-30) 100 UNIT/ML KwikPen Inject 10 Units into the skin 2 (two) times daily before a meal.   12/08/2020   hydrOXYzine (ATARAX/VISTARIL) 50 MG tablet Take 50 mg by mouth daily.   12/08/2020   isosorbide mononitrate (IMDUR) 30 MG 24 hr tablet Take 30 mg by mouth daily.   12/08/2020   lisinopril (ZESTRIL) 5 MG tablet Take 10 mg by mouth daily.   12/08/2020   meloxicam (MOBIC) 15 MG tablet Take 15 mg by mouth daily.   12/08/2020 at prn   mupirocin ointment (BACTROBAN) 2 % Place 1 application into the nose 2 (two) times daily as needed.   12/08/2020   omeprazole (PRILOSEC) 40 MG capsule Take 40 mg by mouth daily.   12/08/2020   traMADol (ULTRAM) 50 MG tablet Take 50 mg by mouth daily.   12/08/2020   traZODone (DESYREL) 50 MG tablet Take 50 mg by mouth at bedtime.   12/08/2020   Scheduled:  atorvastatin  80 mg Oral Daily   calcitRIOL  0.25 mcg Oral Daily   calcium carbonate  1,000 mg of elemental calcium Oral BID WC   chlorhexidine gluconate (MEDLINE KIT)  15 mL Mouth Rinse BID   Chlorhexidine Gluconate Cloth  6 each Topical Daily   clopidogrel  75 mg Oral Daily   hydrOXYzine  50 mg Oral Daily   insulin aspart  0-5 Units Subcutaneous QHS   insulin aspart  0-9 Units Subcutaneous TID WC   lidocaine  1 patch Transdermal Q24H   LORazepam  0.5 mg Intravenous Once   mouth rinse  15 mL Mouth Rinse 10 times per day   midodrine  10 mg Oral TID WC   pantoprazole  40 mg Oral Daily   phenytoin (DILANTIN) IV  100 mg Intravenous Q8H   traZODone  50 mg Oral QHS   Continuous:  sodium chloride 75 mL/hr at 12/12/20 1941   sodium chloride 75 mL/hr (12/13/20 1009)   amiodarone 30 mg/hr  (12/13/20 1426)   calcium gluconate      ceFAZolin (ANCEF) IV     fosPHENYtoin (CEREBYX) IV     ZOX:[id number], albuterol, HYDROmorphone (DILAUDID) injection, LORazepam, methocarbamol, metoprolol tartrate, mupirocin ointment, oxyCODONE Anti-infectives (From admission, onward)    Start     Dose/Rate Route Frequency Ordered Stop   12/13/20 2225  ceFAZolin (ANCEF) IVPB 1 g/50 mL premix        1 g 100 mL/hr over 30 Minutes Intravenous Every 24 hours 12/13/20 0943     12/09/20 2000  ceFAZolin (ANCEF) IVPB 2g/100 mL premix  Status:  Discontinued        2 g 200 mL/hr over 30 Minutes Intravenous Every 12 hours 12/09/20 1803 12/13/20 0943       Results for orders placed or performed during the hospital encounter of 12/09/20 (from the past 48 hour(s))  Heparin level (unfractionated)     Status: Abnormal   Collection Time: 12/11/20  6:15 PM  Result Value Ref Range   Heparin Unfractionated 0.21 (L) 0.30 - 0.70 IU/mL    Comment: (NOTE) The clinical reportable range upper limit is being lowered to >1.10 to align with the FDA approved guidance for the current laboratory assay.  If heparin results are below expected values, and patient dosage has  been confirmed, suggest follow up testing of antithrombin III levels. Performed at [facility], [address] [place], [place] [address]   Comprehensive metabolic panel     Status: Abnormal   Collection Time: 12/11/20  6:15 PM  Result Value Ref Range   Sodium 128 (L) 135 - 145 mmol/L   Potassium 3.5 3.5 - 5.1 mmol/L    Comment: HEMOLYSIS AT THIS LEVEL MAY AFFECT RESULT   Chloride 93 (L) 98 - 111 mmol/L   CO2 20 (L) 22 - 32 mmol/L   Glucose, Bld 97 70 - 99 mg/dL    Comment: Glucose reference range applies only to samples taken after fasting for at least 8 hours.   BUN 70 (H) 8 - 23 mg/dL   Creatinine, Ser 5.28 (H) 0.44 - 1.00 mg/dL   Calcium 6.4 (LL) 8.9 - 10.3 mg/dL    Comment: CRITICAL RESULT CALLED TO, READ BACK BY AND  VERIFIED WITH JOSIAS MOTIEL @2002  12/11/20 MJU    Total Protein 4.2 (L) 6.5 - 8.1 g/dL   Albumin 1.8 (L) 3.5 - 5.0 g/dL   AST 101 (H) 15 - 41 U/L   ALT 11 0 - 44 U/L  Alkaline Phosphatase 93 38 - 126 U/L   Total Bilirubin 0.7 0.3 - 1.2 mg/dL   GFR, Estimated 8 (L) >60 mL/min    Comment: (NOTE) Calculated using the CKD-EPI Creatinine Equation (2021)    Anion gap 15 5 - 15    Comment: Performed at [facility], [address] [place], [place] [address]  Glucose, capillary     Status: Abnormal   Collection Time: 12/11/20 10:01 PM  Result Value Ref Range   Glucose-Capillary 106 (H) 70 - 99 mg/dL    Comment: Glucose reference range applies only to samples taken after fasting for at least 8 hours.  CBC     Status: Abnormal   Collection Time: 12/12/20  3:55 AM  Result Value Ref Range   WBC 16.0 (H) 4.0 - 10.5 K/uL   RBC 3.49 (L) 3.87 - 5.11 MIL/uL   Hemoglobin 9.8 (L) 12.0 - 15.0 g/dL   HCT 27.5 (L) 36.0 - 46.0 %   MCV 78.8 (L) 80.0 - 100.0 fL   MCH 28.1 26.0 - 34.0 pg   MCHC 35.6 30.0 - 36.0 g/dL   RDW 15.4 11.5 - 15.5 %   Platelets 155 150 - 400 K/uL   nRBC 0.0 0.0 - 0.2 %    Comment: Performed at [facility], [address] [place], [place] [address]  Heparin level (unfractionated)     Status: None   Collection Time: 12/12/20  3:55 AM  Result Value Ref Range   Heparin Unfractionated 0.47 0.30 - 0.70 IU/mL    Comment: (NOTE) The clinical reportable range upper limit is being lowered to >1.10 to align with the FDA approved guidance for the current laboratory assay.  If heparin results are below expected values, and patient dosage has  been confirmed, suggest follow up testing of antithrombin III levels. Performed at [facility], [address] [place], [place] [address]   Glucose, capillary     Status: None   Collection Time: 12/12/20  7:40 AM  Result Value Ref Range   Glucose-Capillary 94 70 - 99 mg/dL    Comment: Glucose reference  range applies only to samples taken after fasting for at least 8 hours.  Heparin level (unfractionated)     Status: None   Collection Time: 12/12/20 11:58 AM  Result Value Ref Range   Heparin Unfractionated 0.41 0.30 - 0.70 IU/mL    Comment: (NOTE) The clinical reportable range upper limit is being lowered to >1.10 to align with the FDA approved guidance for the current laboratory assay.  If heparin results are below expected values, and patient dosage has  been confirmed, suggest follow up testing of antithrombin III levels. Performed at [facility], [address] [place], [place] [address]   CBC with Differential/Platelet     Status: Abnormal   Collection Time: 12/12/20 11:58 AM  Result Value Ref Range   WBC 16.0 (H) 4.0 - 10.5 K/uL   RBC 3.50 (L) 3.87 - 5.11 MIL/uL   Hemoglobin 9.8 (L) 12.0 - 15.0 g/dL   HCT 27.1 (L) 36.0 - 46.0 %   MCV 77.4 (L) 80.0 - 100.0 fL   MCH 28.0 26.0 - 34.0 pg   MCHC 36.2 (H) 30.0 - 36.0 g/dL   RDW 15.2 11.5 - 15.5 %   Platelets 173 150 - 400 K/uL   nRBC 0.0 0.0 - 0.2 %   Neutrophils Relative % 88 %   Neutro Abs 14.1 (H) 1.7 - 7.7 K/uL  Lymphocytes Relative 6 %   Lymphs Abs 0.9 0.7 - 4.0 K/uL   Monocytes Relative 4 %   Monocytes Absolute 0.7 0.1 - 1.0 K/uL   Eosinophils Relative 1 %   Eosinophils Absolute 0.2 0.0 - 0.5 K/uL   Basophils Relative 0 %   Basophils Absolute 0.1 0.0 - 0.1 K/uL   WBC Morphology MORPHOLOGY UNREMARKABLE    RBC Morphology MORPHOLOGY UNREMARKABLE    Smear Review Normal platelet morphology    Immature Granulocytes 1 %   Abs Immature Granulocytes 0.08 (H) 0.00 - 0.07 K/uL    Comment: Performed at [facility], [address] [place], [place] [address]  Comprehensive metabolic panel     Status: Abnormal   Collection Time: 12/12/20 11:58 AM  Result Value Ref Range   Sodium 128 (L) 135 - 145 mmol/L   Potassium 2.8 (L) 3.5 - 5.1 mmol/L   Chloride 82 (L) 98 - 111 mmol/L   CO2 27 22 - 32 mmol/L    Glucose, Bld 105 (H) 70 - 99 mg/dL    Comment: Glucose reference range applies only to samples taken after fasting for at least 8 hours.   BUN 75 (H) 8 - 23 mg/dL   Creatinine, Ser 5.06 (H) 0.44 - 1.00 mg/dL   Calcium 6.0 (LL) 8.9 - 10.3 mg/dL    Comment: CRITICAL RESULT CALLED TO, READ BACK BY AND VERIFIED WITH KIMBERLY SMYTHE RN @1237  12/12/20 SCS    Total Protein 4.8 (L) 6.5 - 8.1 g/dL   Albumin 2.0 (L) 3.5 - 5.0 g/dL   AST 101 (H) 15 - 41 U/L   ALT 7 0 - 44 U/L   Alkaline Phosphatase 110 38 - 126 U/L   Total Bilirubin 0.7 0.3 - 1.2 mg/dL   GFR, Estimated 9 (L) >60 mL/min    Comment: (NOTE) Calculated using the CKD-EPI Creatinine Equation (2021)    Anion gap 19 (H) 5 - 15    Comment: Performed at [facility], [address] [place], [place] [address]  Glucose, capillary     Status: None   Collection Time: 12/12/20 11:59 AM  Result Value Ref Range   Glucose-Capillary 93 70 - 99 mg/dL    Comment: Glucose reference range applies only to samples taken after fasting for at least 8 hours.  Glucose, capillary     Status: None   Collection Time: 12/12/20  5:31 PM  Result Value Ref Range   Glucose-Capillary 98 70 - 99 mg/dL    Comment: Glucose reference range applies only to samples taken after fasting for at least 8 hours.  Glucose, capillary     Status: Abnormal   Collection Time: 12/12/20  8:59 PM  Result Value Ref Range   Glucose-Capillary 120 (H) 70 - 99 mg/dL    Comment: Glucose reference range applies only to samples taken after fasting for at least 8 hours.  Glucose, capillary     Status: Abnormal   Collection Time: 12/13/20  6:27 AM  Result Value Ref Range   Glucose-Capillary 136 (H) 70 - 99 mg/dL    Comment: Glucose reference range applies only to samples taken after fasting for at least 8 hours.  Heparin level (unfractionated)     Status: Abnormal   Collection Time: 12/13/20  7:30 AM  Result Value Ref Range   Heparin Unfractionated 0.80 (H) 0.30 - 0.70 IU/mL     Comment: (NOTE) The clinical reportable range upper limit is being lowered to >1.10 to align with the  FDA approved guidance for the current laboratory assay.  If heparin results are below expected values, and patient dosage has  been confirmed, suggest follow up testing of antithrombin III levels. Performed at [facility], [address] [place], [place] [address]   CBC     Status: Abnormal   Collection Time: 12/13/20  7:30 AM  Result Value Ref Range   WBC 17.6 (H) 4.0 - 10.5 K/uL   RBC 3.58 (L) 3.87 - 5.11 MIL/uL   Hemoglobin 10.0 (L) 12.0 - 15.0 g/dL   HCT 28.6 (L) 36.0 - 46.0 %   MCV 79.9 (L) 80.0 - 100.0 fL   MCH 27.9 26.0 - 34.0 pg   MCHC 35.0 30.0 - 36.0 g/dL   RDW 15.9 (H) 11.5 - 15.5 %   Platelets 155 150 - 400 K/uL   nRBC 0.0 0.0 - 0.2 %    Comment: Performed at [facility], [address], [place], [place] [address]  Basic metabolic panel     Status: Abnormal   Collection Time: 12/13/20  7:30 AM  Result Value Ref Range   Sodium 128 (L) 135 - 145 mmol/L    Comment: ELECTROLYTES REPEATED TO VERIFY SCS   Potassium 3.7 3.5 - 5.1 mmol/L   Chloride 86 (L) 98 - 111 mmol/L   CO2 19 (L) 22 - 32 mmol/L   Glucose, Bld 143 (H) 70 - 99 mg/dL    Comment: Glucose reference range applies only to samples taken after fasting for at least 8 hours.   BUN 80 (H) 8 - 23 mg/dL   Creatinine, Ser 6.07 (H) 0.44 - 1.00 mg/dL   Calcium 6.3 (LL) 8.9 - 10.3 mg/dL    Comment: CRITICAL RESULT CALLED TO, READ BACK BY AND VERIFIED WITH JENNIFER STEVENS RN @0826  12/13/20 SCS    GFR, Estimated 7 (L) >60 mL/min    Comment: (NOTE) Calculated using the CKD-EPI Creatinine Equation (2021)    Anion gap 23 (H) 5 - 15    Comment: Performed at [facility], [address] [place], [place] [address]  Blood gas, arterial     Status: Abnormal   Collection Time: 12/13/20  8:15 AM  Result Value Ref Range   FIO2 0.28    pH, Arterial 7.38 7.350 - 7.450   pCO2 arterial 41  32.0 - 48.0 mmHg   pO2, Arterial 56 (L) 83.0 - 108.0 mmHg   Bicarbonate 24.3 20.0 - 28.0 mmol/L   Acid-base deficit 0.8 0.0 - 2.0 mmol/L   O2 Saturation 88.1 %   Patient temperature 37.0    Collection site LEFT RADIAL    Sample type ARTERIAL DRAW    Allens test (pass/fail) PASS PASS    Comment: Performed at [facility], [address] [place], [place] [address]  Glucose, capillary     Status: Abnormal   Collection Time: 12/13/20  8:24 AM  Result Value Ref Range   Glucose-Capillary 140 (H) 70 - 99 mg/dL    Comment: Glucose reference range applies only to samples taken after fasting for at least 8 hours.  Glucose, capillary     Status: None   Collection Time: 12/13/20 11:54 AM  Result Value Ref Range   Glucose-Capillary 78 70 - 99 mg/dL    Comment: Glucose reference range applies only to samples taken after fasting for at least 8 hours.    CT HEAD WO CONTRAST (5MM)  Result Date: 12/13/2020 CLINICAL DATA:  75 year old female with new onset drooling, altered  mental status. EXAM: CT HEAD WITHOUT CONTRAST TECHNIQUE: Contiguous axial images were obtained from the base of the skull through the vertex without intravenous contrast. COMPARISON:  Head CT 07/06/2012. FINDINGS: Study is mildly degraded by motion artifact despite repeated imaging attempts. Brain: Cerebral volume is not significantly changed since 2014 and is within normal limits for age. No midline shift, ventriculomegaly, mass effect, evidence of mass lesion, intracranial hemorrhage or evidence of cortically based acute infarction. Allowing for mild motion gray-white matter differentiation is within normal limits for age throughout the brain. Vascular: Mild Calcified atherosclerosis at the skull base. No suspicious intracranial vascular hyperdensity. Skull: No acute osseous abnormality identified. Chronic anterior C1-C2 degeneration. Sinuses/Orbits: Chronic left maxillary sinusitis with mild improvement since 2014. Other  Visualized paranasal sinuses and mastoids are clear. Other: No acute orbit or scalp soft tissue finding. IMPRESSION: 1. Normal for age non contrast CT appearance of the brain when allowing for mildly motion degraded study. 2. Chronic left maxillary sinusitis. Electronically Signed   By: Genevie Ann M.D.   On: 12/13/2020 09:08   MR ANGIO HEAD WO CONTRAST  Result Date: 12/13/2020 CLINICAL DATA:  Seizure, abnormal neuro exam. EXAM: MRA HEAD WITHOUT CONTRAST TECHNIQUE: Angiographic images of the Circle of Willis were acquired using MRA technique without intravenous contrast. COMPARISON:  Same-day brain MRI 12/13/2020. FINDINGS: Mildly motion degraded examination. Anterior circulation: The intracranial internal carotid arteries are patent. The M1 middle cerebral arteries are patent. Atherosclerotic irregularity of the M2 and more distal middle cerebral artery vessels bilaterally. Most notably, there are sites of severe stenosis within mid M2 left MCA vessels. The anterior cerebral arteries are patent. 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 Posterior circulation: The intracranial vertebral arteries are patent. The basilar artery is patent. The posterior cerebral arteries are patent. P1 PCA segments are hypoplastic bilaterally with superimposed high-grade stenoses. However, there are sizable bilateral posterior communicating arteries which are widely patent. Severe stenosis within the proximal P2 right PCA. Severe right PCA distal branch atherosclerotic irregularity. Anatomic variants: As described IMPRESSION: Intracranial atherosclerotic disease with multifocal stenoses, as outlined and with findings most notably as follows. Sites of severe stenosis within mid M2 left MCA vessels. The P1 posterior cerebral artery segments are hypoplastic with superimposed severe stenoses bilaterally. However, there are sizable bilateral  posterior communicating arteries which are widely patent. Severe stenosis within the proximal P2 right PCA. Severe right PCA distal branch atherosclerotic irregularity. 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 Electronically Signed   By: Kellie Simmering D.O.   On: 12/13/2020 14:45   MR BRAIN WO CONTRAST  Result Date: 12/13/2020 CLINICAL DATA:  Seizure, abnormal neuro exam. EXAM: MRI HEAD WITHOUT CONTRAST TECHNIQUE: Multiplanar, multiecho pulse sequences of the brain and surrounding structures were obtained without intravenous contrast. COMPARISON:  Prior head CT examinations 12/13/2020 and earlier. FINDINGS: Brain: Mild intermittent motion degradation. Mild generalized cerebral and cerebellar atrophy. 8 mm acute infarct within the left parietal white matter (series 6, image 27). Additional suspected punctate acute infarct within the left parietal cortex. Mild multifocal T2/FLAIR hyperintense signal abnormality within the cerebral white matter, nonspecific but compatible with chronic small vessel ischemic disease. Small chronic lacunar infarct within the right thalamus. Small chronic lacunar infarcts within the right cerebellar hemisphere. The hippocampi are symmetric in size and signal. No evidence of an intracranial mass. No chronic intracranial blood products. No extra-axial fluid collection. No  midline shift. Vascular: Maintained flow voids within the proximal large arterial vessels. Skull and upper cervical spine: No focal suspicious marrow lesion. Sinuses/Orbits: Left optic nerve atrophy. Right lens replacement. Moderate mucosal thickening within an asymmetrically diminutive left maxillary sinus with associated chronic reactive osteitis. Trace mucosal thickening within the bilateral ethmoid air cells. IMPRESSION: Mildly motion degraded exam. 8 mm acute infarct within the left parietal white matter. Additional suspected punctate acute infarct within the left  parietal cortex. Background mild chronic small-vessel ischemic changes within the cerebral white matter. Small chronic lacunar infarcts within the left thalamus and right cerebellar hemisphere. Mild generalized parenchymal atrophy. Left optic nerve atrophy. Paranasal sinus disease, most notably moderate left maxillary sinusitis. Electronically Signed   By: Kellie Simmering D.O.   On: 12/13/2020 14:37   DG Chest Port 1 View  Result Date: 12/13/2020 CLINICAL DATA:  75 year old female with hypoxia. EXAM: PORTABLE CHEST 1 VIEW COMPARISON:  Portable chest 11/21/2020 and earlier. FINDINGS: Portable AP upright view at 0806 hours. Progressive left lung base opacification, obscuring the left hemidiaphragm. Stable cardiac size and mediastinal contours. Right lung appears stable. And pulmonary vascularity has not significantly changed since 12/09/2020. No overt edema. No acute osseous abnormality identified. Paucity of bowel gas in the upper abdomen. Chronic degeneration and postoperative changes to the right shoulder. IMPRESSION: Confluent left lung base opacification since 12/09/2020. Consider left lower lobe pneumonia and/or increasing pleural effusion. Electronically Signed   By: Genevie Ann M.D.   On: 12/13/2020 09:13    Review of Systems  All other systems reviewed and are negative. Blood pressure (!) 114/52, pulse 67, temperature 98.4 F (36.9 C), resp. rate 17, height 5' (1.524 m), weight 98.3 kg, SpO2 100 %. Physical Exam Vitals and nursing note reviewed.  Constitutional:      Appearance: Normal appearance. She is obese.  HENT:     Head: Normocephalic and atraumatic.     Nose: Nose normal.     Mouth/Throat:     Mouth: Mucous membranes are moist.     Pharynx: Oropharynx is clear.  Eyes:     Extraocular Movements: Extraocular movements intact.     Conjunctiva/sclera: Conjunctivae normal.     Pupils: Pupils are equal, round, and reactive to light.  Cardiovascular:     Rate and Rhythm: Normal rate and  regular rhythm.  Abdominal:     General: Bowel sounds are normal.     Palpations: Abdomen is soft.  Musculoskeletal:        General: Normal range of motion.     Cervical back: Normal range of motion and neck supple.  Skin:    General: Skin is warm and dry.     Capillary Refill: Capillary refill takes less than 2 seconds.  Neurological:     General: No focal deficit present.     Mental Status: She is alert and oriented to person, place, and time. Mental status is at baseline.  Psychiatric:        Mood and Affect: Mood normal.        Behavior: Behavior normal.        Thought Content: Thought content normal.    Assessment/Plan: Patient with end stage renal disease and need for temporary dialysis catheter for HD. I have consented the patient and she agrees to dialysis catheter placement.   Elmore Guise 12/13/2020, 4:16 PM

## 2020-12-13 NOTE — Progress Notes (Addendum)
Patient crying out in pain.  Paged MD to see if patient can get anything IV for it.    0.5 mg of dilaudid was order and to be given sparingly.   Use of lidocaine patch and heating pads recommend.    Gave 1 dose of 0.5 mg

## 2020-12-13 NOTE — Progress Notes (Signed)
At approximately 0615, phlebotomist approached pt's primary RN & informed her that the pt was "drooling"; and was that normal? Upon entering pt's room, Pt was noted to have foam around her mouth, eyes rolled up in her head. VS obtained, 130/65, HR 80 (remains in a-fib), SpO2 98% on 2L [place], CBG 136. Pt not responsive to painful stimuli, but would open eyes and not focus on anything in the room in particular. Rapid response called by other RN's, ICU CN and AC arrived and were informed of situation. On call provider messaged, orders received.

## 2020-12-13 NOTE — Progress Notes (Signed)
PROGRESS NOTE    Jamie Benitez  SWH:[id number] DOB: [birth year] DOA: 12/09/2020 PCP: Derinda Late, MD    Brief Narrative:  75 y.o. female with medical history significant of sCHF with EF 35%, CAD with stent placement, CKD-3A, chronic back pain, Bell's palsy, HTN, hyperlipidemia, diabetes mellitus, GERD, depression with anxiety, who presents with weakness.   Patient states that she has generalized weakness for more than 4 days.  No unilateral numbness or tingling to extremities.  No facial droop or slurred speech.  Patient has dry cough, denies chest pain or shortness breath.  No fever or chills.  Patient states that she had diarrhea in the past several days, which has resolved.  Currently no nausea, vomiting, diarrhea or abdominal pain.  No symptoms of UTI.  Patient complains of chronic lower back pain. Initially patient had oxygen desaturation to mid 80s, which improved to 98% on room air in ED.   Initial trop 105 with ST depression and T wave inversion in inferior leads and anterior leads.  Dr. Saunders Revel of cardiology was consulted, who did not think patient had STEMI. He recommended [facility] cardiology consult. IV heparin is started in ED  Cardiology patient's presentation is inconsistent with NSTEMI.  No significant delta in troponin.  VQ scan and lower extremity duplex negative for VTE.  Patient did have elements of mild BNP elevation and small left pleural effusion possible decompensated heart failure.  Was not given diuretics due to elevated kidney function.  Subsequently patient went into a tachyarrhythmia likely atrial fibrillation which she does not have a history of.  Also creatinine worsened from 2-4.2.  Nephrology consult 9/22.  9/23: Patient was moved to stepdown unit for closer monitoring due to persistent hypotension.  Not requiring vasopressor support.  Remains on amiodarone.  Remains in atrial fibrillation, rate control improved.  Kidney function continues to deteriorate.  Discussed with  nephrology.  Patient still undecided about initiation of hemodialysis  9/25: Early this morning patient had RRT called for decreased level of mentation and possible seizure activity.  Neurology consult requested.  Stat CT head negative for mass or bleed.  ABG with mild hypoxia but otherwise reassuring.  Neurology ordering phenytoin and routine EEG as well as MRI brain.  Patient remains encephalopathic and unable to provide history.   Assessment & Plan:   Principal Problem:   NSTEMI (non-ST elevated myocardial infarction) ([place]) Active Problems:   Chronic systolic CHF (congestive heart failure) (HCC)   Chronic back pain   Acute renal failure superimposed on stage 3a chronic kidney disease (HCC)   Depression with anxiety   Type II diabetes mellitus with renal manifestations (HCC)   CAD (coronary artery disease)   HLD (hyperlipidemia)   HTN (hypertension)   Hypokalemia   Leukocytosis   Acute decompensated heart failure (HCC)   Pressure injury of skin  Possible new onset seizure Patient had shaking activity and encephalopathy noted early 9/25 Seizure activity versus uremic encephalopathy primary differentials Neurology consulted CT head negative ABG with mild hypoxia otherwise reassuring Plan: Phenytoin per neurology recommendations Spot EEG MRI brain Seizure precautions  Acute renal failure superimposed on stage 3a chronic kidney disease ([place]):  Metabolic acidosis  Hyponatremia recent baseline creatinine 1.2 on 10/28/2019.   Her creatinine is at 2.02, BUN 35 on admission Creatinine progressively worsening Nephrology consulted Plan: Patient's urine output has been minimal despite resuscitation with intravenous fluids.  Creatinine continues to worsen.  There is a possibility of uremic encephalopathy driving the decreased mentation.  Neurology to follow-up  regarding hemodialysis initiation.  Patient has been reluctant in the past.  Possible MSSA bacteremia Unclear what this this  represents infection or true contaminant Does not meet sepsis criteria No clear source identified TTE negative MRI spine negative Plan: Continue Ancef Patient's mental status improves will request TEE from cardiology  NSTEMI and history of CAD: S/p of stent placement  trop  105 --> 93 --> 93.   Patient does not have chest pain, but has generalized weakness.   Dr. Clayborn Bigness of cardiology is consulted.   D-dimer is positive, but VQ scan negative for PE.   Lower extremity Dopplers negative for DVT. Per cardiology patient's presentation is inconsistent with NSTEMI Plan: Heparin GTT on hold for now Continue antiplatelet therapy High intensity statin Telemetry monitoring  New onset atrial fibrillation with rapid ventricular response Noted on telemetry 9/22 Ventricular rates up to 130s Plan: Patient unable to tolerate p.o. Continue amiodarone gtt. for today Transition to p.o. amiodarone when able to tolerate  Chronic systolic CHF (congestive heart failure) ([place]) 2D echo on 05/22/2017 showed EF of 35.  Patient has 1+ leg edema, elevated BNP 398, indicating possible fluid overload.   Since patient has worsening renal function, will not start diuretics now -Watch volume status closely -Nephrology recommending gentle IV fluids for deteriorating kidney function   Chronic back pain -As needed Tylenol -continue home tramadol  Depression with anxiety -Continue home medications   Type II diabetes mellitus with renal manifestations (HCC) Recent A1c 5.8, well controlled.   Patient is taking 70/30 NPH insulin and glucose at home. -Sliding scale insulin -Continue 70/30 NPH 8 units twice daily.  Monitor carefully for hypoglycemia   HLD (hyperlipidemia) -Lipitor   HTN (hypertension) -Hold lisinopril -IV hydralazine as needed -Imdur on hold   Hypokalemia Monitor and replace as necessary Maintain K greater than 4, mag greater than 2   Possible bile duct dilatation Noted partial on  MRI LFTs normal Defer biliary tract imaging for now  DVT prophylaxis: Heparin GTT Code Status: DNR Family Communication: Jamie Benitez [phone] on 9/22.  Daughter at bedside 9/25 Disposition Plan: Status is: Inpatient  Remains inpatient appropriate because:Inpatient level of care appropriate due to severity of illness  Dispo: The patient is from: Home              Anticipated d/c is to: SNF              Patient currently is not medically stable to d/c.   Difficult to place patient No  Patient with numerous acute issues requiring hospitalization.  Disposition plan pending.     Level of care: Progressive Cardiac  Consultants:  Nephrology Cardiology  Procedures:  None  Antimicrobials:  Cefazolin   Subjective: Patient seen and examined.  Encephalopathic this morning.  Does not communicate.  Unable to follow commands.  Objective: Vitals:   12/12/20 2324 12/13/20 0032 12/13/20 0503 12/13/20 0829  BP: (!) 155/72 (!) 126/48 140/77 117/74  Pulse: 82 81 87 84  Resp: (!) 21 17 16 17   Temp: 98.2 F (36.8 C) 98.6 F (37 C) 97.8 F (36.6 C) 97.9 F (36.6 C)  TempSrc: Oral     SpO2: 97% 96% 98% 90%  Weight:   98.3 kg   Height:        Intake/Output Summary (Last 24 hours) at 12/13/2020 1143 Last data filed at 12/13/2020 0953 Gross per 24 hour  Intake 3096.58 ml  Output 302 ml  Net 2794.58 ml   Filed Weights   12/09/20  0354 12/13/20 0503  Weight: 102.1 kg 98.3 kg    Examination:  General exam: Encephalopathic.  Unable to communicate Respiratory system: Poor respiratory effort.  Lungs clear.  Normal work of breathing.  2 L Cardiovascular system:  S1-S2, regular rate, irregular rhythm, no murmurs Gastrointestinal system: Soft, nontender, nondistended, normal bowel sounds Central nervous system: Encephalopathic.  Unable to provide history Extremities: Unable to assess power and range of motion Skin: No rashes, lesions or ulcers Psychiatry: Unable to  assess    Data Reviewed: I have personally reviewed following labs and imaging studies  CBC: Recent Labs  Lab 12/09/20 0415 12/10/20 0721 12/12/20 0355 12/12/20 1158 12/13/20 0730  WBC 11.4* 13.9* 16.0* 16.0* 17.6*  NEUTROABS 9.1*  --   --  14.1*  --   HGB 12.3 10.7* 9.8* 9.8* 10.0*  HCT 37.6 33.0* 27.5* 27.1* 28.6*  MCV 86.0 84.2 78.8* 77.4* 79.9*  PLT 190 150 155 173 295   Basic Metabolic Panel: Recent Labs  Lab 12/09/20 0415 12/10/20 0721 12/10/20 2318 12/11/20 1815 12/12/20 1158 12/13/20 0730  NA 134* 132* 130* 128* 128* 128*  K 3.4* 3.8 3.8 3.5 2.8* 3.7  CL 100 98 100 93* 82* 86*  CO2 22 18* 18* 20* 27 19*  GLUCOSE 238* 113* 128* 97 105* 143*  BUN 35* 57* 65* 70* 75* 80*  CREATININE 2.02* 4.21* 5.17* 5.28* 5.06* 6.07*  CALCIUM 8.5* 8.0* 7.3* 6.4* 6.0* 6.3*  MG 1.8  --   --   --   --   --    GFR: Estimated Creatinine Clearance: 8.7 mL/min (A) (by C-G formula based on SCr of 6.07 mg/dL (H)). Liver Function Tests: Recent Labs  Lab 12/09/20 0415 12/11/20 0627 12/11/20 1815 12/12/20 1158  AST 37 76* 101* 101*  ALT 24 13 11 7   ALKPHOS 85 82 93 110  BILITOT 1.2 0.7 0.7 0.7  PROT 6.9 5.3* 4.2* 4.8*  ALBUMIN 3.5 2.3*  2.4* 1.8* 2.0*   Recent Labs  Lab 12/09/20 0415  LIPASE 35   No results for input(s): AMMONIA in the last 168 hours. Coagulation Profile: Recent Labs  Lab 12/09/20 0616  INR 1.1   Cardiac Enzymes: No results for input(s): CKTOTAL, CKMB, CKMBINDEX, TROPONINI in the last 168 hours. BNP (last 3 results) No results for input(s): PROBNP in the last 8760 hours. HbA1C: No results for input(s): HGBA1C in the last 72 hours.  CBG: Recent Labs  Lab 12/12/20 1159 12/12/20 1731 12/12/20 2059 12/13/20 0627 12/13/20 0824  GLUCAP 93 98 120* 136* 140*   Lipid Profile: No results for input(s): CHOL, HDL, LDLCALC, TRIG, CHOLHDL, LDLDIRECT in the last 72 hours.  Thyroid Function Tests: Recent Labs    12/11/20 0627  TSH 2.222    Anemia Panel: No results for input(s): VITAMINB12, FOLATE, FERRITIN, TIBC, IRON, RETICCTPCT in the last 72 hours. Sepsis Labs: Recent Labs  Lab 12/09/20 0434 12/11/20 0627 12/11/20 0832 12/11/20 1040  LATICACIDVEN 1.8 1.2 1.3 1.7    Recent Results (from the past 240 hour(s))  Resp Panel by RT-PCR (Flu A&B, Covid) Nasopharyngeal Swab     Status: None   Collection Time: 12/09/20  4:34 AM   Specimen: Nasopharyngeal Swab; Nasopharyngeal(NP) swabs in vial transport medium  Result Value Ref Range Status   SARS Coronavirus 2 by RT PCR NEGATIVE NEGATIVE Final    Comment: (NOTE) SARS-CoV-2 target nucleic acids are NOT DETECTED.  The SARS-CoV-2 RNA is generally detectable in upper respiratory specimens during the acute phase of infection.  The lowest concentration of SARS-CoV-2 viral copies this assay can detect is 138 copies/mL. A negative result does not preclude SARS-Cov-2 infection and should not be used as the sole basis for treatment or other patient management decisions. A negative result may occur with  improper specimen collection/handling, submission of specimen other than nasopharyngeal swab, presence of viral mutation(s) within the areas targeted by this assay, and inadequate number of viral copies(<138 copies/mL). A negative result must be combined with clinical observations, patient history, and epidemiological information. The expected result is Negative.  Fact Sheet for Patients:  EntrepreneurPulse.com.au  Fact Sheet for Healthcare Providers:  IncredibleEmployment.be  This test is no t yet approved or cleared by the [place] FDA and  has been authorized for detection and/or diagnosis of SARS-CoV-2 by FDA under an Emergency Use Authorization (EUA). This EUA will remain  in effect (meaning this test can be used) for the duration of the COVID-19 declaration under Section 564(b)(1) of the Act, 21 U.S.C.section 360bbb-3(b)(1),  unless the authorization is terminated  or revoked sooner.       Influenza A by PCR NEGATIVE NEGATIVE Final   Influenza B by PCR NEGATIVE NEGATIVE Final    Comment: (NOTE) The Xpert Xpress SARS-CoV-2/FLU/RSV plus assay is intended as an aid in the diagnosis of influenza from Nasopharyngeal swab specimens and should not be used as a sole basis for treatment. Nasal washings and aspirates are unacceptable for Xpert Xpress SARS-CoV-2/FLU/RSV testing.  Fact Sheet for Patients: EntrepreneurPulse.com.au  Fact Sheet for Healthcare Providers: IncredibleEmployment.be  This test is not yet approved or cleared by the [place] FDA and has been authorized for detection and/or diagnosis of SARS-CoV-2 by FDA under an Emergency Use Authorization (EUA). This EUA will remain in effect (meaning this test can be used) for the duration of the COVID-19 declaration under Section 564(b)(1) of the Act, 21 U.S.C. section 360bbb-3(b)(1), unless the authorization is terminated or revoked.  Performed at [facility], [address] [place], [place] [address]   Blood culture (routine single)     Status: Abnormal   Collection Time: 12/09/20  4:34 AM   Specimen: BLOOD  Result Value Ref Range Status   Specimen Description   Final    BLOOD LEFT ARM Performed at [facility], [address], [place], [place] [address]    Special Requests   Final    BOTTLES DRAWN AEROBIC AND ANAEROBIC Blood Culture adequate volume Performed at [facility] [place], [place] [address]    Culture  Setup Time   Final    GRAM POSITIVE COCCI IN BOTH AEROBIC AND ANAEROBIC BOTTLES CRITICAL RESULT CALLED TO, READ BACK BY AND VERIFIED WITH: Aldona Bar RAEUR AT 5284 ON 12/09/20 BY SS Performed at [facility], [employer] [address], [place],  [address]    Culture STAPHYLOCOCCUS AUREUS (A)  Final   Report Status 12/11/2020 FINAL  Final    Organism ID, Bacteria STAPHYLOCOCCUS AUREUS  Final      Susceptibility   Staphylococcus aureus - MIC*    CIPROFLOXACIN 1 SENSITIVE Sensitive     ERYTHROMYCIN <=0.25 SENSITIVE Sensitive     GENTAMICIN <=0.5 SENSITIVE Sensitive     OXACILLIN 0.5 SENSITIVE Sensitive     TETRACYCLINE <=1 SENSITIVE Sensitive     VANCOMYCIN <=0.5 SENSITIVE Sensitive     TRIMETH/SULFA <=10 SENSITIVE Sensitive     CLINDAMYCIN <=0.25 SENSITIVE Sensitive     RIFAMPIN <=0.5 SENSITIVE Sensitive     Inducible Clindamycin NEGATIVE  Sensitive     * STAPHYLOCOCCUS AUREUS  Blood Culture ID Panel (Reflexed)     Status: Abnormal   Collection Time: 12/09/20  4:34 AM  Result Value Ref Range Status   Enterococcus faecalis NOT DETECTED NOT DETECTED Final   Enterococcus Faecium NOT DETECTED NOT DETECTED Final   Listeria monocytogenes NOT DETECTED NOT DETECTED Final   Staphylococcus species DETECTED (A) NOT DETECTED Final    Comment: CRITICAL RESULT CALLED TO, READ BACK BY AND VERIFIED WITH: SAMANTHA RAEUR AT 1750 ON 12/09/20 BY SS    Staphylococcus aureus (BCID) DETECTED (A) NOT DETECTED Final    Comment: CRITICAL RESULT CALLED TO, READ BACK BY AND VERIFIED WITH: SAMANTHA RAEUR AT 1750 ON 12/09/20 BY SS    Staphylococcus epidermidis NOT DETECTED NOT DETECTED Final   Staphylococcus lugdunensis NOT DETECTED NOT DETECTED Final   Streptococcus species NOT DETECTED NOT DETECTED Final   Streptococcus agalactiae NOT DETECTED NOT DETECTED Final   Streptococcus pneumoniae NOT DETECTED NOT DETECTED Final   Streptococcus pyogenes NOT DETECTED NOT DETECTED Final   A.calcoaceticus-baumannii NOT DETECTED NOT DETECTED Final   Bacteroides fragilis NOT DETECTED NOT DETECTED Final   Enterobacterales NOT DETECTED NOT DETECTED Final   Enterobacter cloacae complex NOT DETECTED NOT DETECTED Final   Escherichia coli NOT DETECTED NOT DETECTED Final   Klebsiella aerogenes NOT DETECTED NOT DETECTED Final   Klebsiella oxytoca NOT DETECTED NOT  DETECTED Final   Klebsiella pneumoniae NOT DETECTED NOT DETECTED Final   Proteus species NOT DETECTED NOT DETECTED Final   Salmonella species NOT DETECTED NOT DETECTED Final   Serratia marcescens NOT DETECTED NOT DETECTED Final   Haemophilus influenzae NOT DETECTED NOT DETECTED Final   Neisseria meningitidis NOT DETECTED NOT DETECTED Final   Pseudomonas aeruginosa NOT DETECTED NOT DETECTED Final   Stenotrophomonas maltophilia NOT DETECTED NOT DETECTED Final   Candida albicans NOT DETECTED NOT DETECTED Final   Candida auris NOT DETECTED NOT DETECTED Final   Candida glabrata NOT DETECTED NOT DETECTED Final   Candida krusei NOT DETECTED NOT DETECTED Final   Candida parapsilosis NOT DETECTED NOT DETECTED Final   Candida tropicalis NOT DETECTED NOT DETECTED Final   Cryptococcus neoformans/gattii NOT DETECTED NOT DETECTED Final   Meth resistant mecA/C and MREJ NOT DETECTED NOT DETECTED Final    Comment: Performed at [facility], [address], [place], [place] [address]  Urine Culture     Status: Abnormal   Collection Time: 12/09/20  9:24 PM   Specimen: In/Out Cath Urine  Result Value Ref Range Status   Specimen Description   Final    IN/OUT CATH URINE Performed at [facility], [address] [place], [place] [address]    Special Requests   Final    NONE Performed at [facility], [address] [place], [place] [address]    Culture MULTIPLE SPECIES PRESENT, SUGGEST RECOLLECTION (A)  Final   Report Status 12/11/2020 FINAL  Final  CULTURE, BLOOD (ROUTINE X 2) w Reflex to ID Panel     Status: None (Preliminary result)   Collection Time: 12/10/20  4:29 PM   Specimen: BLOOD  Result Value Ref Range Status   Specimen Description BLOOD LEFT ANTECUBITAL  Final   Special Requests   Final    BOTTLES DRAWN AEROBIC ONLY Blood Culture adequate volume   Culture   Final    NO GROWTH 3 DAYS Performed at [facility], [address] [place], [place]  [address]    Report Status PENDING  Incomplete  CULTURE, BLOOD (ROUTINE X 2) w Reflex to ID Panel     Status: None (Preliminary result)   Collection Time: 12/10/20  5:19 PM   Specimen: BLOOD  Result Value Ref Range Status   Specimen Description BLOOD BLOOD LEFT FOREARM  Final   Special Requests   Final    BOTTLES DRAWN AEROBIC AND ANAEROBIC Blood Culture adequate volume   Culture   Final    NO GROWTH 3 DAYS Performed at [facility], [address], [place], [place] [address]    Report Status PENDING  Incomplete  MRSA Next Gen by PCR, Nasal     Status: None   Collection Time: 12/11/20  4:29 AM   Specimen: Nasal Mucosa; Nasal Swab  Result Value Ref Range Status   MRSA by PCR Next Gen NOT DETECTED NOT DETECTED Final    Comment: (NOTE) The GeneXpert MRSA Assay (FDA approved for NASAL specimens only), is one component of a comprehensive MRSA colonization surveillance program. It is not intended to diagnose MRSA infection nor to guide or monitor treatment for MRSA infections. Test performance is not FDA approved in patients less than 49 years old. Performed at [facility], [address], [place], [place] [address]          Radiology Studies: CT HEAD WO CONTRAST (5MM)  Result Date: 12/13/2020 CLINICAL DATA:  75 year old female with new onset drooling, altered mental status. EXAM: CT HEAD WITHOUT CONTRAST TECHNIQUE: Contiguous axial images were obtained from the base of the skull through the vertex without intravenous contrast. COMPARISON:  Head CT 07/06/2012. FINDINGS: Study is mildly degraded by motion artifact despite repeated imaging attempts. Brain: Cerebral volume is not significantly changed since 2014 and is within normal limits for age. No midline shift, ventriculomegaly, mass effect, evidence of mass lesion, intracranial hemorrhage or evidence of cortically based acute infarction. Allowing for mild motion gray-white matter differentiation is within normal  limits for age throughout the brain. Vascular: Mild Calcified atherosclerosis at the skull base. No suspicious intracranial vascular hyperdensity. Skull: No acute osseous abnormality identified. Chronic anterior C1-C2 degeneration. Sinuses/Orbits: Chronic left maxillary sinusitis with mild improvement since 2014. Other Visualized paranasal sinuses and mastoids are clear. Other: No acute orbit or scalp soft tissue finding. IMPRESSION: 1. Normal for age non contrast CT appearance of the brain when allowing for mildly motion degraded study. 2. Chronic left maxillary sinusitis. Electronically Signed   By: Genevie Ann M.D.   On: 12/13/2020 09:08   DG Chest Port 1 View  Result Date: 12/13/2020 CLINICAL DATA:  75 year old female with hypoxia. EXAM: PORTABLE CHEST 1 VIEW COMPARISON:  Portable chest 11/21/2020 and earlier. FINDINGS: Portable AP upright view at 0806 hours. Progressive left lung base opacification, obscuring the left hemidiaphragm. Stable cardiac size and mediastinal contours. Right lung appears stable. And pulmonary vascularity has not significantly changed since 12/09/2020. No overt edema. No acute osseous abnormality identified. Paucity of bowel gas in the upper abdomen. Chronic degeneration and postoperative changes to the right shoulder. IMPRESSION: Confluent left lung base opacification since 12/09/2020. Consider left lower lobe pneumonia and/or increasing pleural effusion. Electronically Signed   By: Genevie Ann M.D.   On: 12/13/2020 09:13   ECHOCARDIOGRAM COMPLETE  Result Date: 12/11/2020    ECHOCARDIOGRAM REPORT   Patient Name:   Jamie Benitez Date of Exam: 12/11/2020 Medical Rec #:  [id number]       Height:       60.0 in Accession #:    [id number]  Weight:       225.0 lb Date of Birth:  Nov 24, 1945        BSA:          1.962 m Patient Age:    21 years        BP:           97/73 mmHg Patient Gender: F               HR:           102 bpm. Exam Location:  [facility] Procedure: 2D Echo, Cardiac Doppler and  Color Doppler Indications:     Bacteremia R78.81  History:         Patient has no prior history of Echocardiogram examinations.                  CHF, Previous Myocardial Infarction; Risk Factors:Hypertension                  and Diabetes.  Sonographer:     Sherrie Sport Referring Phys:  [id number] Tsosie Billing Diagnosing Phys: Kathlyn Sacramento MD  Sonographer Comments: Suboptimal apical window. IMPRESSIONS  1. Left ventricular ejection fraction, by estimation, is 25 to 30%. The left ventricle has severely decreased function. The left ventricle demonstrates regional wall motion abnormalities (see scoring diagram/findings for description). The left ventricular internal cavity size was mildly dilated. There is moderate left ventricular hypertrophy. Left ventricular diastolic parameters are indeterminate. There is akinesis of the left ventricular, entire inferior wall and inferolateral wall.  2. Right ventricular systolic function is normal. The right ventricular size is normal. Tricuspid regurgitation signal is inadequate for assessing PA pressure.  3. Left atrial size was moderately dilated.  4. Right atrial size was mildly dilated.  5. The mitral valve is normal in structure. Moderate mitral valve regurgitation. No evidence of mitral stenosis.  6. The aortic valve is normal in structure. Aortic valve regurgitation is not visualized. Mild to moderate aortic valve sclerosis/calcification is present, without any evidence of aortic stenosis. Conclusion(s)/Recommendation(s): No evidence of valvular vegetations on this transthoracic echocardiogram. FINDINGS  Left Ventricle: Left ventricular ejection fraction, by estimation, is 25 to 30%. The left ventricle has severely decreased function. The left ventricle demonstrates regional wall motion abnormalities. The left ventricular internal cavity size was mildly  dilated. There is moderate left ventricular hypertrophy. Left ventricular diastolic parameters are indeterminate.  Right Ventricle: The right ventricular size is normal. No increase in right ventricular wall thickness. Right ventricular systolic function is normal. Tricuspid regurgitation signal is inadequate for assessing PA pressure. Left Atrium: Left atrial size was moderately dilated. Right Atrium: Right atrial size was mildly dilated. Pericardium: There is no evidence of pericardial effusion. Mitral Valve: The mitral valve is normal in structure. Moderate mitral valve regurgitation. No evidence of mitral valve stenosis. Tricuspid Valve: The tricuspid valve is normal in structure. Tricuspid valve regurgitation is not demonstrated. No evidence of tricuspid stenosis. Aortic Valve: The aortic valve is normal in structure. Aortic valve regurgitation is not visualized. Mild to moderate aortic valve sclerosis/calcification is present, without any evidence of aortic stenosis. Aortic valve mean gradient measures 2.5 mmHg. Aortic valve peak gradient measures 4.5 mmHg. Aortic valve area, by VTI measures 2.89 cm. Pulmonic Valve: The pulmonic valve was normal in structure. Pulmonic valve regurgitation is not visualized. No evidence of pulmonic stenosis. Aorta: The aortic root is normal in size and structure. Venous: The inferior vena cava was not well visualized. IAS/Shunts: No atrial level shunt detected by  color flow Doppler.  LEFT VENTRICLE PLAX 2D LVIDd:         5.50 cm LVIDs:         4.60 cm LV PW:         1.40 cm LV IVS:        1.30 cm LVOT diam:     2.00 cm LV SV:         38 LV SV Index:   20 LVOT Area:     3.14 cm  RIGHT VENTRICLE RV Basal diam:  3.90 cm RV S prime:     11.60 cm/s TAPSE (M-mode): 3.6 cm LEFT ATRIUM             Index       RIGHT ATRIUM           Index LA diam:        5.10 cm 2.60 cm/m  RA Area:     21.20 cm LA Vol (A2C):   57.9 ml 29.50 ml/m RA Volume:   64.50 ml  32.87 ml/m LA Vol (A4C):   70.9 ml 36.13 ml/m LA Biplane Vol: 71.0 ml 36.18 ml/m  AORTIC VALVE                   PULMONIC VALVE AV Area (Vmax):     2.82 cm    PV Vmax:        0.80 m/s AV Area (Vmean):   2.90 cm    PV Peak grad:   2.6 mmHg AV Area (VTI):     2.89 cm    RVOT Peak grad: 4 mmHg AV Vmax:           105.50 cm/s AV Vmean:          76.700 cm/s AV VTI:            0.132 m AV Peak Grad:      4.5 mmHg AV Mean Grad:      2.5 mmHg LVOT Vmax:         94.60 cm/s LVOT Vmean:        70.700 cm/s LVOT VTI:          0.122 m LVOT/AV VTI ratio: 0.92  AORTA Ao Root diam: 2.70 cm MITRAL VALVE               TRICUSPID VALVE MV Area (PHT): 4.80 cm    TR Peak grad:   9.2 mmHg MV Decel Time: 158 msec    TR Vmax:        152.00 cm/s MV E velocity: 66.80 cm/s                            SHUNTS                            Systemic VTI:  0.12 m                            Systemic Diam: 2.00 cm Kathlyn Sacramento MD Electronically signed by Kathlyn Sacramento MD Signature Date/Time: 12/11/2020/4:17:31 PM    Final         Scheduled Meds:  atorvastatin  80 mg Oral Daily   calcitRIOL  0.25 mcg Oral Daily   calcium carbonate  1,000 mg of elemental calcium Oral BID WC   chlorhexidine gluconate (MEDLINE KIT)  15 mL Mouth  Rinse BID   Chlorhexidine Gluconate Cloth  6 each Topical Daily   clopidogrel  75 mg Oral Daily   hydrOXYzine  50 mg Oral Daily   insulin aspart  0-5 Units Subcutaneous QHS   insulin aspart  0-9 Units Subcutaneous TID WC   lidocaine  1 patch Transdermal Q24H   mouth rinse  15 mL Mouth Rinse 10 times per day   midodrine  10 mg Oral TID WC   pantoprazole  40 mg Oral Daily   phenytoin (DILANTIN) IV  100 mg Intravenous Q8H   traZODone  50 mg Oral QHS   Continuous Infusions:  sodium chloride 75 mL/hr at 12/12/20 1941   sodium chloride 75 mL/hr (12/13/20 1009)   amiodarone 30 mg/hr (12/13/20 0504)   calcium gluconate     calcium gluconate      ceFAZolin (ANCEF) IV     fosPHENYtoin (CEREBYX) IV       LOS: 3 days    Time spent: 35 minutes    Sidney Ace, MD [facility] Pager [phone]  If 7PM-7AM, please contact  night-coverage 12/13/2020, 11:43 AM

## 2020-12-14 ENCOUNTER — Inpatient Hospital Stay: Payer: Medicare Other

## 2020-12-14 DIAGNOSIS — F418 Other specified anxiety disorders: Secondary | ICD-10-CM

## 2020-12-14 DIAGNOSIS — N179 Acute kidney failure, unspecified: Secondary | ICD-10-CM | POA: Diagnosis not present

## 2020-12-14 DIAGNOSIS — R531 Weakness: Secondary | ICD-10-CM | POA: Diagnosis not present

## 2020-12-14 DIAGNOSIS — B9561 Methicillin susceptible Staphylococcus aureus infection as the cause of diseases classified elsewhere: Secondary | ICD-10-CM | POA: Diagnosis not present

## 2020-12-14 DIAGNOSIS — R7881 Bacteremia: Secondary | ICD-10-CM | POA: Diagnosis not present

## 2020-12-14 DIAGNOSIS — I214 Non-ST elevation (NSTEMI) myocardial infarction: Secondary | ICD-10-CM | POA: Diagnosis not present

## 2020-12-14 DIAGNOSIS — Z515 Encounter for palliative care: Secondary | ICD-10-CM

## 2020-12-14 DIAGNOSIS — M549 Dorsalgia, unspecified: Secondary | ICD-10-CM | POA: Diagnosis not present

## 2020-12-14 DIAGNOSIS — Z66 Do not resuscitate: Secondary | ICD-10-CM

## 2020-12-14 LAB — GLUCOSE, CAPILLARY
Glucose-Capillary: 123 mg/dL — ABNORMAL HIGH (ref 70–99)
Glucose-Capillary: 84 mg/dL (ref 70–99)
Glucose-Capillary: 108 mg/dL — ABNORMAL HIGH (ref 70–99)

## 2020-12-14 LAB — BASIC METABOLIC PANEL
Anion gap: 15 (ref 5–15)
BUN: 89 mg/dL — ABNORMAL HIGH (ref 8–23)
CO2: 26 mmol/L (ref 22–32)
Calcium: 6.5 mg/dL — ABNORMAL LOW (ref 8.9–10.3)
Chloride: 90 mmol/L — ABNORMAL LOW (ref 98–111)
Creatinine, Ser: 6.24 mg/dL — ABNORMAL HIGH (ref 0.44–1.00)
GFR, Estimated: 7 mL/min — ABNORMAL LOW (ref >60)
Glucose, Bld: 121 mg/dL — ABNORMAL HIGH (ref 70–99)
Potassium: 3.7 mmol/L (ref 3.5–5.1)
Sodium: 131 mmol/L — ABNORMAL LOW (ref 135–145)

## 2020-12-14 LAB — CBC
HCT: 23.6 % — ABNORMAL LOW (ref 36.0–46.0)
Hemoglobin: 8.7 g/dL — ABNORMAL LOW (ref 12.0–15.0)
MCH: 27.4 pg (ref 26.0–34.0)
MCHC: 36.9 g/dL — ABNORMAL HIGH (ref 30.0–36.0)
MCV: 74.4 fL — ABNORMAL LOW (ref 80.0–100.0)
Platelets: 185 10*3/uL (ref 150–400)
RBC: 3.17 MIL/uL — ABNORMAL LOW (ref 3.87–5.11)
RDW: 15.8 % — ABNORMAL HIGH (ref 11.5–15.5)
WBC: 12.4 10*3/uL — ABNORMAL HIGH (ref 4.0–10.5)
nRBC: 0 % (ref 0.0–0.2)

## 2020-12-14 LAB — HEPATITIS B SURFACE ANTIBODY,QUALITATIVE: Hep B S Ab: NONREACTIVE

## 2020-12-14 LAB — HEPATITIS B SURFACE ANTIGEN: Hepatitis B Surface Ag: NONREACTIVE

## 2020-12-14 IMAGING — DX DG ABD PORTABLE 1V
2 series · 2 of 2 positions shown · non-contrast
Comparison: None.

CLINICAL DATA: Abdomen pain

EXAM:
PORTABLE ABDOMEN - 1 VIEW

[abdomen supine (1 of 2)]
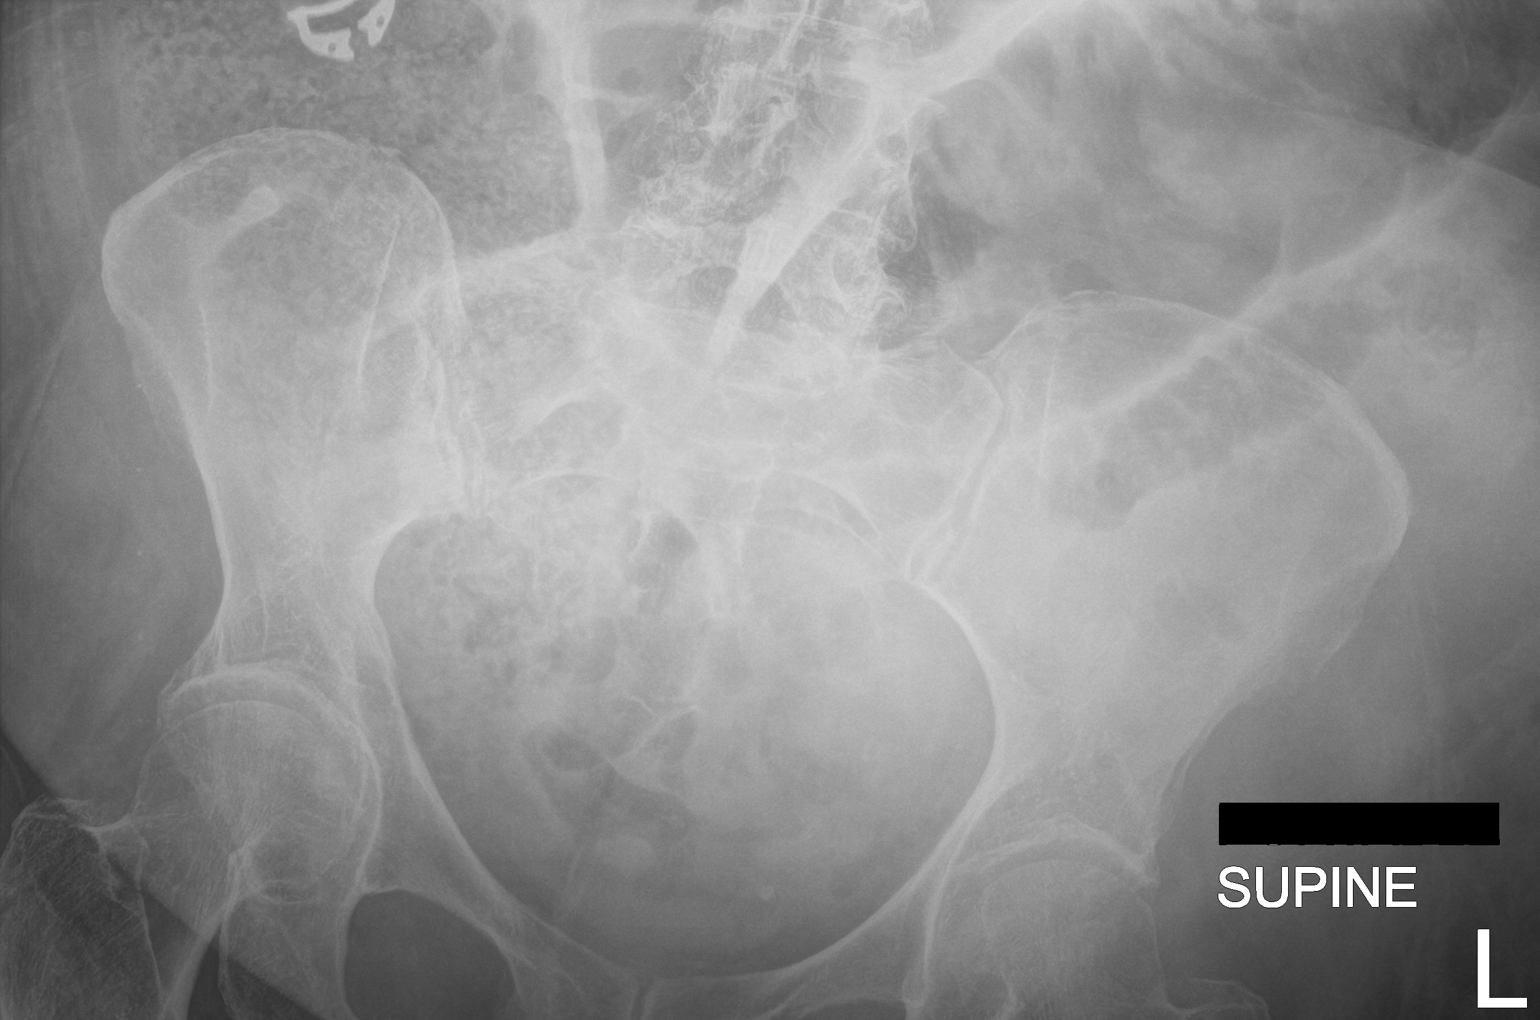

[abdomen supine (2 of 2)]
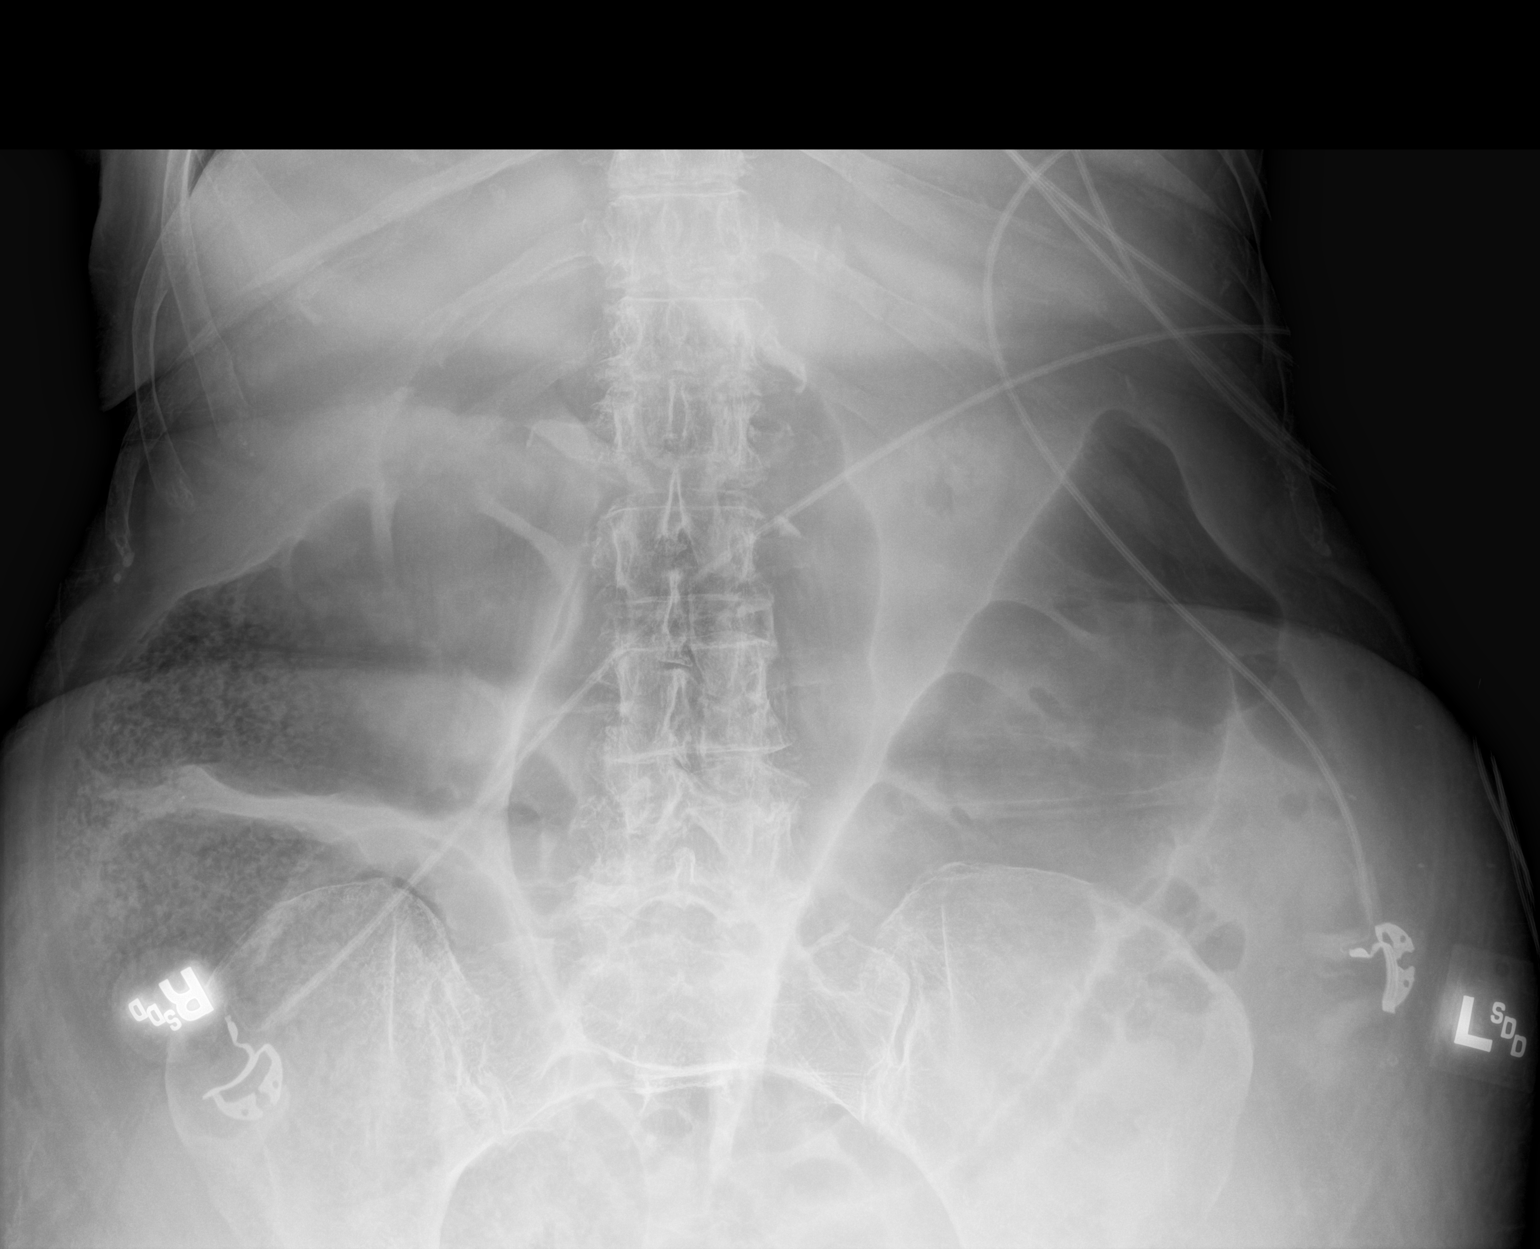

[2 of 2 positions shown; findings below may reference images not displayed]

FINDINGS: Moderate diffuse gaseous distension of the colon measuring up to 8
cm. Frothy appearance in the right colon is presumably due to stool.
No definite dilated central small bowel.
IMPRESSION: Moderate diffuse gaseous dilatation of the bowel, possible colon
ileus though if distal obstruction is a concern, further evaluation
with CT should be considered

## 2020-12-14 MED ORDER — HEPARIN SODIUM (PORCINE) 1000 UNIT/ML IJ SOLN
INTRAMUSCULAR | Status: AC
  Filled 2020-12-14: qty 1

## 2020-12-14 MED ORDER — LIDOCAINE-PRILOCAINE 2.5-2.5 % EX CREA
1.0000 | TOPICAL_CREAM | CUTANEOUS | Status: DC | PRN
Start: 2020-12-14 — End: 2020-12-15
  Filled 2020-12-14: qty 5

## 2020-12-14 MED ORDER — SODIUM CHLORIDE 0.9 % IV SOLN: 100.0000 mL | INTRAVENOUS | Status: DC | PRN

## 2020-12-14 MED ORDER — HEPARIN (PORCINE) 25000 UT/250ML-% IV SOLN
1300.0000 [IU]/h | INTRAVENOUS | Status: DC
  Administered 2020-12-14: 1300 [IU]/h via INTRAVENOUS
  Filled 2020-12-14: qty 250

## 2020-12-14 MED ORDER — LIDOCAINE HCL (PF) 1 % IJ SOLN
5.0000 mL | INTRAMUSCULAR | Status: DC | PRN
  Filled 2020-12-14: qty 5

## 2020-12-14 MED ORDER — HEPARIN SODIUM (PORCINE) 5000 UNIT/ML IJ SOLN: 5000.0000 [IU] | Freq: Three times a day (TID) | INTRAMUSCULAR | Status: DC

## 2020-12-14 MED ORDER — HEPARIN SODIUM (PORCINE) 1000 UNIT/ML IJ SOLN
INTRAMUSCULAR | Status: AC
  Filled 2020-12-14: qty 4

## 2020-12-14 MED ORDER — ALTEPLASE 2 MG IJ SOLR: 2.0000 mg | Freq: Once | INTRAMUSCULAR | Status: DC | PRN

## 2020-12-14 MED ORDER — AMIODARONE HCL 200 MG PO TABS
200.0000 mg | ORAL_TABLET | Freq: Two times a day (BID) | ORAL | Status: DC
  Administered 2020-12-14 – 2020-12-18 (×9): 200 mg via ORAL
  Filled 2020-12-14 (×9): qty 1

## 2020-12-14 MED ORDER — PENTAFLUOROPROP-TETRAFLUOROETH EX AERO
1.0000 "application " | INHALATION_SPRAY | CUTANEOUS | Status: DC | PRN
  Filled 2020-12-14: qty 30

## 2020-12-14 MED ORDER — HEPARIN SODIUM (PORCINE) 1000 UNIT/ML DIALYSIS
1000.0000 [IU] | INTRAMUSCULAR | Status: DC | PRN
Start: 2020-12-14 — End: 2020-12-15
  Filled 2020-12-14: qty 1

## 2020-12-14 NOTE — Progress Notes (Signed)
Neurology Progress Note  Neurology f/u visit was attempted today but patient was OTF undergoing procedure.  Data  MRI brain wo contrast Mildly motion degraded exam. 8 mm acute infarct within the left parietal white matter. Additional suspected punctate acute infarct within the left parietal cortex. Background mild chronic small-vessel ischemic changes within the cerebral white matter. Small chronic lacunar infarcts within the left thalamus and right cerebellar hemisphere. Mild generalized parenchymal atrophy.  MRA head  Intracranial atherosclerotic disease with multifocal stenoses, as outlined and with findings most notably as follows.   Sites of severe stenosis within mid M2 left MCA vessels.   The P1 posterior cerebral artery segments are hypoplastic with superimposed severe stenoses bilaterally. However, there are sizable bilateral posterior communicating arteries which are widely patent. Severe stenosis within the proximal P2 right PCA. Severe right PCA distal branch atherosclerotic irregularity.   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  All CNS imaging personally reviewed: I agree with the above interpretations  TTE  1. Left ventricular ejection fraction, by estimation, is 25 to 30%. The  left ventricle has severely decreased function. The left ventricle  demonstrates regional wall motion abnormalities (see scoring  diagram/findings for description). The left  ventricular internal cavity size was mildly dilated. There is moderate  left ventricular hypertrophy. Left ventricular diastolic parameters are  indeterminate. There is akinesis of the left ventricular, entire inferior  wall and inferolateral wall.   2. Right ventricular systolic function is normal. The right ventricular  size is normal. Tricuspid regurgitation signal is inadequate for assessing  PA pressure.   3. Left atrial size was moderately dilated.   4.  Right atrial size was mildly dilated.   5. The mitral valve is normal in structure. Moderate mitral valve  regurgitation. No evidence of mitral stenosis.   6. The aortic valve is normal in structure. Aortic valve regurgitation is  not visualized. Mild to moderate aortic valve sclerosis/calcification is  present, without any evidence of aortic stenosis.   Conclusion(s)/Recommendation(s): No evidence of valvular vegetations on  this transthoracic echocardiogram.   MRA neck pending  EEG pending  O:  Vitals:   12/14/20 1130 12/14/20 1145  BP: (!) 122/103 (!) 126/58  Pulse: 94 93  Resp: (!) 22 15  Temp:    SpO2:     Examination was attempted today but patient was OTF undergoing procedure.   Assessment: 75 year old female presenting with diffuse weakness in the setting of multiple comorbidities including hypokalemia, CHF and AKI on CKD. She experienced a seizure this morning and Neurology was consulted. She has no prior history of seizures. Head CT shows normal appearance of the brain for age.  . 1. Exam reveals diffuse weakness rated as 3 to 4-/5, worse proximally. Also with mild drowsiness, decreased attention and mild disorientation. Minimal intermittent jaw and right chin/lower cheek twitching is not felt to be likely to seizure activity as it is non-rhythmic, lasts for only 1-2 seconds and is not associated with change in mental status.  2. Etiology for her seizure is being worked up. Could have been triggered by her acute ischemic stroke combined hypocalcemia and mild hyponatremia. She has been loaded with Keppra 1000 mg IV.  3. Has been hypotensive this admission, but BP levels this AM around the time of her seizure were normal, therefore a syncopal convulsion is felt to be unlikely.  4. Worsened renal function. BUN and Cr today are 80 and 6.07, with eGFR of  7. Nephrology plans to start dialysis this week. 5. Regarding her weakness, it is most likely multifactorial, with pain,  hypokalemia, AKI and diffuse fatigue in the setting of CHF with EF of 25 to 30% and severely decreased LV function. Reflexes are of decreased amplitudes, but present. Low on the DDx but possible would be MG; ordered MG panel. Doubt AIDP given presence of reflexes several days since presentation and more likely explanation of multifactorial etiology.    Recommendations:  # Seizure - In the interest of minimizing drug reactions, more consistent serum levels, ease of use, and fewer side effects, I would like to switch her back over from phenytoin to keppra. Will wait until Nephrology starts dialysis so that I can order it to be given appropriately relative to dialysis schedule. Continue phenytoin dosing per pharmacy for now. - Inpatient seizure precautions.  - Ativan 2 mg IV PRN seizure and call Neurology - No driving for 6 mos after last seizure according to [place] law. Do not bathe alone, climb ladders, operate heavy machinery or do any other activity that could be dangerous if you had a seizure while doing it.  # Stroke w/u - MRA neck ordered to complete stroke w/u - F/u EEG - Atorvastatin d/c'd her atorvastatin for now given weakness which will be further worked [employer] the outpatient setting (LDL currently 33) - Please note in d/c summary that myasthenia gravis panel is pending and should be followed up by PCP as outpatient  Will f/u outstanding studies.  Su Monks, MD [facility] [phone]  If 7pm- 7am, please page neurology on call as listed in [facility].

## 2020-12-14 NOTE — Progress Notes (Signed)
Patient with first dialysis session with right neck CVC, intact, functioning well, maintained BFR throughout session. No targeted UF established for these session. Vitals stable, notable disorientation with periods of awareness. Patient will return next day for extended session and possible fluid removal.

## 2020-12-14 NOTE — Care Management Important Message (Signed)
Important Message  Patient Details  Name: Jamie Benitez MRN: [id number] Date of Birth: 22-Apr-1945   Medicare Important Message Given:  Yes  Patient out of room upon visit.   No family available.  Copy of Medicare IM left in room for reference.    Johnell Comings 12/14/2020, 3:40 PM

## 2020-12-14 NOTE — Progress Notes (Addendum)
Palliative Care Progress Note, Assessment & Plan   Patient Name: Jamie Benitez       Date: 12/14/2020 DOB: 06-21-1945  Age: 75 y.o. MRN#: [id number] Attending Physician: Tresa Moore, MD Primary Care Physician: Kandyce Rud, MD Admit Date: 12/09/2020  Reason for Consultation/Follow-up: Establishing goals of care  Subjective: Pt is resting in bed in NAD. She does not open her eyes but answers yes/no questions appropriately. She denies pain. She did not want to eat anything at this time. She said HD went "fine" today. When I asked if she knew where she was she asked who was watching her cat.   HPI: Patient is a 75 y.o. female with a PMH significant for sCHF with EF of 35%, CAD with stent placement, anxiety, Bell's palsy, chronic back pain, chronic cough, CKD (1st HD 9/25), depression, DMII, GERD, HLD, HTN, multilevel DDD, MI x 2 (2007, 2014), neuropathy, osteoporosis.   Plan of Care: I have reviewed medical records including EPIC notes, labs and imaging, assessed the patient and then spoke with patient's daughter Marylene Land over the phone to discuss diagnosis prognosis, GOC, EOL wishes, disposition and options.  We discussed the patient's current illness and what it means in the larger context of patient's on-going co-morbidities. Marylene Land shared that she is not being given any options to make her mother 100% better and that unless we can make her 100% better than she has no questions/concerns about her mother's care at this time.   The difference between aggressive medical intervention and comfort care was considered in light of the patient's current health status. I shared that should Marylene Land and her family decide to not attempt aggressive measures then we can further discuss what a path of comfort would  look like. I educated Marylene Land that a comfort path would focus on quality of life for the patient, focusing on making the most out of the time her mother has left. Marylene Land shared that the team was going to give her mother HD again tomorrow, but that Marylene Land didn't think it would do her mother any good.   I discussed the importance of continued conversation with family and the medical providers regarding overall plan of care and treatment options, ensuring decisions are within the context of the patient's values and GOCs.    Questions and concerns were addressed. The family was encouraged to call with questions or concerns. Marylene Land shared that if she needed the Palliative Medicine Team she would call us. I shared that I will continue to monitor her mother, round on her tomorrow, and be available for discussion any time.   Code Status: DNR  Prognosis:  Unable to determine  Discharge Planning: To Be Determined  Recommendations/Plan: HD tomorrow Treat the treatable  Care plan was discussed with nursing, patient, patient's daughter Vista Lawman of Stay: 4  Physical Exam Constitutional:      Appearance: She is ill-appearing.  HENT:     Head: Normocephalic and atraumatic.  Pulmonary:     Effort: Pulmonary effort is normal.  Abdominal:     Palpations: Abdomen is soft.  Skin:    Findings: Bruising present.  Neurological:     Mental Status: She is disoriented.  Vital Signs: BP (!) 126/58   Pulse 93   Temp 98.7 F (37.1 C) (Oral)   Resp 15   Ht 5' (1.524 m)   Wt 98.3 kg   SpO2 98%   BMI 42.34 kg/m  SpO2: SpO2: 98 % O2 Device: O2 Device: Nasal Cannula O2 Flow Rate: O2 Flow Rate (L/min): 3 L/min      Palliative Assessment/Data: 50%       Total Time 20 minutes Prolonged Time Billed  no   Greater than 50%  of this time was spent counseling and coordinating care related to the above assessment and plan.  Thank you for allowing the Palliative Medicine Team to assist  in the care of this patient.  Samara Deist L. Manon Hilding, FNP-BC Palliative Medicine Team Team Phone # [phone]

## 2020-12-14 NOTE — Progress Notes (Signed)
Eeg done 

## 2020-12-14 NOTE — Progress Notes (Signed)
PT Cancellation Note  Patient Details Name: Jamie Benitez MRN: [id number] DOB: 1946/01/10   Cancelled Treatment:    Reason Eval/Treat Not Completed: Patient at procedure or test/unavailable. Per RN pt off floor for HD. RN reporting to hold off on rehab today overall due to cognitive status. Will re-attempt at later time/date as able.   Delphia Grates. Fairly IV, PT, DPT Physical Therapist- [place]  [facility]  12/14/2020, 9:20 AM

## 2020-12-14 NOTE — Progress Notes (Signed)
ID Pt is somnolent On calling her she says she is okay She was moaning and when I asked her was she in pain she said no, and it is a habit  Patient Vitals for the past 24 hrs:  BP Temp Temp src Pulse Resp SpO2  12/14/20 1609 124/75 97.9 F (36.6 C) -- 98 17 99 %  12/14/20 1145 (!) 126/58 -- -- 93 15 --  12/14/20 1130 (!) 122/103 -- -- 94 (!) 22 --  12/14/20 1115 116/65 -- -- -- 15 --  12/14/20 1100 115/74 -- -- 75 15 --  12/14/20 1045 113/66 -- -- (!) 36 19 --  12/14/20 1030 120/60 -- -- -- 15 --  12/14/20 1015 115/78 -- -- -- 15 --  12/14/20 1000 (!) 124/56 -- -- 85 17 --  12/14/20 0945 -- 98.7 F (37.1 C) Oral 87 17 98 %  12/14/20 0834 -- -- -- -- 14 --  12/14/20 0800 133/61 98.2 F (36.8 C) Axillary 77 18 96 %  12/14/20 0415 115/72 98.1 F (36.7 C) Axillary -- 20 98 %  12/14/20 0104 (!) 120/46 98.4 F (36.9 C) Oral 89 18 97 %  12/13/20 2030 -- -- -- -- -- 100 %  12/13/20 2021 (!) 100/52 98.6 F (37 C) Oral 69 15 (!) 87 %   O/e somnolent Chronically ill Pale Chest b/l air entry- crepts bases HS irregular Abd soft- minimal tenderness to deep palpation Rt Ij HD  LAbs  CBC Latest Ref Rng & Units 12/14/2020 12/13/2020 12/12/2020  WBC 4.0 - 10.5 K/uL 12.4(H) 17.6(H) 16.0(H)  Hemoglobin 12.0 - 15.0 g/dL 5.0(T) 10.0(L) 9.8(L)  Hematocrit 36.0 - 46.0 % 23.6(L) 28.6(L) 27.1(L)  Platelets 150 - 400 K/uL 185 155 173      CMP Latest Ref Rng & Units 12/14/2020 12/13/2020 12/12/2020  Glucose 70 - 99 mg/dL 888(K) 800(L) 491(P)  BUN 8 - 23 mg/dL 91(T) 05(W) 97(X)  Creatinine 0.44 - 1.00 mg/dL 4.80(X) 6.55(V) 7.48(O)  Sodium 135 - 145 mmol/L 131(L) 128(L) 128(L)  Potassium 3.5 - 5.1 mmol/L 3.7 3.7 2.8(L)  Chloride 98 - 111 mmol/L 90(L) 86(L) 82(L)  CO2 22 - 32 mmol/L 26 19(L) 27  Calcium 8.9 - 10.3 mg/dL 7.0(B) 6.3(LL) 6.0(LL)  Total Protein 6.5 - 8.1 g/dL - - 4.8(L)  Total Bilirubin 0.3 - 1.2 mg/dL - - 0.7  Alkaline Phos 38 - 126 U/L - - 110  AST 15 - 41 U/L - - 101(H)  ALT 0  - 44 U/L - - 7    BC 12/09/20 MSSA 12/10/20 BC- NG   MRI/MRA 8 mm acute infarct within the left parietal white matter.  Additional suspected punctate acute infarct within the left parietal cortex.  Background mild chronic small-vessel ischemic changes within the cerebral white matter.  Small chronic lacunar infarcts within the left thalamus and right cerebellar hemisphere.  Impression/recommendation MSSA bacteremia.  Possible source could be skin lesions but unclear On cefazolin Repeat blood culture has been sent 2D echo does not show any vegetation on the valve.  EF is 25 to 30%. MRI of the lumbar spine does not show any infection. Patient will need TEE. But not stable to have the procedure  Seizure yesterday- MRI showed 8 mm acute infarct within the left parietal white matter.  Sites of severe stenosis within mid M2 left MCA vessels. Encephalopathy- metabolic likely due to AKI AKI.  Worsening.  Combination of sepsis and hypotension. Started HD today  A. fib with RVR.  On  amiodarone  Demand ischemia  History of CAD with stent.  Patient on Plavix and atorvastatin.  Ischemic cardiomyopathy.  EF is 25 to 30%   Discussed the management with care team

## 2020-12-14 NOTE — Consult Note (Signed)
ANTICOAGULATION CONSULT NOTE - Initial Consult  Pharmacy Consult for heparin infusion Indication: stroke  Allergies  Allergen Reactions   Sulfa Antibiotics Rash    Mouth blisters Other reaction(s): Angioedema "whole mouth swells"   Gabapentin Diarrhea    Patient Measurements: Height: 5' (152.4 cm) Weight: 98.3 kg (216 lb 12.8 oz) IBW/kg (Calculated) : 45.5 Heparin Dosing Weight: 69.3 kg   Vital Signs: Temp: 97.9 F (36.6 C) (09/26 1609) Temp Source: Oral (09/26 0945) BP: 124/75 (09/26 1609) Pulse Rate: 98 (09/26 1609)  Labs: Recent Labs    12/12/20 0355 12/12/20 1158 12/13/20 0730 12/14/20 0655  HGB 9.8* 9.8* 10.0* 8.7*  HCT 27.5* 27.1* 28.6* 23.6*  PLT 155 173 155 185  HEPARINUNFRC 0.47 0.41 0.80*  --   CREATININE  --  5.06* 6.07* 6.24*    Estimated Creatinine Clearance: 8.4 mL/min (A) (by C-G formula based on SCr of 6.24 mg/dL (H)).   Medical History: Past Medical History:  Diagnosis Date   Anxiety    Bell's palsy 08/2018   CHF (congestive heart failure) (HCC)    Chronic back pain    Chronic cough    CKD (chronic kidney disease), stage III (HCC)    Depression    Diabetes mellitus, type 2 (HCC)    GERD (gastroesophageal reflux disease)    Hyperlipidemia    Hypertension    Multilevel degenerative disc disease    Myocardial infarction [facility]) 2007   & 2014   Neuropathy    Osteoporosis     Medications:  Previous therapeutic heparin infusion discontinued 9/25 1030    Assessment:  Pt is 75 yo female who originally presented with generalized weakness, and started in heparin infusion for possible nSTEMI/ACS, continued for atrial fibrillation. Neurology following for new onset seizures, concern for CVA. Pharmacy has been consulted to re-initiate heparin for CVA with reduced heparin level goals at this time.  Goal of Therapy:  Heparin level 0.3-0.5 units/ml Monitor platelets by anticoagulation protocol: Yes   Plan:  Start heparin infusion at 1300  units/hr (previously within goal range of 0.3-0.5 units/mL)  Check anti-Xa level in 8 hours and daily while on heparin Continue to monitor H&H and platelets  Sharen Hones, PharmD, BCPS Clinical Pharmacist   12/14/2020,4:45 PM

## 2020-12-14 NOTE — Progress Notes (Addendum)
[facility]  PROGRESS NOTE   Subjective:   Patient seen and evaluated during dialysis   HEMODIALYSIS FLOWSHEET:  Blood Flow Rate (mL/min): 200 mL/min Arterial Pressure (mmHg): -60 mmHg Venous Pressure (mmHg): 60 mmHg Transmembrane Pressure (mmHg): 20 mmHg Ultrafiltration Rate (mL/min): 250 mL/min Dialysate Flow Rate (mL/min): 300 ml/min Conductivity: Machine : 13.8 Conductivity: Machine : 13.8  No complaints at this time  Objective:  Vital signs in last 24 hours:  Temp:  [98.1 F (36.7 C)-98.7 F (37.1 C)] 98.7 F (37.1 C) (09/26 0945) Pulse Rate:  [36-94] 93 (09/26 1145) Resp:  [14-22] 15 (09/26 1145) BP: (100-133)/(46-103) 126/58 (09/26 1145) SpO2:  [87 %-100 %] 98 % (09/26 0945)  Weight change:  Filed Weights   12/09/20 0354 12/13/20 0503  Weight: 102.1 kg 98.3 kg    Intake/Output: I/O last 3 completed shifts: In: 2438.3 [I.V.:2288.3; IV Piggyback:150] Out: 148 [Urine:145; Stool:3]   Intake/Output this shift:  No intake/output data recorded.  Physical Exam: General:  No acute distress  Head:  Normocephalic, atraumatic. Moist oral mucosal membranes  Eyes:  Anicteric  Lungs:   Clear to auscultation, normal effort  Heart:  S1S2 no rubs  Abdomen:   Soft, nontender, bowel sounds present  Extremities:  Trace peripheral edema.  Neurologic:  Awake, alert, following commands  Skin:  No lesions  Access: Rt IJ temp cath    Basic Metabolic Panel: Recent Labs  Lab 12/09/20 0415 12/10/20 0721 12/10/20 2318 12/11/20 1815 12/12/20 1158 12/13/20 0730 12/14/20 0655  NA 134*   < > 130* 128* 128* 128* 131*  K 3.4*   < > 3.8 3.5 2.8* 3.7 3.7  CL 100   < > 100 93* 82* 86* 90*  CO2 22   < > 18* 20* 27 19* 26  GLUCOSE 238*   < > 128* 97 105* 143* 121*  BUN 35*   < > 65* 70* 75* 80* 89*  CREATININE 2.02*   < > 5.17* 5.28* 5.06* 6.07* 6.24*  CALCIUM 8.5*   < > 7.3* 6.4* 6.0* 6.3* 6.5*  MG 1.8  --   --   --   --   --   --    < > = values in this  interval not displayed.     CBC: Recent Labs  Lab 12/09/20 0415 12/10/20 0721 12/12/20 0355 12/12/20 1158 12/13/20 0730 12/14/20 0655  WBC 11.4* 13.9* 16.0* 16.0* 17.6* 12.4*  NEUTROABS 9.1*  --   --  14.1*  --   --   HGB 12.3 10.7* 9.8* 9.8* 10.0* 8.7*  HCT 37.6 33.0* 27.5* 27.1* 28.6* 23.6*  MCV 86.0 84.2 78.8* 77.4* 79.9* 74.4*  PLT 190 150 155 173 155 185      Urinalysis: No results for input(s): COLORURINE, LABSPEC, PHURINE, GLUCOSEU, HGBUR, BILIRUBINUR, KETONESUR, PROTEINUR, UROBILINOGEN, NITRITE, LEUKOCYTESUR in the last 72 hours.  Invalid input(s): APPERANCEUR    Imaging: CT HEAD WO CONTRAST (5MM)  Result Date: 12/13/2020 CLINICAL DATA:  75 year old female with new onset drooling, altered mental status. EXAM: CT HEAD WITHOUT CONTRAST TECHNIQUE: Contiguous axial images were obtained from the base of the skull through the vertex without intravenous contrast. COMPARISON:  Head CT 07/06/2012. FINDINGS: Study is mildly degraded by motion artifact despite repeated imaging attempts. Brain: Cerebral volume is not significantly changed since 2014 and is within normal limits for age. No midline shift, ventriculomegaly, mass effect, evidence of mass lesion, intracranial hemorrhage or evidence of cortically based acute infarction. Allowing for mild motion gray-white  matter differentiation is within normal limits for age throughout the brain. Vascular: Mild Calcified atherosclerosis at the skull base. No suspicious intracranial vascular hyperdensity. Skull: No acute osseous abnormality identified. Chronic anterior C1-C2 degeneration. Sinuses/Orbits: Chronic left maxillary sinusitis with mild improvement since 2014. Other Visualized paranasal sinuses and mastoids are clear. Other: No acute orbit or scalp soft tissue finding. IMPRESSION: 1. Normal for age non contrast CT appearance of the brain when allowing for mildly motion degraded study. 2. Chronic left maxillary sinusitis.  Electronically Signed   By: Genevie Ann M.D.   On: 12/13/2020 09:08   MR ANGIO HEAD WO CONTRAST  Result Date: 12/13/2020 CLINICAL DATA:  Seizure, abnormal neuro exam. EXAM: MRA HEAD WITHOUT CONTRAST TECHNIQUE: Angiographic images of the Circle of Willis were acquired using MRA technique without intravenous contrast. COMPARISON:  Same-day brain MRI 12/13/2020. FINDINGS: Mildly motion degraded examination. Anterior circulation: The intracranial internal carotid arteries are patent. The M1 middle cerebral arteries are patent. Atherosclerotic irregularity of the M2 and more distal middle cerebral artery vessels bilaterally. Most notably, there are sites of severe stenosis within mid M2 left MCA vessels. The anterior cerebral arteries are patent. 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 Posterior circulation: The intracranial vertebral arteries are patent. The basilar artery is patent. The posterior cerebral arteries are patent. P1 PCA segments are hypoplastic bilaterally with superimposed high-grade stenoses. However, there are sizable bilateral posterior communicating arteries which are widely patent. Severe stenosis within the proximal P2 right PCA. Severe right PCA distal branch atherosclerotic irregularity. Anatomic variants: As described IMPRESSION: Intracranial atherosclerotic disease with multifocal stenoses, as outlined and with findings most notably as follows. Sites of severe stenosis within mid M2 left MCA vessels. The P1 posterior cerebral artery segments are hypoplastic with superimposed severe stenoses bilaterally. However, there are sizable bilateral posterior communicating arteries which are widely patent. Severe stenosis within the proximal P2 right PCA. Severe right PCA distal branch atherosclerotic irregularity. 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 Electronically Signed   By: Kellie Simmering D.O.   On: 12/13/2020 14:45   MR BRAIN WO CONTRAST  Result Date: 12/13/2020 CLINICAL DATA:  Seizure, abnormal neuro exam. EXAM: MRI HEAD WITHOUT CONTRAST TECHNIQUE: Multiplanar, multiecho pulse sequences of the brain and surrounding structures were obtained without intravenous contrast. COMPARISON:  Prior head CT examinations 12/13/2020 and earlier. FINDINGS: Brain: Mild intermittent motion degradation. Mild generalized cerebral and cerebellar atrophy. 8 mm acute infarct within the left parietal white matter (series 6, image 27). Additional suspected punctate acute infarct within the left parietal cortex. Mild multifocal T2/FLAIR hyperintense signal abnormality within the cerebral white matter, nonspecific but compatible with chronic small vessel ischemic disease. Small chronic lacunar infarct within the right thalamus. Small chronic lacunar infarcts within the right cerebellar hemisphere. The hippocampi are symmetric in size and signal. No evidence of an intracranial mass. No chronic intracranial blood products. No extra-axial fluid collection. No midline shift. Vascular: Maintained flow voids within the proximal large arterial vessels. Skull and upper cervical spine: No focal suspicious marrow lesion. Sinuses/Orbits: Left optic nerve atrophy. Right lens replacement. Moderate mucosal thickening within an asymmetrically diminutive left maxillary sinus with associated chronic reactive osteitis. Trace mucosal thickening within the bilateral ethmoid air cells. IMPRESSION: Mildly motion degraded exam. 8 mm acute infarct within the left parietal white matter. Additional suspected punctate acute infarct within the left parietal cortex. Background mild chronic  small-vessel ischemic changes within the cerebral white matter. Small chronic lacunar infarcts within the left thalamus and right cerebellar hemisphere. Mild generalized parenchymal atrophy. Left optic nerve  atrophy. Paranasal sinus disease, most notably moderate left maxillary sinusitis. Electronically Signed   By: Kellie Simmering D.O.   On: 12/13/2020 14:37   DG Chest Port 1 View  Result Date: 12/13/2020 CLINICAL DATA:  PICC line EXAM: PORTABLE CHEST 1 VIEW COMPARISON:  December 13, 2020 FINDINGS: The cardiomediastinal silhouette is unchanged enlarged in contour.No PICC line is visualized as per order requisition. There is a RIGHT chest CVC with tip terminating over the RIGHT atrium. Small LEFT pleural effusion. No pneumothorax. No acute pleuroparenchymal abnormality. Visualized abdomen is unremarkable. Degenerative changes of the RIGHT shoulder. IMPRESSION: No PICC line is visualized as per order requisition. There is a RIGHT chest CVC with tip terminating over the RIGHT atrium. Electronically Signed   By: Valentino Saxon M.D.   On: 12/13/2020 16:41   DG Chest Port 1 View  Result Date: 12/13/2020 CLINICAL DATA:  75 year old female with hypoxia. EXAM: PORTABLE CHEST 1 VIEW COMPARISON:  Portable chest 11/21/2020 and earlier. FINDINGS: Portable AP upright view at 0806 hours. Progressive left lung base opacification, obscuring the left hemidiaphragm. Stable cardiac size and mediastinal contours. Right lung appears stable. And pulmonary vascularity has not significantly changed since 12/09/2020. No overt edema. No acute osseous abnormality identified. Paucity of bowel gas in the upper abdomen. Chronic degeneration and postoperative changes to the right shoulder. IMPRESSION: Confluent left lung base opacification since 12/09/2020. Consider left lower lobe pneumonia and/or increasing pleural effusion. Electronically Signed   By: Genevie Ann M.D.   On: 12/13/2020 09:13     Medications:     ceFAZolin (ANCEF) IV 1 g (12/13/20 2252)   dextrose 5 % and 0.9% NaCl Stopped (12/14/20 0904)    amiodarone  200 mg Oral BID   calcitRIOL  0.25 mcg Oral Daily   calcium carbonate  1,000 mg of elemental calcium Oral BID WC    chlorhexidine gluconate (MEDLINE KIT)  15 mL Mouth Rinse BID   Chlorhexidine Gluconate Cloth  6 each Topical Daily   clopidogrel  75 mg Oral Daily   heparin sodium (porcine)       heparin sodium (porcine)       hydrOXYzine  50 mg Oral Daily   insulin aspart  0-5 Units Subcutaneous QHS   insulin aspart  0-9 Units Subcutaneous TID WC   lidocaine  1 patch Transdermal Q24H   mouth rinse  15 mL Mouth Rinse 10 times per day   midodrine  10 mg Oral TID WC   pantoprazole  40 mg Oral Daily   phenytoin (DILANTIN) IV  100 mg Intravenous Q8H   sodium chloride flush  10-40 mL Intracatheter Q12H   traZODone  50 mg Oral QHS    Assessment/ Plan:     Principal Problem:   NSTEMI (non-ST elevated myocardial infarction) ([place]) Active Problems:   Chronic systolic CHF (congestive heart failure) (HCC)   Chronic back pain   Acute renal failure superimposed on stage 3a chronic kidney disease (HCC)   Depression with anxiety   Type II diabetes mellitus with renal manifestations (HCC)   CAD (coronary artery disease)   HLD (hyperlipidemia)   HTN (hypertension)   Hypokalemia   Leukocytosis   Acute decompensated heart failure (HCC)   Pressure injury of skin  75 year old white female with a history of hypertension, coronary artery disease, congestive heart failure, diabetes, hyperlipidemia, chronic  kidney disease now admitted with history of generalized weakness.  She is found to have Staphylococcus sepsis.  She also has acute kidney injury on the top of chronic kidney disease.   #1: Acute kidney injury: Patient with acute kidney injury on the top of chronic kidney disease.  She has CKD stage IIIb with a GFR of 44 cc/min previously. On admission, creatinine  2.02 with eGFR 26. Spoke to the patient's daughter over the telephone and she is agreeable to initiate dialysis.  Dialysis believed to be temporary due to AKI. Received first dialysis treatment today, tolerated well. Plan to dialyze tomorrow. Will continue  to monitor renal recovery   #2: Hypokalemia:  likely secondary to bicarbonate infusion.  Currently 5.7.  #3: Metabolic acidosis: Resolved with sodium bicarbonate infusion   #4: Dehydration/hyponatremia: Sodium 131. Will continue to correct with dialysis    #5: Anemia: likely due to chronic kidney disease.  Hgb 8.7 Continue iron supplementation.  #6: Seizure activity: Etiology is unclear.  Patient is being followed by neurology.  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 Also 60m vascular protrusion from right ICA.   #7: Hypocalcemia: calcitriol and calcium gluconate IV given yesterday. Calcium carbonate prescribed.     LOS: 4[facility] 9/26/20221:22 PM

## 2020-12-14 NOTE — Progress Notes (Addendum)
SLP Cancellation Note  Patient Details Name: Jamie Benitez MRN: [id number] DOB: 02-25-1946   Cancelled treatment:        Pt off the unit for dialysis, will reattempt when available   [name] 12/14/2020, 11:00 AM

## 2020-12-14 NOTE — Progress Notes (Signed)
PROGRESS NOTE    Jamie Benitez  KGY:[id number] DOB: 09-07-1945 DOA: 12/09/2020 PCP: Derinda Late, MD    Brief Narrative:  75 y.o. female with medical history significant of sCHF with EF 35%, CAD with stent placement, CKD-3A, chronic back pain, Bell's palsy, HTN, hyperlipidemia, diabetes mellitus, GERD, depression with anxiety, who presents with weakness.   Patient states that she has generalized weakness for more than 4 days.  No unilateral numbness or tingling to extremities.  No facial droop or slurred speech.  Patient has dry cough, denies chest pain or shortness breath.  No fever or chills.  Patient states that she had diarrhea in the past several days, which has resolved.  Currently no nausea, vomiting, diarrhea or abdominal pain.  No symptoms of UTI.  Patient complains of chronic lower back pain. Initially patient had oxygen desaturation to mid 80s, which improved to 98% on room air in ED.   Initial trop 105 with ST depression and T wave inversion in inferior leads and anterior leads.  Dr. Saunders Revel of cardiology was consulted, who did not think patient had STEMI. He recommended [facility] cardiology consult. IV heparin is started in ED  Cardiology patient's presentation is inconsistent with NSTEMI.  No significant delta in troponin.  VQ scan and lower extremity duplex negative for VTE.  Patient did have elements of mild BNP elevation and small left pleural effusion possible decompensated heart failure.  Was not given diuretics due to elevated kidney function.  Subsequently patient went into a tachyarrhythmia likely atrial fibrillation which she does not have a history of.  Also creatinine worsened from 2-4.2.  Nephrology consult 9/22.  9/23: Patient was moved to stepdown unit for closer monitoring due to persistent hypotension.  Not requiring vasopressor support.  Remains on amiodarone.  Remains in atrial fibrillation, rate control improved.  Kidney function continues to deteriorate.  Discussed with  nephrology.  Patient still undecided about initiation of hemodialysis  9/25: Early this morning patient had RRT called for decreased level of mentation and possible seizure activity.  Neurology consult requested.  Stat CT head negative for mass or bleed.  ABG with mild hypoxia but otherwise reassuring.  Neurology ordering phenytoin and routine EEG as well as MRI brain.  Patient remains encephalopathic and unable to provide history.  9/26: Patient little bit more awake today.  Grandson at bedside.  Patient had temporary dialysis line placed in right IJ on 9/25.  To undergo hemodialysis today.   Assessment & Plan:   Principal Problem:   NSTEMI (non-ST elevated myocardial infarction) ([place]) Active Problems:   Chronic systolic CHF (congestive heart failure) (HCC)   Chronic back pain   Acute renal failure superimposed on stage 3a chronic kidney disease (HCC)   Depression with anxiety   Type II diabetes mellitus with renal manifestations (HCC)   CAD (coronary artery disease)   HLD (hyperlipidemia)   HTN (hypertension)   Hypokalemia   Leukocytosis   Acute decompensated heart failure (HCC)   Pressure injury of skin  Possible new onset seizure Possible new CVA Patient had shaking activity and encephalopathy noted early 9/25 Seizure activity versus uremic encephalopathy primary differentials Neurology consulted CT head negative ABG with mild hypoxia otherwise reassuring MRI brain with small acute infarct Plan: Phenytoin per neurology recommendations Spot EEG DAPT Seizure precautions Neurology follow-up  Acute renal failure superimposed on stage 3a chronic kidney disease ([place]):  Metabolic acidosis  Hyponatremia recent baseline creatinine 1.2 on 10/28/2019.   Her creatinine is at 2.02, BUN 35 on  admission Creatinine progressively worsening Nephrology consulted Dialysis catheter placed 9/25 Plan: Trial of HD today  Possible MSSA bacteremia Unclear what this this represents  infection or true contaminant Does not meet sepsis criteria No clear source identified TTE negative MRI spine negative Plan: Continue Ancef If patient's mental status improves we will request TEE from cardiology  NSTEMI and history of CAD: S/p of stent placement  trop  105 --> 93 --> 93.   Patient does not have chest pain, but has generalized weakness.   Dr. Clayborn Bigness of cardiology is consulted.   D-dimer is positive, but VQ scan negative for PE.   Lower extremity Dopplers negative for DVT. Per cardiology patient's presentation is inconsistent with NSTEMI Plan: Hold heparin GTT for now.  Continue on goal-directed medical therapy with dual antiplatelet therapy, beta-blocker, ACE inhibitor  New onset atrial fibrillation with rapid ventricular response Noted on telemetry 9/22 Ventricular rates up to 130s Plan: Patient seems to be tolerating p.o.  Discontinue amiodarone gtt.  Transition to p.o. amiodarone 200 mg twice daily.  Maintain telemetry monitoring  Chronic systolic CHF (congestive heart failure) ([place]) 2D echo on 05/22/2017 showed EF of 35.  Patient has 1+ leg edema, elevated BNP 398, indicating possible fluid overload.   Patient has progressively deteriorating kidney function.  Oliguric.  Trial of HD to start 9/26.   Chronic back pain -As needed Tylenol -continue home tramadol  Depression with anxiety -Continue home medications   Type II diabetes mellitus with renal manifestations (HCC) Recent A1c 5.8, well controlled.   Patient is taking 70/30 NPH insulin and glucose at home. -Sliding scale insulin -Continue 70/30 NPH 8 units twice daily.  Monitor carefully for hypoglycemia   HLD (hyperlipidemia) -Lipitor   HTN (hypertension) -Hold lisinopril -IV hydralazine as needed -Imdur on hold   Hypokalemia Monitor and replace as necessary Maintain K greater than 4, mag greater than 2   Possible bile duct dilatation Noted partial on MRI LFTs normal Defer biliary tract  imaging for now  DVT prophylaxis: SQ heparin Code Status: DNR Family Communication: [name] [phone] on 9/22.  Daughter at bedside 9/25.  Grandson at bedside 9/26 Disposition Plan: Status is: Inpatient  Remains inpatient appropriate because:Inpatient level of care appropriate due to severity of illness  Dispo: The patient is from: Home              Anticipated d/c is to: SNF              Patient currently is not medically stable to d/c.   Difficult to place patient No  Patient with numerous acute issues requiring hospitalization.  Disposition plan pending.  Prognosis guarded     Level of care: Progressive Cardiac  Consultants:  Nephrology Cardiology  Procedures:  None  Antimicrobials:  Cefazolin   Subjective: Patient seen and examined.  Encephalopathy slowly improving.  Patient remains lethargic and weak.  Objective: Vitals:   12/14/20 1100 12/14/20 1115 12/14/20 1130 12/14/20 1145  BP: 115/74 116/65 (!) 122/103 (!) 126/58  Pulse: 75  94 93  Resp: 15 15 (!) 22 15  Temp:      TempSrc:      SpO2:      Weight:      Height:        Intake/Output Summary (Last 24 hours) at 12/14/2020 1356 Last data filed at 12/14/2020 1145 Gross per 24 hour  Intake 1225.71 ml  Output 146 ml  Net 1079.71 ml   Filed Weights   12/09/20 0354 12/13/20  0503  Weight: 102.1 kg 98.3 kg    Examination:  General exam: Encephalopathic.  Lethargic Respiratory system: Poor respiratory effort.  Lungs clear.  Normal work of breathing.  2 L Cardiovascular system:  S1-S2, regular rate, irregular rhythm, no murmurs Gastrointestinal system: Soft, nontender, nondistended, normal bowel sounds Central nervous system: Encephalopathic.  Oriented to person and place Extremities: Unable to assess power and range of motion Skin: No rashes, lesions or ulcers Psychiatry: Mood and affect flattened.  Insight impaired    Data Reviewed: I have personally reviewed following labs and imaging  studies  CBC: Recent Labs  Lab 12/09/20 0415 12/10/20 0721 12/12/20 0355 12/12/20 1158 12/13/20 0730 12/14/20 0655  WBC 11.4* 13.9* 16.0* 16.0* 17.6* 12.4*  NEUTROABS 9.1*  --   --  14.1*  --   --   HGB 12.3 10.7* 9.8* 9.8* 10.0* 8.7*  HCT 37.6 33.0* 27.5* 27.1* 28.6* 23.6*  MCV 86.0 84.2 78.8* 77.4* 79.9* 74.4*  PLT 190 150 155 173 155 917   Basic Metabolic Panel: Recent Labs  Lab 12/09/20 0415 12/10/20 0721 12/10/20 2318 12/11/20 1815 12/12/20 1158 12/13/20 0730 12/14/20 0655  NA 134*   < > 130* 128* 128* 128* 131*  K 3.4*   < > 3.8 3.5 2.8* 3.7 3.7  CL 100   < > 100 93* 82* 86* 90*  CO2 22   < > 18* 20* 27 19* 26  GLUCOSE 238*   < > 128* 97 105* 143* 121*  BUN 35*   < > 65* 70* 75* 80* 89*  CREATININE 2.02*   < > 5.17* 5.28* 5.06* 6.07* 6.24*  CALCIUM 8.5*   < > 7.3* 6.4* 6.0* 6.3* 6.5*  MG 1.8  --   --   --   --   --   --    < > = values in this interval not displayed.   GFR: Estimated Creatinine Clearance: 8.4 mL/min (A) (by C-G formula based on SCr of 6.24 mg/dL (H)). Liver Function Tests: Recent Labs  Lab 12/09/20 0415 12/11/20 0627 12/11/20 1815 12/12/20 1158  AST 37 76* 101* 101*  ALT 24 13 11 7   ALKPHOS 85 82 93 110  BILITOT 1.2 0.7 0.7 0.7  PROT 6.9 5.3* 4.2* 4.8*  ALBUMIN 3.5 2.3*  2.4* 1.8* 2.0*   Recent Labs  Lab 12/09/20 0415  LIPASE 35   No results for input(s): AMMONIA in the last 168 hours. Coagulation Profile: Recent Labs  Lab 12/09/20 0616  INR 1.1   Cardiac Enzymes: No results for input(s): CKTOTAL, CKMB, CKMBINDEX, TROPONINI in the last 168 hours. BNP (last 3 results) No results for input(s): PROBNP in the last 8760 hours. HbA1C: No results for input(s): HGBA1C in the last 72 hours.  CBG: Recent Labs  Lab 12/13/20 0824 12/13/20 1154 12/13/20 1617 12/13/20 2125 12/14/20 0759  GLUCAP 140* 78 70 118* 123*   Lipid Profile: No results for input(s): CHOL, HDL, LDLCALC, TRIG, CHOLHDL, LDLDIRECT in the last 72  hours.  Thyroid Function Tests: No results for input(s): TSH, T4TOTAL, FREET4, T3FREE, THYROIDAB in the last 72 hours.  Anemia Panel: No results for input(s): VITAMINB12, FOLATE, FERRITIN, TIBC, IRON, RETICCTPCT in the last 72 hours. Sepsis Labs: Recent Labs  Lab 12/09/20 0434 12/11/20 0627 12/11/20 0832 12/11/20 1040  LATICACIDVEN 1.8 1.2 1.3 1.7    Recent Results (from the past 240 hour(s))  Resp Panel by RT-PCR (Flu A&B, Covid) Nasopharyngeal Swab     Status: None   Collection  Time: 12/09/20  4:34 AM   Specimen: Nasopharyngeal Swab; Nasopharyngeal(NP) swabs in vial transport medium  Result Value Ref Range Status   SARS Coronavirus 2 by RT PCR NEGATIVE NEGATIVE Final    Comment: (NOTE) SARS-CoV-2 target nucleic acids are NOT DETECTED.  The SARS-CoV-2 RNA is generally detectable in upper respiratory specimens during the acute phase of infection. The lowest concentration of SARS-CoV-2 viral copies this assay can detect is 138 copies/mL. A negative result does not preclude SARS-Cov-2 infection and should not be used as the sole basis for treatment or other patient management decisions. A negative result may occur with  improper specimen collection/handling, submission of specimen other than nasopharyngeal swab, presence of viral mutation(s) within the areas targeted by this assay, and inadequate number of viral copies(<138 copies/mL). A negative result must be combined with clinical observations, patient history, and epidemiological information. The expected result is Negative.  Fact Sheet for Patients:  EntrepreneurPulse.com.au  Fact Sheet for Healthcare Providers:  IncredibleEmployment.be  This test is no t yet approved or cleared by the [place] FDA and  has been authorized for detection and/or diagnosis of SARS-CoV-2 by FDA under an Emergency Use Authorization (EUA). This EUA will remain  in effect (meaning this test can be  used) for the duration of the COVID-19 declaration under Section 564(b)(1) of the Act, 21 U.S.C.section 360bbb-3(b)(1), unless the authorization is terminated  or revoked sooner.       Influenza A by PCR NEGATIVE NEGATIVE Final   Influenza B by PCR NEGATIVE NEGATIVE Final    Comment: (NOTE) The Xpert Xpress SARS-CoV-2/FLU/RSV plus assay is intended as an aid in the diagnosis of influenza from Nasopharyngeal swab specimens and should not be used as a sole basis for treatment. Nasal washings and aspirates are unacceptable for Xpert Xpress SARS-CoV-2/FLU/RSV testing.  Fact Sheet for Patients: EntrepreneurPulse.com.au  Fact Sheet for Healthcare Providers: IncredibleEmployment.be  This test is not yet approved or cleared by the [place] FDA and has been authorized for detection and/or diagnosis of SARS-CoV-2 by FDA under an Emergency Use Authorization (EUA). This EUA will remain in effect (meaning this test can be used) for the duration of the COVID-19 declaration under Section 564(b)(1) of the Act, 21 U.S.C. section 360bbb-3(b)(1), unless the authorization is terminated or revoked.  Performed at [facility], [address] [place], [place] [address]   Blood culture (routine single)     Status: Abnormal   Collection Time: 12/09/20  4:34 AM   Specimen: BLOOD  Result Value Ref Range Status   Specimen Description   Final    BLOOD LEFT ARM Performed at [facility], [address], [place], [place] [address]    Special Requests   Final    BOTTLES DRAWN AEROBIC AND ANAEROBIC Blood Culture adequate volume Performed at [address] [place], [place] [address]    Culture  Setup Time   Final    GRAM POSITIVE COCCI IN BOTH AEROBIC AND ANAEROBIC BOTTLES CRITICAL RESULT CALLED TO, READ BACK BY AND VERIFIED WITH: Aldona Bar RAEUR AT 3202 ON 12/09/20 BY SS Performed at [facility], [place]  [address], [place], [place] [address]    Culture STAPHYLOCOCCUS AUREUS (A)  Final   Report Status 12/11/2020 FINAL  Final   Organism ID, Bacteria STAPHYLOCOCCUS AUREUS  Final      Susceptibility   Staphylococcus aureus - MIC*    CIPROFLOXACIN 1 SENSITIVE Sensitive     ERYTHROMYCIN <=0.25 SENSITIVE Sensitive  GENTAMICIN <=0.5 SENSITIVE Sensitive     OXACILLIN 0.5 SENSITIVE Sensitive     TETRACYCLINE <=1 SENSITIVE Sensitive     VANCOMYCIN <=0.5 SENSITIVE Sensitive     TRIMETH/SULFA <=10 SENSITIVE Sensitive     CLINDAMYCIN <=0.25 SENSITIVE Sensitive     RIFAMPIN <=0.5 SENSITIVE Sensitive     Inducible Clindamycin NEGATIVE Sensitive     * STAPHYLOCOCCUS AUREUS  Blood Culture ID Panel (Reflexed)     Status: Abnormal   Collection Time: 12/09/20  4:34 AM  Result Value Ref Range Status   Enterococcus faecalis NOT DETECTED NOT DETECTED Final   Enterococcus Faecium NOT DETECTED NOT DETECTED Final   Listeria monocytogenes NOT DETECTED NOT DETECTED Final   Staphylococcus species DETECTED (A) NOT DETECTED Final    Comment: CRITICAL RESULT CALLED TO, READ BACK BY AND VERIFIED WITH: SAMANTHA RAEUR AT 1750 ON 12/09/20 BY SS    Staphylococcus aureus (BCID) DETECTED (A) NOT DETECTED Final    Comment: CRITICAL RESULT CALLED TO, READ BACK BY AND VERIFIED WITH: SAMANTHA RAEUR AT 1750 ON 12/09/20 BY SS    Staphylococcus epidermidis NOT DETECTED NOT DETECTED Final   Staphylococcus lugdunensis NOT DETECTED NOT DETECTED Final   Streptococcus species NOT DETECTED NOT DETECTED Final   Streptococcus agalactiae NOT DETECTED NOT DETECTED Final   Streptococcus pneumoniae NOT DETECTED NOT DETECTED Final   Streptococcus pyogenes NOT DETECTED NOT DETECTED Final   A.calcoaceticus-baumannii NOT DETECTED NOT DETECTED Final   Bacteroides fragilis NOT DETECTED NOT DETECTED Final   Enterobacterales NOT DETECTED NOT DETECTED Final   Enterobacter cloacae complex NOT DETECTED NOT DETECTED Final   Escherichia coli NOT  DETECTED NOT DETECTED Final   Klebsiella aerogenes NOT DETECTED NOT DETECTED Final   Klebsiella oxytoca NOT DETECTED NOT DETECTED Final   Klebsiella pneumoniae NOT DETECTED NOT DETECTED Final   Proteus species NOT DETECTED NOT DETECTED Final   Salmonella species NOT DETECTED NOT DETECTED Final   Serratia marcescens NOT DETECTED NOT DETECTED Final   Haemophilus influenzae NOT DETECTED NOT DETECTED Final   Neisseria meningitidis NOT DETECTED NOT DETECTED Final   Pseudomonas aeruginosa NOT DETECTED NOT DETECTED Final   Stenotrophomonas maltophilia NOT DETECTED NOT DETECTED Final   Candida albicans NOT DETECTED NOT DETECTED Final   Candida auris NOT DETECTED NOT DETECTED Final   Candida glabrata NOT DETECTED NOT DETECTED Final   Candida krusei NOT DETECTED NOT DETECTED Final   Candida parapsilosis NOT DETECTED NOT DETECTED Final   Candida tropicalis NOT DETECTED NOT DETECTED Final   Cryptococcus neoformans/gattii NOT DETECTED NOT DETECTED Final   Meth resistant mecA/C and MREJ NOT DETECTED NOT DETECTED Final    Comment: Performed at [facility], [address] [place], [place] [address]  Urine Culture     Status: Abnormal   Collection Time: 12/09/20  9:24 PM   Specimen: In/Out Cath Urine  Result Value Ref Range Status   Specimen Description   Final    IN/OUT CATH URINE Performed at [facility], [address] [place], [place] [address]    Special Requests   Final    NONE Performed at [facility], [address] [place],  [address]    Culture MULTIPLE SPECIES PRESENT, SUGGEST RECOLLECTION (A)  Final   Report Status 12/11/2020 FINAL  Final  CULTURE, BLOOD (ROUTINE X 2) w Reflex to ID Panel     Status: None (Preliminary result)   Collection Time: 12/10/20  4:29 PM   Specimen: BLOOD  Result Value Ref Range Status  Specimen Description BLOOD LEFT ANTECUBITAL  Final   Special Requests   Final    BOTTLES DRAWN AEROBIC ONLY Blood Culture  adequate volume   Culture   Final    NO GROWTH 4 DAYS Performed at [facility], [address] [place], [place] [address]    Report Status PENDING  Incomplete  CULTURE, BLOOD (ROUTINE X 2) w Reflex to ID Panel     Status: None (Preliminary result)   Collection Time: 12/10/20  5:19 PM   Specimen: BLOOD  Result Value Ref Range Status   Specimen Description BLOOD BLOOD LEFT FOREARM  Final   Special Requests   Final    BOTTLES DRAWN AEROBIC AND ANAEROBIC Blood Culture adequate volume   Culture   Final    NO GROWTH 4 DAYS Performed at [facility], [address], [place], [place] [address]    Report Status PENDING  Incomplete  MRSA Next Gen by PCR, Nasal     Status: None   Collection Time: 12/11/20  4:29 AM   Specimen: Nasal Mucosa; Nasal Swab  Result Value Ref Range Status   MRSA by PCR Next Gen NOT DETECTED NOT DETECTED Final    Comment: (NOTE) The GeneXpert MRSA Assay (FDA approved for NASAL specimens only), is one component of a comprehensive MRSA colonization surveillance program. It is not intended to diagnose MRSA infection nor to guide or monitor treatment for MRSA infections. Test performance is not FDA approved in patients less than 70 years old. Performed at [facility] [address] [place],  [address]          Radiology Studies: CT HEAD WO CONTRAST (5MM)  Result Date: 12/13/2020 CLINICAL DATA:  75 year old female with new onset drooling, altered mental status. EXAM: CT HEAD WITHOUT CONTRAST TECHNIQUE: Contiguous axial images were obtained from the base of the skull through the vertex without intravenous contrast. COMPARISON:  Head CT 07/06/2012. FINDINGS: Study is mildly degraded by motion artifact despite repeated imaging attempts. Brain: Cerebral volume is not significantly changed since 2014 and is within normal limits for age. No midline shift, ventriculomegaly, mass effect, evidence of mass lesion, intracranial  hemorrhage or evidence of cortically based acute infarction. Allowing for mild motion gray-white matter differentiation is within normal limits for age throughout the brain. Vascular: Mild Calcified atherosclerosis at the skull base. No suspicious intracranial vascular hyperdensity. Skull: No acute osseous abnormality identified. Chronic anterior C1-C2 degeneration. Sinuses/Orbits: Chronic left maxillary sinusitis with mild improvement since 2014. Other Visualized paranasal sinuses and mastoids are clear. Other: No acute orbit or scalp soft tissue finding. IMPRESSION: 1. Normal for age non contrast CT appearance of the brain when allowing for mildly motion degraded study. 2. Chronic left maxillary sinusitis. Electronically Signed   By: Genevie Ann M.D.   On: 12/13/2020 09:08   MR ANGIO HEAD WO CONTRAST  Result Date: 12/13/2020 CLINICAL DATA:  Seizure, abnormal neuro exam. EXAM: MRA HEAD WITHOUT CONTRAST TECHNIQUE: Angiographic images of the Circle of Willis were acquired using MRA technique without intravenous contrast. COMPARISON:  Same-day brain MRI 12/13/2020. FINDINGS: Mildly motion degraded examination. Anterior circulation: The intracranial internal carotid arteries are patent. The M1 middle cerebral arteries are patent. Atherosclerotic irregularity of the M2 and more distal middle cerebral artery vessels bilaterally. Most notably, there are sites of severe stenosis within mid M2 left MCA vessels. The anterior cerebral arteries are patent. 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 Posterior circulation: The intracranial vertebral arteries are patent. The basilar artery is patent. The posterior cerebral arteries are patent. P1 PCA segments are hypoplastic bilaterally with superimposed high-grade stenoses. However, there are sizable bilateral posterior communicating arteries which are widely patent. Severe stenosis  within the proximal P2 right PCA. Severe right PCA distal branch atherosclerotic irregularity. Anatomic variants: As described IMPRESSION: Intracranial atherosclerotic disease with multifocal stenoses, as outlined and with findings most notably as follows. Sites of severe stenosis within mid M2 left MCA vessels. The P1 posterior cerebral artery segments are hypoplastic with superimposed severe stenoses bilaterally. However, there are sizable bilateral posterior communicating arteries which are widely patent. Severe stenosis within the proximal P2 right PCA. Severe right PCA distal branch atherosclerotic irregularity. 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 Electronically Signed   By: Kellie Simmering D.O.   On: 12/13/2020 14:45   MR BRAIN WO CONTRAST  Result Date: 12/13/2020 CLINICAL DATA:  Seizure, abnormal neuro exam. EXAM: MRI HEAD WITHOUT CONTRAST TECHNIQUE: Multiplanar, multiecho pulse sequences of the brain and surrounding structures were obtained without intravenous contrast. COMPARISON:  Prior head CT examinations 12/13/2020 and earlier. FINDINGS: Brain: Mild intermittent motion degradation. Mild generalized cerebral and cerebellar atrophy. 8 mm acute infarct within the left parietal white matter (series 6, image 27). Additional suspected punctate acute infarct within the left parietal cortex. Mild multifocal T2/FLAIR hyperintense signal abnormality within the cerebral white matter, nonspecific but compatible with chronic small vessel ischemic disease. Small chronic lacunar infarct within the right thalamus. Small chronic lacunar infarcts within the right cerebellar hemisphere. The hippocampi are symmetric in size and signal. No evidence of an intracranial mass. No chronic intracranial blood products. No extra-axial fluid collection. No midline shift. Vascular: Maintained flow voids within the proximal large arterial vessels. Skull and upper cervical  spine: No focal suspicious marrow lesion. Sinuses/Orbits: Left optic nerve atrophy. Right lens replacement. Moderate mucosal thickening within an asymmetrically diminutive left maxillary sinus with associated chronic reactive osteitis. Trace mucosal thickening within the bilateral ethmoid air cells. IMPRESSION: Mildly motion degraded exam. 8 mm acute infarct within the left parietal white matter. Additional suspected punctate acute infarct within the left parietal cortex. Background mild chronic small-vessel ischemic changes within the cerebral white matter. Small chronic lacunar infarcts within the left thalamus and right cerebellar hemisphere. Mild generalized parenchymal atrophy. Left optic nerve atrophy. Paranasal sinus disease, most notably moderate left maxillary sinusitis. Electronically Signed   By: Kellie Simmering D.O.   On: 12/13/2020 14:37   DG Chest Port 1 View  Result Date: 12/13/2020 CLINICAL DATA:  PICC line EXAM: PORTABLE CHEST 1 VIEW COMPARISON:  December 13, 2020 FINDINGS: The cardiomediastinal silhouette is unchanged enlarged in contour.No PICC line is visualized as per order requisition. There is a RIGHT chest CVC with tip terminating over the RIGHT atrium. Small LEFT pleural effusion. No pneumothorax. No acute pleuroparenchymal abnormality. Visualized abdomen is unremarkable. Degenerative changes of the RIGHT shoulder. IMPRESSION: No PICC line is visualized as per order requisition. There is a RIGHT chest CVC with tip terminating over the RIGHT atrium. Electronically Signed   By: Valentino Saxon M.D.   On: 12/13/2020 16:41   DG Chest Port 1 View  Result Date: 12/13/2020 CLINICAL DATA:  75 year old female with hypoxia. EXAM: PORTABLE CHEST 1 VIEW COMPARISON:  Portable chest 11/21/2020 and earlier. FINDINGS: Portable AP upright view at 0806 hours. Progressive left lung base opacification, obscuring the left hemidiaphragm. Stable cardiac size and mediastinal  contours. Right lung appears  stable. And pulmonary vascularity has not significantly changed since 12/09/2020. No overt edema. No acute osseous abnormality identified. Paucity of bowel gas in the upper abdomen. Chronic degeneration and postoperative changes to the right shoulder. IMPRESSION: Confluent left lung base opacification since 12/09/2020. Consider left lower lobe pneumonia and/or increasing pleural effusion. Electronically Signed   By: Genevie Ann M.D.   On: 12/13/2020 09:13        Scheduled Meds:  amiodarone  200 mg Oral BID   calcitRIOL  0.25 mcg Oral Daily   calcium carbonate  1,000 mg of elemental calcium Oral BID WC   chlorhexidine gluconate (MEDLINE KIT)  15 mL Mouth Rinse BID   Chlorhexidine Gluconate Cloth  6 each Topical Daily   clopidogrel  75 mg Oral Daily   heparin sodium (porcine)       heparin sodium (porcine)       hydrOXYzine  50 mg Oral Daily   insulin aspart  0-5 Units Subcutaneous QHS   insulin aspart  0-9 Units Subcutaneous TID WC   lidocaine  1 patch Transdermal Q24H   mouth rinse  15 mL Mouth Rinse 10 times per day   midodrine  10 mg Oral TID WC   pantoprazole  40 mg Oral Daily   phenytoin (DILANTIN) IV  100 mg Intravenous Q8H   sodium chloride flush  10-40 mL Intracatheter Q12H   traZODone  50 mg Oral QHS   Continuous Infusions:   ceFAZolin (ANCEF) IV 1 g (12/13/20 2252)   dextrose 5 % and 0.9% NaCl Stopped (12/14/20 0904)     LOS: 4 days    Time spent: 35 minutes    Sidney Ace, MD [facility] Pager [phone]  If 7PM-7AM, please contact night-coverage 12/14/2020, 1:56 PM

## 2020-12-14 NOTE — Procedures (Signed)
Routine EEG Report  Jamie Benitez is a 75 y.o. female with a history of seizure who is undergoing an EEG to evaluate for seizures.  Report: This EEG was acquired with electrodes placed according to the International 10-20 electrode system (including Fp1, Fp2, F3, F4, C3, C4, P3, P4, O1, O2, T3, T4, T5, T6, A1, A2, Fz, Cz, Pz). The following electrodes were missing or displaced: none.  The occipital dominant rhythm was 6 Hz. This activity is reactive to stimulation. Drowsiness was manifested by background fragmentation; deeper stages of sleep were not identified. There was no focal slowing. There were no interictal epileptiform discharges. There were no electrographic seizures identified. There was no abnormal response to photic stimulation. Hyperventilation was not performed.   Impression & clinical correlation: This EEG was obtained while awake and drowsy and is abnormal due to moderate diffuse slowing indicative of global cerebral dysfunction. There were no seizures or other epileptiform abnormalities seen during this recording.  Bing Neighbors, MD [facility] [phone]  If 7pm- 7am, please page neurology on call as listed in AMION.

## 2020-12-14 NOTE — Care Management (Signed)
Notified by bedside RN.  Patient with increasing abd pain and tender abdomen.  Evaluated at bedside.  VSS.  Patient remains encephalopathic.  Belly soft, positive bowel sounds.  Will get KUB.  Patient with multiple acute issues, very guarded prognosis.  Patient is a DNR.  If further decompensation, consider transition to comfort measures.  Palliative is on board.  Lolita Patella MD

## 2020-12-14 NOTE — Progress Notes (Signed)
OT Cancellation Note  Patient Details Name: GEENA WEINHOLD MRN: [id number] DOB: [birth year]   Cancelled Treatment:    Reason Eval/Treat Not Completed: Patient at procedure or test/ unavailable. Pt currently off the unit for dialysis. OT will re-attempt when pt is next available.  Jackquline Denmark, MS, OTR/L , CBIS ascom [phone]  12/14/20, 9:37 AM

## 2020-12-14 NOTE — Progress Notes (Addendum)
Post non-tunneled central venous catheter insertion, vascular MD asked me to heparin lock the new HD catheter.  The MD placed the order for IV Hep Lock, the pharmacy provided the medications. The HD nurse and IV team RN were not available at the time, so the floor RN flushed the HD cath with a 2nd floor RN there to verify amount.  1.8 mls of heparin was placed into the 2 dialysis lumens.

## 2020-12-14 NOTE — Evaluation (Signed)
Clinical/Bedside Swallow Evaluation Patient Details  Name: Jamie Benitez MRN: [id number] Date of Birth: 10-Jan-1946  Today's Date: 12/14/2020 Time: SLP Start Time (ACUTE ONLY): 1330 SLP Stop Time (ACUTE ONLY): 1350 SLP Time Calculation (min) (ACUTE ONLY): 20 min  Past Medical History:  Past Medical History:  Diagnosis Date   Anxiety    Bell's palsy 08/2018   CHF (congestive heart failure) (HCC)    Chronic back pain    Chronic cough    CKD (chronic kidney disease), stage III (HCC)    Depression    Diabetes mellitus, type 2 (HCC)    GERD (gastroesophageal reflux disease)    Hyperlipidemia    Hypertension    Multilevel degenerative disc disease    Myocardial infarction [facility]) 2007   & 2014   Neuropathy    Osteoporosis    Past Surgical History:  Past Surgical History:  Procedure Laterality Date   ABDOMINAL HYSTERECTOMY     BACK SURGERY     CARDIAC CATHETERIZATION  2007   & 2014.  stents   CATARACT EXTRACTION W/PHACO Right 09/16/2019   Procedure: CATARACT EXTRACTION PHACO AND INTRAOCULAR LENS PLACEMENT (IOC) RIGHT DIABETIC 2.12  00:27.3;  Surgeon: Nevada Crane, MD;  Location: [facility];  Service: Ophthalmology;  Laterality: Right;  Diabetic - insulin and oral meds   ROTATOR CUFF REPAIR     x2   HPI:  Per admitting H&P "Jamie Benitez is a 75 y.o. female with medical history significant of sCHF with EF 35%, CAD with stent placement, CKD-3A, chronic back pain, Bell's palsy, HTN, hyperlipidemia, diabetes mellitus, GERD, depression with anxiety, who presents with weakness.     Patient states that she has generalized weakness for more than 4 days.  No unilateral numbness or tingling to extremities.  No facial droop or slurred speech.  Patient has dry cough, denies chest pain or shortness breath.  No fever or chills.  Patient states that she had diarrhea in the past several days, which has resolved.  Currently no nausea, vomiting, diarrhea or abdominal pain.  No symptoms  of UTI.  Patient complains of chronic lower back pain. Initially patient had oxygen desaturation to mid 80s, which improved to 98% on room air in ED.     Initial trop 105 with ST depression and T wave inversion in inferior leads and anterior leads.  Dr. Okey Dupre of cardiology was consulted, who did not think patient had STEMI. He recommended [facility] cardiology consult. IV heparin is started in ED"    Assessment / Plan / Recommendation  Clinical Impression  Bedside swallow eval was limited as Pt was lethargic/ fatigues and did not wnat to take PO's. Pt returned for dialysis, somewhat lethargic. Pt did not want to sit fully upright, needed pilllows to support positioning. Pt toelrated a few bites of applesauce without difficulty with Nsg while ST observed. Noted delayed coughing after sips of thin. Will alter diet to Dys 1 with nectar thick liqudis for now only to be fed when fully alert. Further assessment indicated during a dysphagia treatment session when more alert and able to partcipate. SLP Visit Diagnosis: Dysphagia, unspecified (R13.10)    Aspiration Risk  Moderate aspiration risk    Diet Recommendation Dysphagia 1 (Puree);Nectar-thick liquid   Medication Administration: Crushed with puree Compensations: Small sips/bites;Minimize environmental distractions Postural Changes: Seated upright at 90 degrees;Remain upright for at least 30 minutes after po intake    Other  Recommendations      Recommendations for follow up therapy  are one component of a multi-disciplinary discharge planning process, led by the attending physician.  Recommendations may be updated based on patient status, additional functional criteria and insurance authorization.  Follow up Recommendations Skilled Nursing facility      Frequency and Duration   F/u 2-3 x week         Prognosis Prognosis for Safe Diet Advancement: Fair      Swallow Study   General Date of Onset: 12/09/20 HPI: Per admitting H&P "Jamie Benitez  is a 75 y.o. female with medical history significant of sCHF with EF 35%, CAD with stent placement, CKD-3A, chronic back pain, Bell's palsy, HTN, hyperlipidemia, diabetes mellitus, GERD, depression with anxiety, who presents with weakness.     Patient states that she has generalized weakness for more than 4 days.  No unilateral numbness or tingling to extremities.  No facial droop or slurred speech.  Patient has dry cough, denies chest pain or shortness breath.  No fever or chills.  Patient states that she had diarrhea in the past several days, which has resolved.  Currently no nausea, vomiting, diarrhea or abdominal pain.  No symptoms of UTI.  Patient complains of chronic lower back pain. Initially patient had oxygen desaturation to mid 80s, which improved to 98% on room air in ED.     Initial trop 105 with ST depression and T wave inversion in inferior leads and anterior leads.  Dr. Okey Dupre of cardiology was consulted, who did not think patient had STEMI. He recommended [facility] cardiology consult. IV heparin is started in ED" Type of Study: Bedside Swallow Evaluation Diet Prior to this Study: Regular Respiratory Status: Room air Behavior/Cognition: Confused;Lethargic/Drowsy;Requires cueing Self-Feeding Abilities: Total assist Patient Positioning: Upright in bed Baseline Vocal Quality: Hoarse    Oral/Motor/Sensory Function     Ice Chips Ice chips: Within functional limits   Thin Liquid Thin Liquid: Impaired Presentation: Cup;Straw (delayed cough) Pharyngeal  Phase Impairments: Cough - Delayed    Nectar Thick Nectar Thick Liquid: Not tested   Honey Thick     Puree Puree: Within functional limits Presentation: Spoon   Solid     Solid: Not tested      Jamie Benitez 12/14/2020,2:00 PM

## 2020-12-15 ENCOUNTER — Inpatient Hospital Stay: Payer: Medicare Other

## 2020-12-15 DIAGNOSIS — B9561 Methicillin susceptible Staphylococcus aureus infection as the cause of diseases classified elsewhere: Secondary | ICD-10-CM | POA: Diagnosis not present

## 2020-12-15 DIAGNOSIS — R7881 Bacteremia: Secondary | ICD-10-CM | POA: Diagnosis not present

## 2020-12-15 DIAGNOSIS — N179 Acute kidney failure, unspecified: Secondary | ICD-10-CM | POA: Diagnosis not present

## 2020-12-15 LAB — CULTURE, BLOOD (ROUTINE X 2)
Culture: NO GROWTH
Culture: NO GROWTH
Special Requests: ADEQUATE
Special Requests: ADEQUATE

## 2020-12-15 LAB — HEPATITIS B DNA, ULTRAQUANTITATIVE, PCR
HBV DNA SERPL PCR-ACNC: NOT DETECTED IU/mL
HBV DNA SERPL PCR-LOG IU: UNDETERMINED log10 IU/mL

## 2020-12-15 LAB — BASIC METABOLIC PANEL
Anion gap: 14 (ref 5–15)
BUN: 66 mg/dL — ABNORMAL HIGH (ref 8–23)
CO2: 27 mmol/L (ref 22–32)
Calcium: 7 mg/dL — ABNORMAL LOW (ref 8.9–10.3)
Chloride: 92 mmol/L — ABNORMAL LOW (ref 98–111)
Creatinine, Ser: 5.41 mg/dL — ABNORMAL HIGH (ref 0.44–1.00)
GFR, Estimated: 8 mL/min — ABNORMAL LOW (ref >60)
Glucose, Bld: 127 mg/dL — ABNORMAL HIGH (ref 70–99)
Potassium: 3.3 mmol/L — ABNORMAL LOW (ref 3.5–5.1)
Sodium: 133 mmol/L — ABNORMAL LOW (ref 135–145)

## 2020-12-15 LAB — HEPATITIS B SURFACE ANTIBODY, QUANTITATIVE: Hep B S AB Quant (Post): <3.1 m[IU]/mL — ABNORMAL LOW (ref >9.9)

## 2020-12-15 LAB — GLUCOSE, CAPILLARY
Glucose-Capillary: 127 mg/dL — ABNORMAL HIGH (ref 70–99)
Glucose-Capillary: 125 mg/dL — ABNORMAL HIGH (ref 70–99)
Glucose-Capillary: 200 mg/dL — ABNORMAL HIGH (ref 70–99)

## 2020-12-15 LAB — HEPATIC FUNCTION PANEL
ALT: <5 U/L (ref 0–44)
AST: 34 U/L (ref 15–41)
Albumin: 1.7 g/dL — ABNORMAL LOW (ref 3.5–5.0)
Alkaline Phosphatase: 115 U/L (ref 38–126)
Bilirubin, Direct: 0.1 mg/dL (ref 0.0–0.2)
Indirect Bilirubin: 0.8 mg/dL (ref 0.3–0.9)
Total Bilirubin: 0.9 mg/dL (ref 0.3–1.2)
Total Protein: 4.8 g/dL — ABNORMAL LOW (ref 6.5–8.1)

## 2020-12-15 LAB — HEPARIN LEVEL (UNFRACTIONATED): Heparin Unfractionated: 0.32 IU/mL (ref 0.30–0.70)

## 2020-12-15 LAB — CBC
HCT: 22.6 % — ABNORMAL LOW (ref 36.0–46.0)
Hemoglobin: 8.4 g/dL — ABNORMAL LOW (ref 12.0–15.0)
MCH: 27.8 pg (ref 26.0–34.0)
MCHC: 37.2 g/dL — ABNORMAL HIGH (ref 30.0–36.0)
MCV: 74.8 fL — ABNORMAL LOW (ref 80.0–100.0)
Platelets: 193 10*3/uL (ref 150–400)
RBC: 3.02 MIL/uL — ABNORMAL LOW (ref 3.87–5.11)
RDW: 16 % — ABNORMAL HIGH (ref 11.5–15.5)
WBC: 10.5 10*3/uL (ref 4.0–10.5)
nRBC: 0 % (ref 0.0–0.2)

## 2020-12-15 LAB — PHOSPHORUS: Phosphorus: 4.9 mg/dL — ABNORMAL HIGH (ref 2.5–4.6)

## 2020-12-15 LAB — HEPATITIS B E ANTIGEN: Hep B E Ag: NEGATIVE

## 2020-12-15 LAB — CK: Total CK: 437 U/L — ABNORMAL HIGH (ref 38–234)

## 2020-12-15 IMAGING — US US CAROTID DUPLEX BILAT
1 series · 13 of 24 positions shown · non-contrast
Comparison: None.

CLINICAL DATA: 72-year-old female with a history of stroke

EXAM:
BILATERAL CAROTID DUPLEX ULTRASOUND
TECHNIQUE: Gray scale imaging, color Doppler and duplex ultrasound were
performed of bilateral carotid and vertebral arteries in the neck.

[Series 1: us carotid bilateral · 13 of 60 slices shown]
[im 1/60]
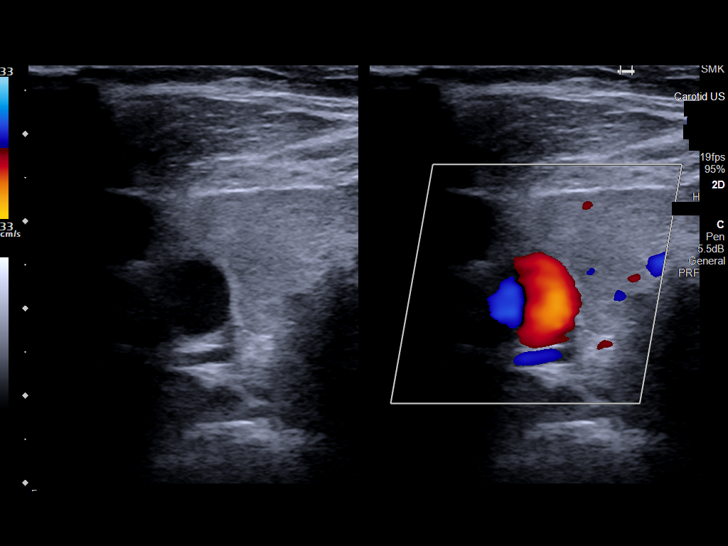
[im 6/60]
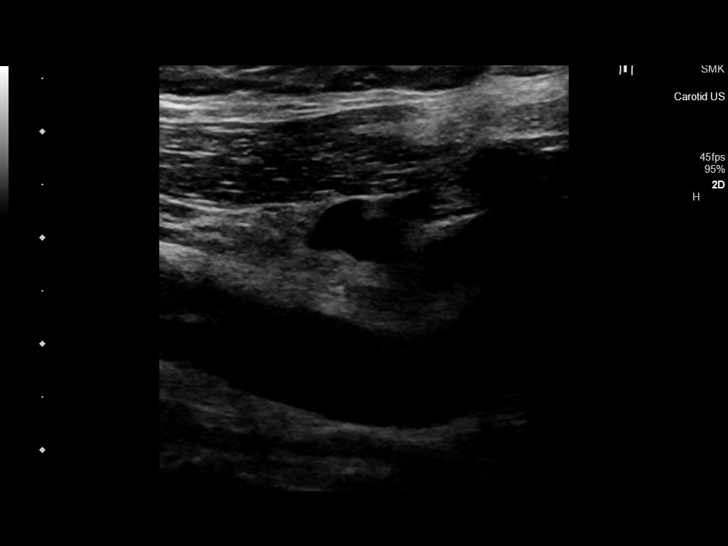
[im 11/60]
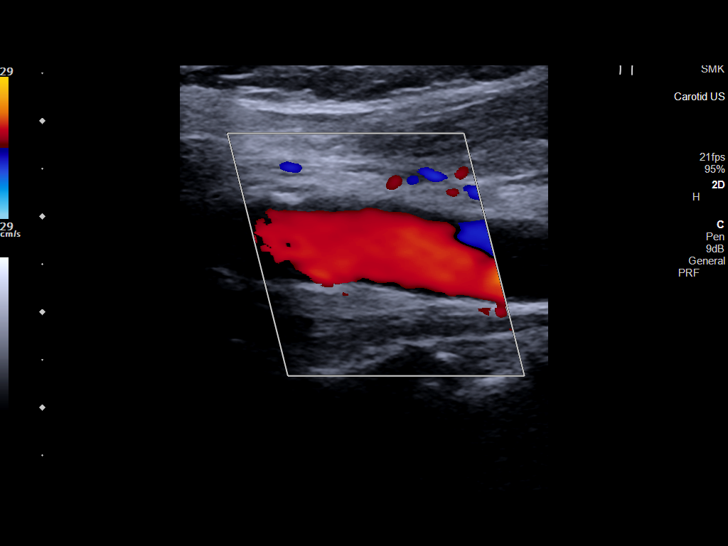
[im 16/60]
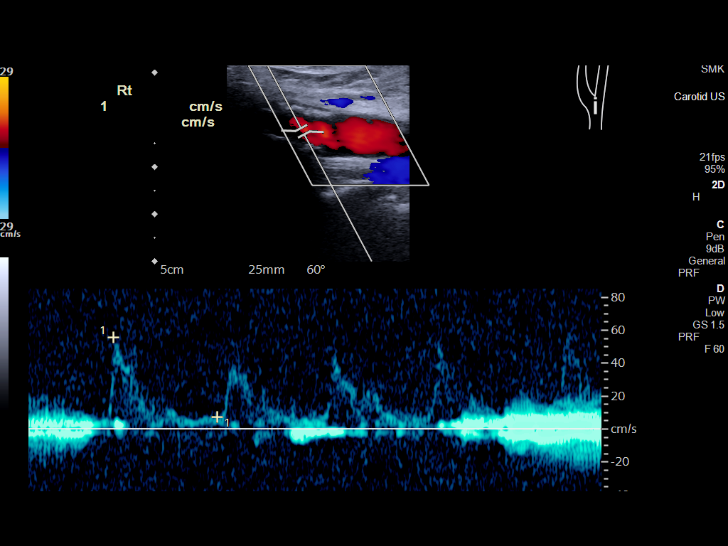
[im 21/60]
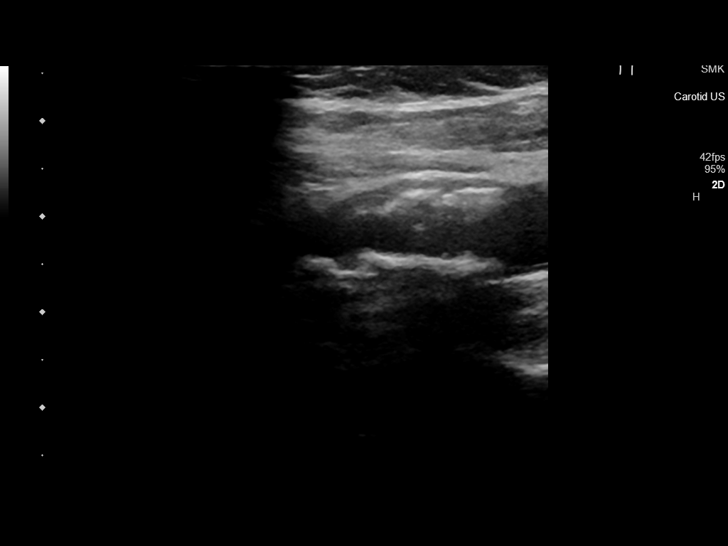
[im 26/60]
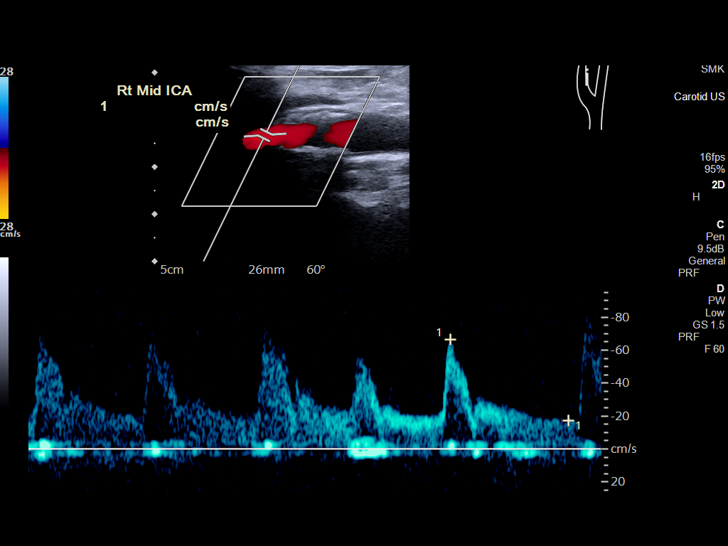
[im 31/60]
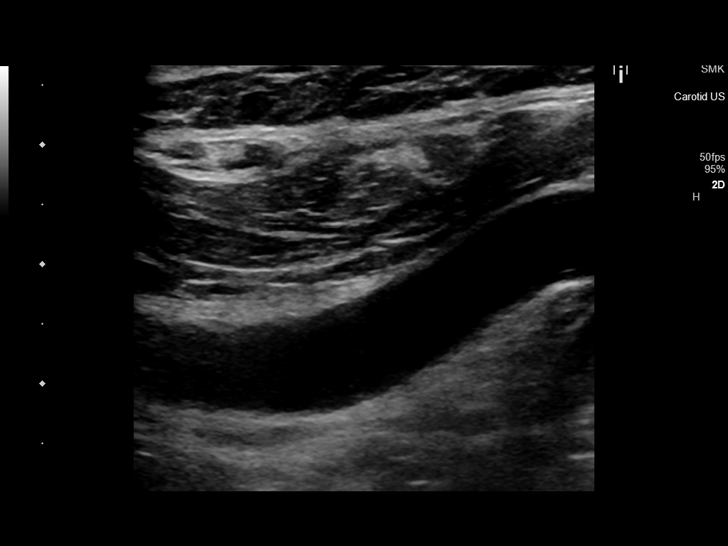
[im 34/60]
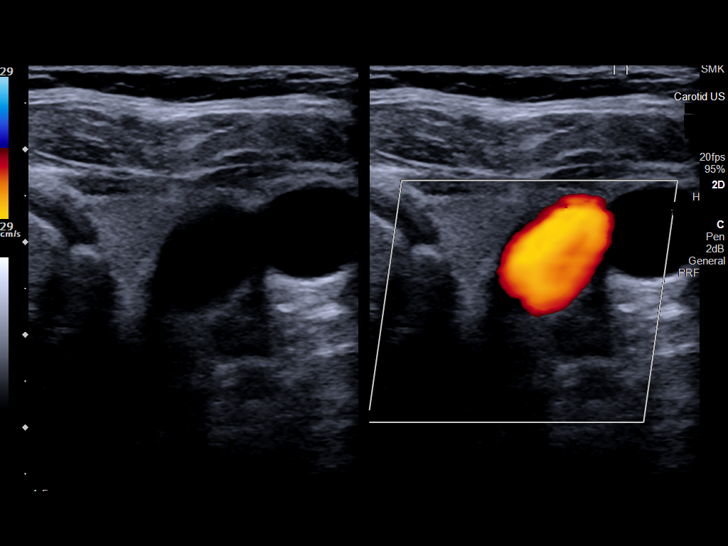
[im 39/60]
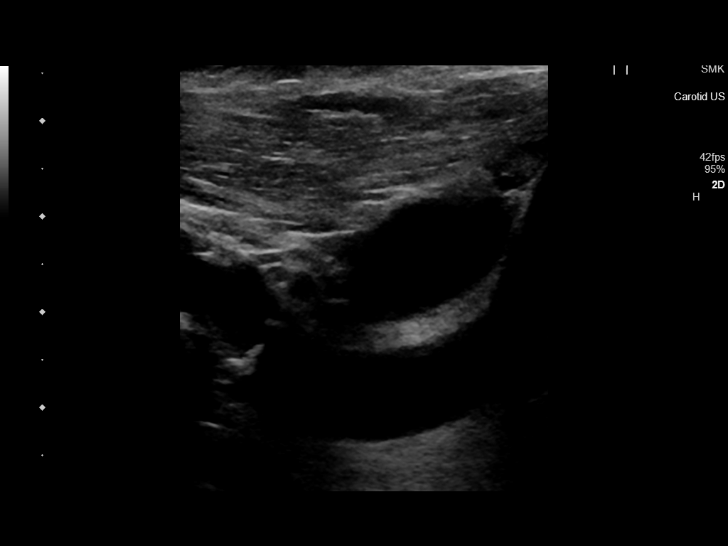
[im 44/60]
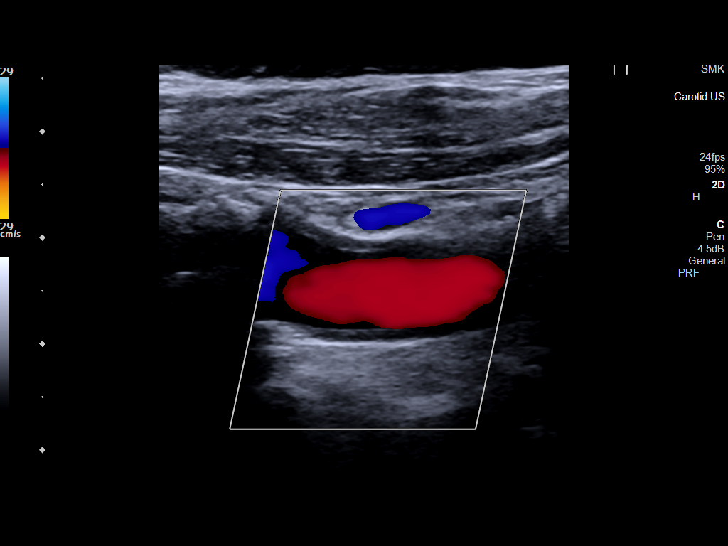
[im 49/60]
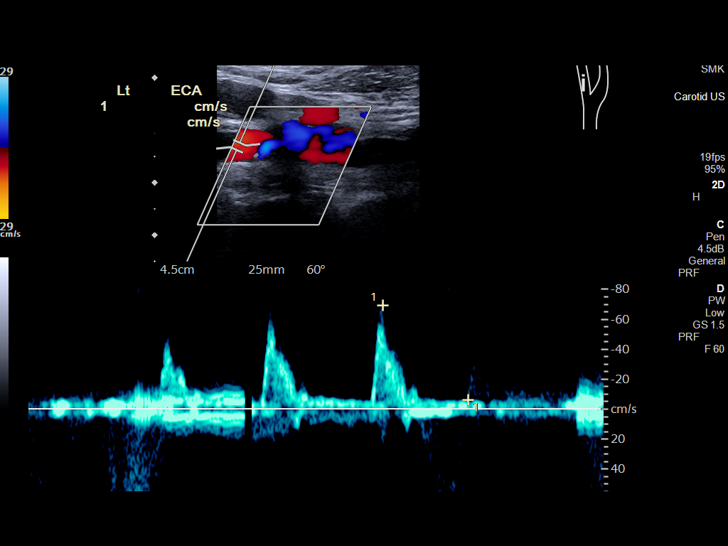
[im 54/60]
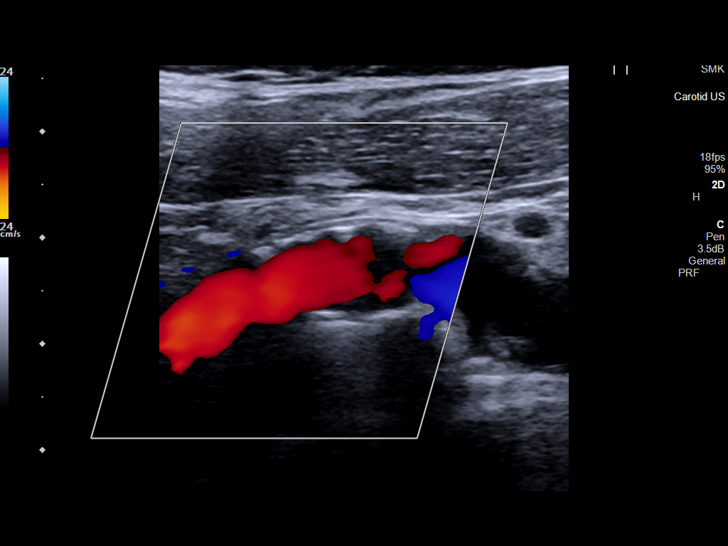
[im 60/60]
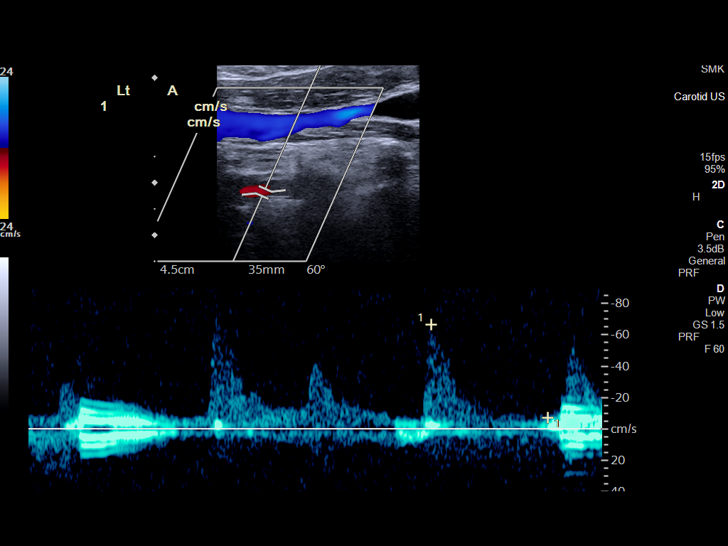

[13 of 24 positions shown; findings below may reference images not displayed]

FINDINGS: Criteria: Quantification of carotid stenosis is based on velocity
parameters that correlate the residual internal carotid diameter
with NASCET-based stenosis levels, using the diameter of the distal
internal carotid lumen as the denominator for stenosis measurement.

The following velocity measurements were obtained:

RIGHT

ICA:  Systolic 86 cm/sec, Diastolic 17 cm/sec

CCA:  82 cm/sec

SYSTOLIC ICA/CCA RATIO:

ECA:  74 cm/sec

LEFT

ICA:  Systolic 83 cm/sec, Diastolic 25 cm/sec

CCA:  103 cm/sec

SYSTOLIC ICA/CCA RATIO:

ECA:  69 cm/sec

Right Brachial SBP: Not acquired

Left Brachial SBP: Not acquired

RIGHT CAROTID ARTERY: No significant calcifications of the right
common carotid artery. Intermediate waveform maintained. Moderate
heterogeneous and partially calcified plaque at the right carotid
bifurcation. No significant lumen shadowing. Low resistance waveform
of the right ICA. No significant tortuosity.

RIGHT VERTEBRAL ARTERY: Antegrade flow with low resistance waveform.

LEFT CAROTID ARTERY: No significant calcifications of the left
common carotid artery. Intermediate waveform maintained. Moderate
heterogeneous and partially calcified plaque at the left carotid
bifurcation. No significant lumen shadowing. Low resistance waveform
of the left ICA. No significant tortuosity.

LEFT VERTEBRAL ARTERY:  Antegrade flow with low resistance waveform.
IMPRESSION: Color duplex indicates moderate heterogeneous and calcified plaque,
with no hemodynamically significant stenosis by duplex criteria in
the extracranial cerebrovascular circulation.

## 2020-12-15 IMAGING — MR MR MRA NECK W/O CM
3 of 5 series · 33 of 48 positions shown · non-contrast
Comparison: No pertinent prior exam.

CLINICAL DATA: Acute neurologic deficit

EXAM:
MRA NECK WITHOUT CONTRAST
TECHNIQUE: Angiographic images of the neck were acquired using MRA technique
without intravenous contrast. Carotid stenosis measurements (when
applicable) are obtained utilizing NASCET criteria, using the distal
internal carotid diameter as the denominator.

[Series 10: TOF · axial · B · 0.5mm · 0.59mm/px · z∈[-77,+58]mm · 30 of 304 slices shown (1 of 3)]
[im 1/304]
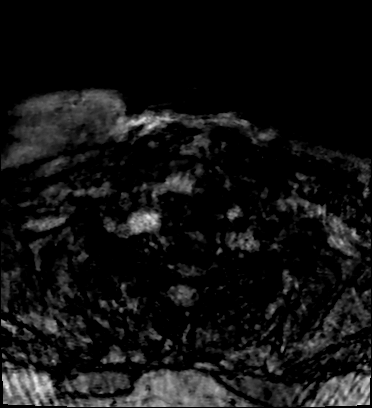
[im 8/304]
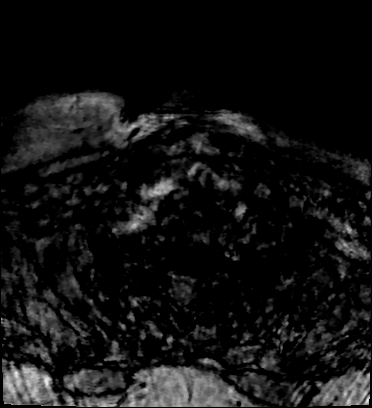
[im 16/304]
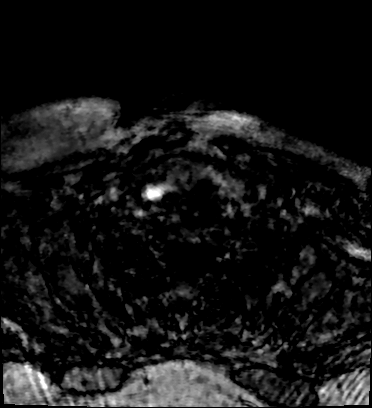
[im 24/304]
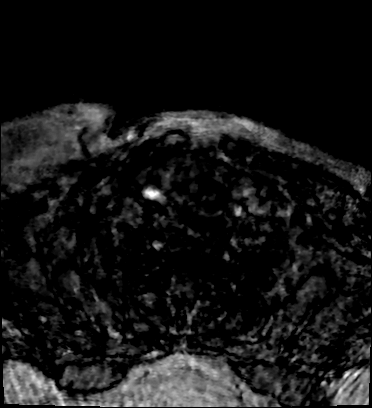
[im 32/304]
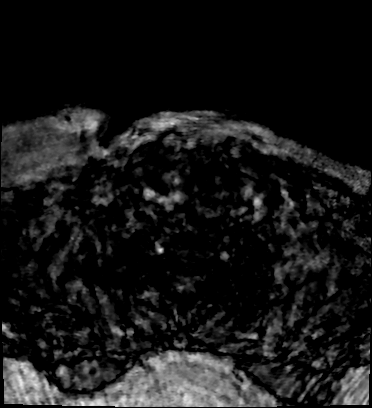
[im 40/304]
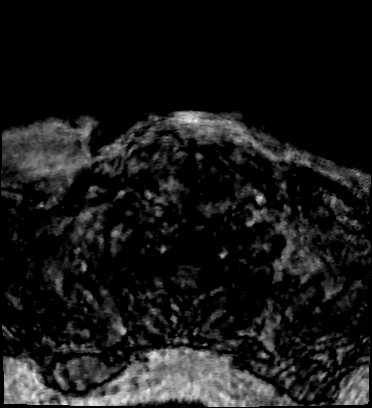
[im 48/304]
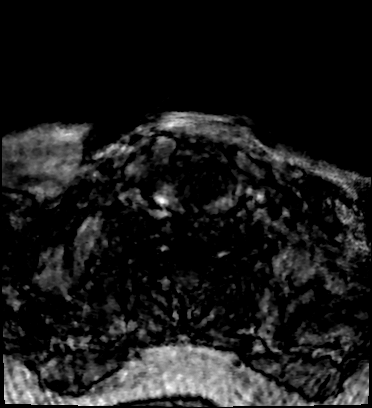
[im 56/304]
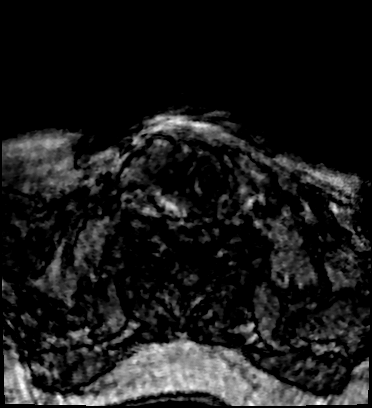
[im 64/304]
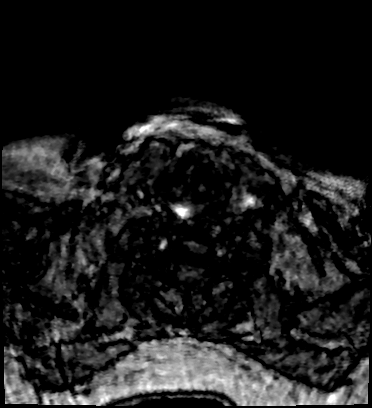
[im 72/304]
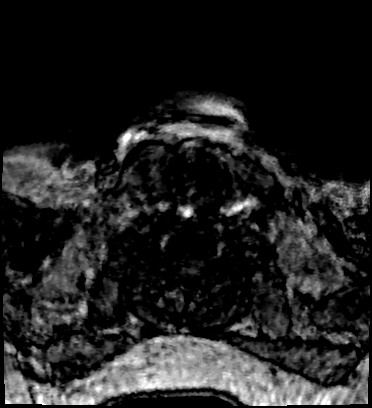
[im 80/304]
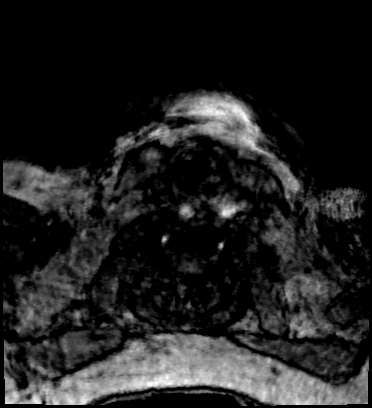
[im 88/304]
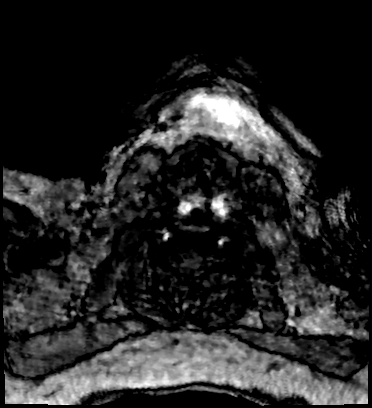
[im 96/304]
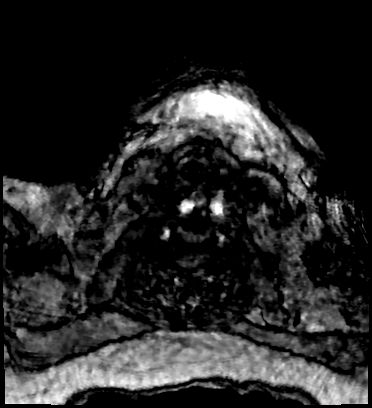
[im 104/304]
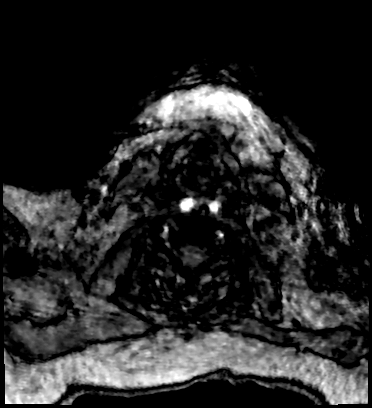
[im 112/304]
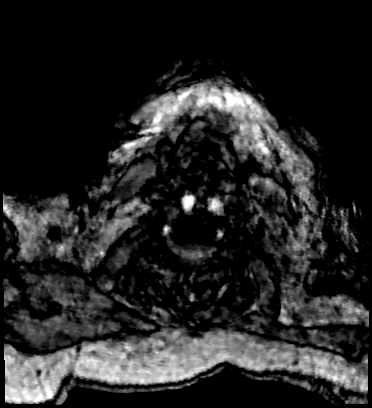
[im 120/304]
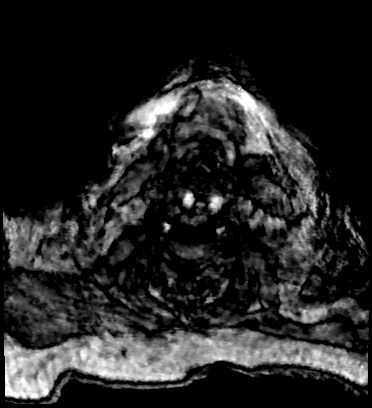
[im 128/304]
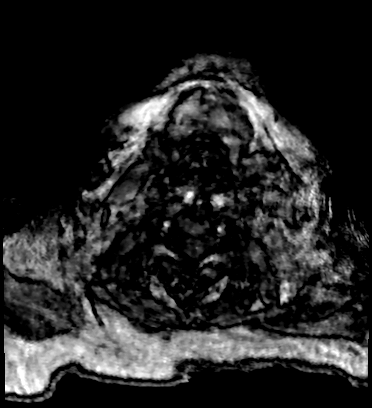
[im 136/304]
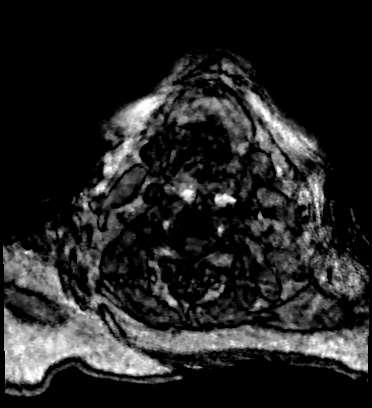
[im 144/304]
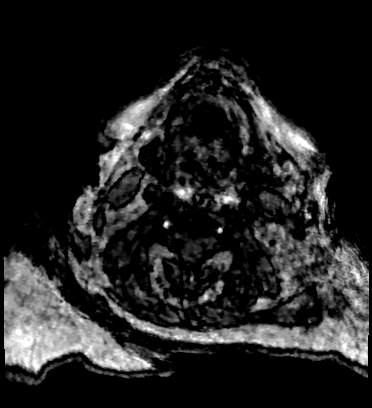
[im 152/304]
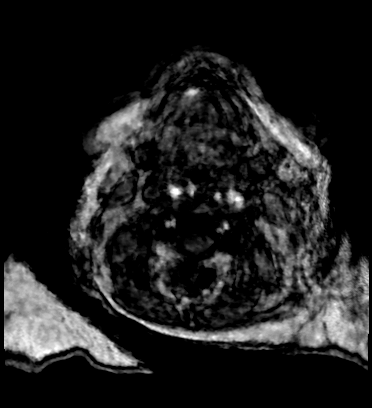
[im 160/304]
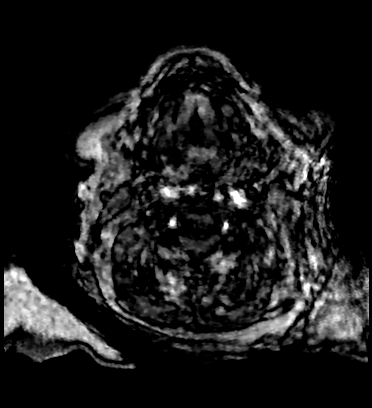
[im 168/304]
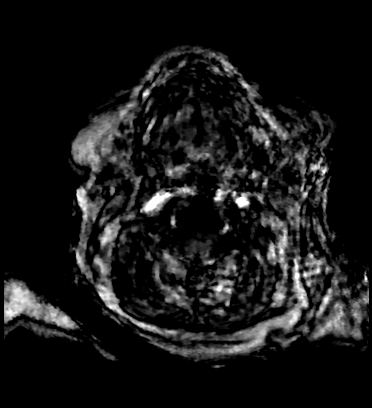
[im 176/304]
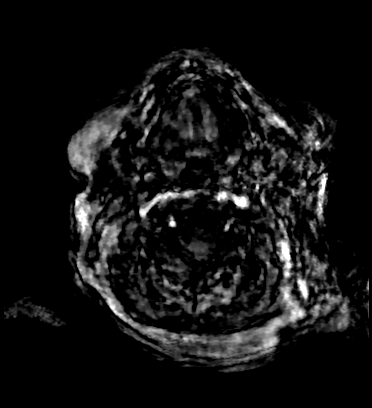
[im 184/304]
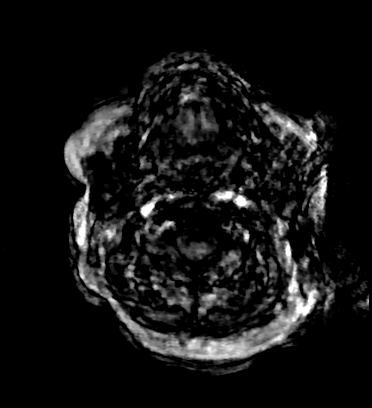
[im 192/304]
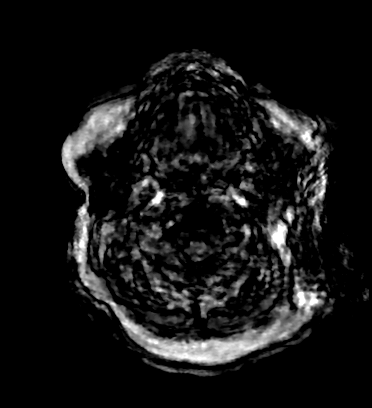
[im 200/304]
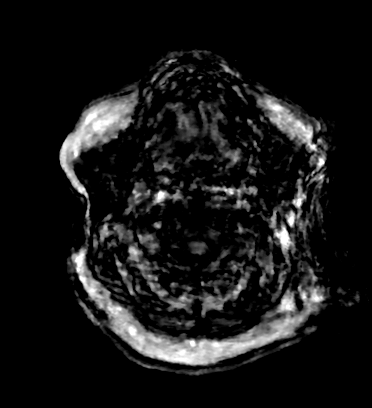
[im 208/304]
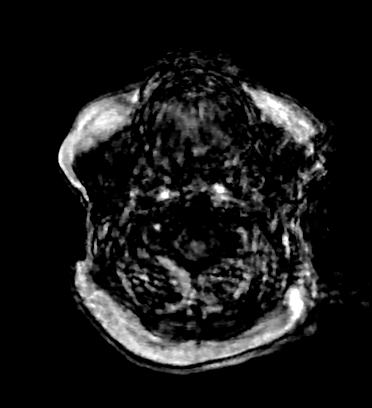
[im 248/304]
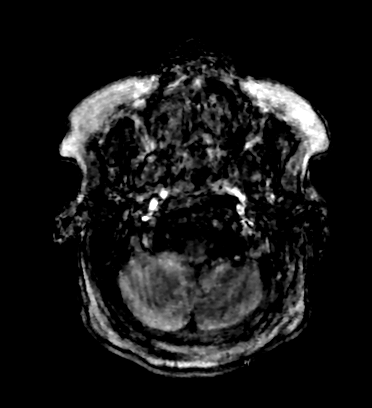
[im 256/304]
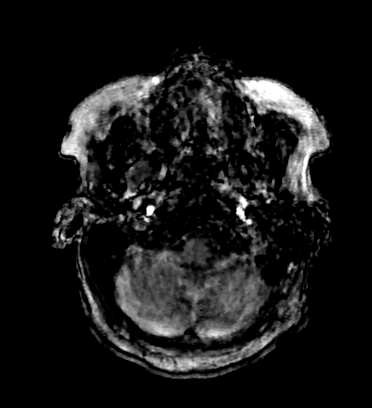
[im 288/304]
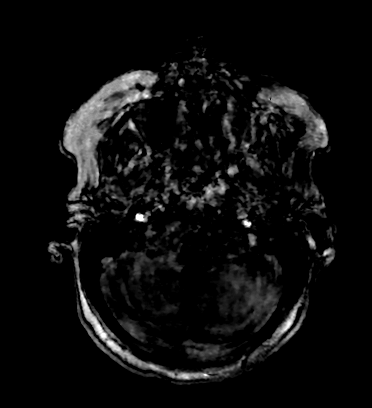

[Series 13: TOF · axial · B · 142.9mm · 0.59mm/px · 1 of 1 slices shown (2 of 3)]
[im 1/1]
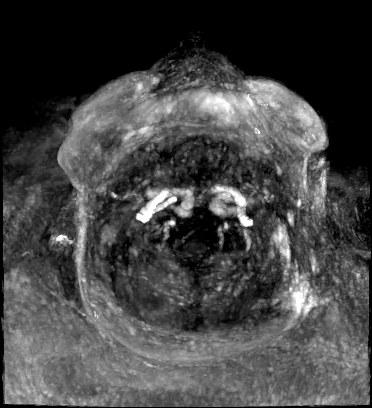

[Series 14: TOF · sagittal · B · 0.47mm/px · 2 of 12 slices shown (3 of 3)]
[im 1/12]
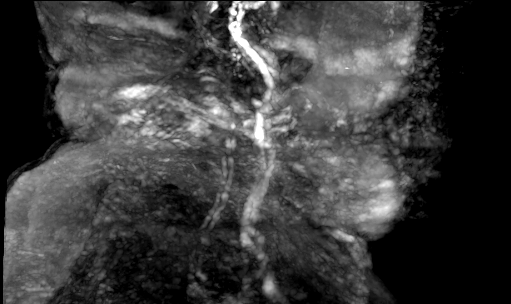
[im 12/12]
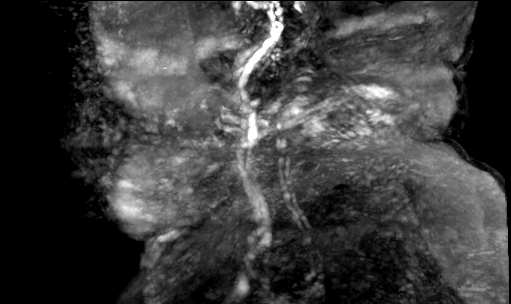

[33 of 48 positions shown; findings below may reference images not displayed]

FINDINGS: The images are severely degraded by motion. Within that limitation,
the carotid arteries appear to be patent from the bifurcation to the
skull base. The V2 segments of the vertebral arteries also appear to
be patent.
IMPRESSION: Severely degraded by motion. Within that limitation, there is no
visualized occlusion of the carotid or vertebral arteries.

## 2020-12-15 MED ORDER — ACETAMINOPHEN 325 MG PO TABS
ORAL_TABLET | ORAL | Status: AC
  Filled 2020-12-15: qty 2

## 2020-12-15 MED ORDER — HEPARIN SODIUM (PORCINE) 5000 UNIT/ML IJ SOLN
5000.0000 [IU] | Freq: Three times a day (TID) | INTRAMUSCULAR | Status: DC
  Administered 2020-12-15 – 2020-12-17 (×6): 5000 [IU] via SUBCUTANEOUS
  Filled 2020-12-15 (×6): qty 1

## 2020-12-15 MED ORDER — MIDODRINE HCL 5 MG PO TABS
ORAL_TABLET | ORAL | Status: AC
  Filled 2020-12-15: qty 2

## 2020-12-15 MED ORDER — SODIUM CHLORIDE 0.9 % IV SOLN
250.0000 mg | INTRAVENOUS | Status: DC | PRN
  Administered 2020-12-16: 250 mg via INTRAVENOUS
  Filled 2020-12-15 (×2): qty 2.5

## 2020-12-15 MED ORDER — LEVETIRACETAM IN NACL 500 MG/100ML IV SOLN
500.0000 mg | INTRAVENOUS | Status: DC
  Administered 2020-12-15 – 2020-12-17 (×3): 500 mg via INTRAVENOUS
  Filled 2020-12-15 (×3): qty 100

## 2020-12-15 NOTE — Evaluation (Signed)
Occupational Therapy Re-Evaluation Patient Details Name: Jamie Benitez MRN: [id number] DOB: 07/13/1945 Today's Date: 12/15/2020   History of Present Illness Pt admitted to [facility] on 12/09/20 for c/o progressive generalized weakness and hypoxia, with sat in 80's at home on RA. Significant PMH includes: sCHF (EF 35%), CAD with stent placement, CKD (3a), cLBP, Bell's Palsy, HTN, HLD, diabetes, GERD, depression, anxiety. Elevated troponin likely due to demand ischemia and not MI. Developed tachyarrhythmia with Afib as well as worsening AKI, hypotension, and was transferred to SD/ICU for pressor support, amiodarone gtt, and nephrology consult. Spine MRI negative for spinal abscess. 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   Clinical Impression   Upon entering the room, pt supine in bed and resting comfortably. Pt needing encouragement and total A of 2 for bed mobility to EOB. Pt needing total A of 1 for support with static sitting balance. Pt reports increased pain with any movement in back and RN notified for pain medication. Pt limited to EOB secondary to temp IJ cath for dialysis. Pt unable to maintain balance on EOB. Total A for grooming tasks. Pt fatigues quickly and requires total A of 2 to return to bed and for repositioning for comfort. L hand noted to have increased edema and RN notified as well. Pt re- evaluated secondary to new L parietal CVA on 9/25. Pt's goals downgraded. OT continues to work on goals with pt and recommendation for SNF in order to reduce caregiver burden.      Recommendations for follow up therapy are one component of a multi-disciplinary discharge planning process, led by the attending physician.  Recommendations may be updated based on patient status, additional functional criteria and insurance authorization.   Follow Up Recommendations  SNF;Supervision/Assistance - 24 hour    Equipment Recommendations  Other (comment)  (defer to next venue of care)       Precautions / Restrictions Precautions Precautions: Fall Precaution Comments: temp R interjugular HD port - restricted to chair position on EOB Restrictions Weight Bearing Restrictions: No Other Position/Activity Restrictions: Restricted to sitting EOB      Mobility Bed Mobility Overal bed mobility: Needs Assistance Bed Mobility: Supine to Sit;Sit to Supine Rolling: Total assist   Supine to sit: +2 for physical assistance;Total assist Sit to supine: Total assist;+2 for physical assistance   General bed mobility comments: heavy assist secondary to pain with movement and weakness    Transfers Overall transfer level: Needs assistance               General transfer comment: Deferred/unable due to R IJ HD port    Balance Overall balance assessment: Needs assistance Sitting-balance support: Feet supported;Bilateral upper extremity supported Sitting balance-Leahy Scale: Zero Sitting balance - Comments: Required Total A +1 to maintain static sitting balance. Attempts made for BUE support to assess if pt could maintain sitting balance but unable to maintain without total assist.     Standing balance-Leahy Scale: Zero Standing balance comment: deferred/unsafe                           ADL either performed or assessed with clinical judgement   ADL Overall ADL's : Needs assistance/impaired     Grooming: Wash/dry face;Total assistance;Sitting  Vision Baseline Vision/History: 2 Legally blind (L eye) Patient Visual Report: No change from baseline              Pertinent Vitals/Pain Pain Assessment: Faces Faces Pain Scale: Hurts whole lot Pain Location: back Pain Descriptors / Indicators: Aching;Discomfort;Grimacing;Guarding Pain Intervention(s): Limited activity within patient's tolerance;Monitored during session;Patient requesting pain meds-RN notified     Hand  Dominance Right   Extremity/Trunk Assessment Upper Extremity Assessment Upper Extremity Assessment: Generalized weakness   Lower Extremity Assessment Lower Extremity Assessment: Generalized weakness;RLE deficits/detail;LLE deficits/detail RLE Sensation: WNL   Cervical / Trunk Assessment Cervical / Trunk Assessment: Normal   Communication Communication Communication: HOH   Cognition Arousal/Alertness: Awake/alert Behavior During Therapy: WFL for tasks assessed/performed Overall Cognitive Status: Difficult to assess                                 General Comments: Pt able to follow simple commands. She grunts and moans throughout sessions and occassionally answers with a one worded response.   General Comments  calluses on plantar surface of B feet primarily at first MTP joints    Exercises Other Exercises Other Exercises: Attempts to assess bed mobility, UE/LE mobility/use during functional mobility.   Shoulder Instructions      Home Living Family/patient expects to be discharged to:: Private residence Living Arrangements: Other relatives Available Help at Discharge: Family;Available PRN/intermittently Type of Home: House Home Access: Stairs to enter [employer] of Steps: 3 Entrance Stairs-Rails: Right;Left Home Layout: One level     Bathroom Shower/Tub: Chief Strategy Officer: Handicapped height     Home Equipment: Environmental consultant - 2 wheels;Cane - single point          Prior Functioning/Environment Level of Independence: Independent        Comments: Pt reports living with grandson who works during the day and "is not very helpful". She reports being independent at baseline with mobility and self care tasks. States she uses RW vs. SPC PRN for ambulation. Ind with medication management. Doesn't drive or wear O2 at baseline.                 OT Goals(Current goals can be found in the care plan section) Acute Rehab OT  Goals Patient Stated Goal: to decrease pain OT Goal Formulation: With patient Time For Goal Achievement: 12/29/20 Potential to Achieve Goals: Fair ADL Goals Pt Will Perform Grooming: with min assist;sitting Pt Will Perform Lower Body Dressing: with mod assist;bed level Pt Will Transfer to Toilet: with mod assist;bedside commode Pt Will Perform Toileting - Clothing Manipulation and hygiene: with mod assist;sit to/from stand  OT Frequency: Min 2X/week           Co-evaluation PT/OT/SLP Co-Evaluation/Treatment: Yes Reason for Co-Treatment: Complexity of the patient's impairments (multi-system involvement);Necessary to address cognition/behavior during functional activity;For patient/therapist safety;To address functional/ADL transfers PT goals addressed during session: Mobility/safety with mobility OT goals addressed during session: ADL's and self-care;Strengthening/ROM      AM-PAC OT "6 Clicks" Daily Activity     Outcome Measure Help from another person eating meals?: Total Help from another person taking care of personal grooming?: Total Help from another person toileting, which includes using toliet, bedpan, or urinal?: Total Help from another person bathing (including washing, rinsing, drying)?: Total Help from another person to put on and taking off regular upper body clothing?: Total Help from another person to put on and taking off regular  lower body clothing?: Total 6 Click Score: 6   End of Session Nurse Communication: Mobility status;Patient requests pain meds;Other (comment) (prevalon boots)  Activity Tolerance: Patient limited by pain Patient left: in bed;with call bell/phone within reach;with bed alarm set  OT Visit Diagnosis: Unsteadiness on feet (R26.81);Muscle weakness (generalized) (M62.81)                Time: 9977-4142 OT Time Calculation (min): 22 min Charges:  OT General Charges $OT Visit: 1 Visit OT Evaluation $OT Re-eval: 1 Re-eval OT Treatments $Self  Care/Home Management : 8-22 mins  Jackquline Denmark, MS, OTR/L , CBIS ascom [phone]  12/15/20, 3:53 PM

## 2020-12-15 NOTE — Progress Notes (Signed)
PROGRESS NOTE    Jamie Benitez  STM:[id number] DOB: 1945/11/27 DOA: 12/09/2020 PCP: Derinda Late, MD    Brief Narrative:  75 y.o. female with medical history significant of sCHF with EF 35%, CAD with stent placement, CKD-3A, chronic back pain, Bell's palsy, HTN, hyperlipidemia, diabetes mellitus, GERD, depression with anxiety, who presents with weakness.   Patient states that she has generalized weakness for more than 4 days.  No unilateral numbness or tingling to extremities.  No facial droop or slurred speech.  Patient has dry cough, denies chest pain or shortness breath.  No fever or chills.  Patient states that she had diarrhea in the past several days, which has resolved.  Currently no nausea, vomiting, diarrhea or abdominal pain.  No symptoms of UTI.  Patient complains of chronic lower back pain. Initially patient had oxygen desaturation to mid 80s, which improved to 98% on room air in ED.   Initial trop 105 with ST depression and T wave inversion in inferior leads and anterior leads.  Dr. Saunders Revel of cardiology was consulted, who did not think patient had STEMI. He recommended [facility] cardiology consult. IV heparin is started in ED  Cardiology patient's presentation is inconsistent with NSTEMI.  No significant delta in troponin.  VQ scan and lower extremity duplex negative for VTE.  Patient did have elements of mild BNP elevation and small left pleural effusion possible decompensated heart failure.  Was not given diuretics due to elevated kidney function.  Subsequently patient went into a tachyarrhythmia likely atrial fibrillation which she does not have a history of.  Also creatinine worsened from 2-4.2.  Nephrology consult 9/22.  9/23: Patient was moved to stepdown unit for closer monitoring due to persistent hypotension.  Not requiring vasopressor support.  Remains on amiodarone.  Remains in atrial fibrillation, rate control improved.  Kidney function continues to deteriorate.  Discussed with  nephrology.  Patient still undecided about initiation of hemodialysis  9/25: Early this morning patient had RRT called for decreased level of mentation and possible seizure activity.  Neurology consult requested.  Stat CT head negative for mass or bleed.  ABG with mild hypoxia but otherwise reassuring.  Neurology ordering phenytoin and routine EEG as well as MRI brain.  Patient remains encephalopathic and unable to provide history.  9/26: Patient little bit more awake today.  Grandson at bedside.  Patient had temporary dialysis line placed in right IJ on 9/25.  To undergo hemodialysis today.   Assessment & Plan:   Principal Problem:   NSTEMI (non-ST elevated myocardial infarction) ([place]) Active Problems:   Chronic systolic CHF (congestive heart failure) (HCC)   Chronic back pain   Acute renal failure (HCC)   Depression with anxiety   Type II diabetes mellitus with renal manifestations (HCC)   CAD (coronary artery disease)   HLD (hyperlipidemia)   HTN (hypertension)   Hypokalemia   Leukocytosis   Acute decompensated heart failure (HCC)   Pressure injury of skin   Generalized weakness   DNR (do not resuscitate)   Palliative care by specialist  Possible new onset seizure 8 mm acute infarct in left parietal white matter Patient had shaking activity and encephalopathy noted early 9/25 Seizure activity versus uremic encephalopathy primary differentials Neurology consulted CT head negative ABG with mild hypoxia otherwise reassuring MRI brain with small acute infarct Plan: Antiepileptic regimen changed to Keppra Continue DAPT Seizure precautions Neurology follow-up Discontinue IV heparin  Acute renal failure superimposed on stage 3a chronic kidney disease ([place]):  Metabolic acidosis  Hyponatremia recent baseline creatinine 1.2 on 10/28/2019.   Her creatinine is at 2.02, BUN 35 on admission Creatinine progressively worsening Nephrology consulted Dialysis catheter placed 9/25 HD  9/26 Plan: Repeat HD today  Possible MSSA bacteremia Unclear what this this represents infection or true contaminant Does not meet sepsis criteria No clear source identified TTE negative MRI spine negative Plan: Continue Ancef If patient's mental status improves we will request TEE from cardiology Appreciate ID follow-up  NSTEMI and history of CAD: S/p of stent placement  trop  105 --> 93 --> 93.   Patient does not have chest pain, but has generalized weakness.   Dr. Clayborn Bigness of cardiology is consulted.   D-dimer is positive, but VQ scan negative for PE.   Lower extremity Dopplers negative for DVT. Per cardiology patient's presentation is inconsistent with NSTEMI Plan: Discontinued heparin GTT as infective endocarditis remains on differential Continue GD MT with dual antiplatelet therapy, beta-blocker, ACE inhibitor   New onset atrial fibrillation with rapid ventricular response Noted on telemetry 9/22 Ventricular rates up to 130s Plan: Continue amiodarone 2 mg p.o. twice daily Maintain telemetry monitoring  Chronic systolic CHF (congestive heart failure) ([place]) 2D echo on 05/22/2017 showed EF of 35.  Patient has 1+ leg edema, elevated BNP 398, indicating possible fluid overload.   Patient has progressively deteriorating kidney function.  Oliguric.   Trial of HD to start 9/26.   Chronic back pain -As needed Tylenol -continue home tramadol  Depression with anxiety -Continue home medications   Type II diabetes mellitus with renal manifestations (HCC) Recent A1c 5.8, well controlled.   Patient is taking 70/30 NPH insulin and glucose at home. -Sliding scale insulin -Continue 70/30 NPH 8 units twice daily.  Monitor carefully for hypoglycemia   HLD (hyperlipidemia) -Lipitor   HTN (hypertension) -Hold lisinopril -IV hydralazine as needed -Imdur on hold   Hypokalemia Monitor and replace as necessary Maintain K greater than 4, mag greater than 2   Possible bile  duct dilatation Noted partial on MRI LFTs normal Defer biliary tract imaging for now  DVT prophylaxis: SQ heparin Code Status: DNR Family Communication: [name] [phone] on 9/22.  Daughter at bedside 9/25.  Grandson at bedside 9/26 Disposition Plan: Status is: Inpatient  Remains inpatient appropriate because:Inpatient level of care appropriate due to severity of illness  Dispo: The patient is from: Home              Anticipated d/c is to: SNF              Patient currently is not medically stable to d/c.   Difficult to place patient No  Patient with numerous acute issues requiring hospitalization.  Disposition plan pending.  Prognosis guarded     Level of care: Progressive Cardiac  Consultants:  Nephrology Cardiology  Procedures:  None  Antimicrobials:  Cefazolin   Subjective: Patient seen and examined.  HD today.  Remains encephalopathic.  Objective: Vitals:   12/15/20 1130 12/15/20 1131 12/15/20 1136 12/15/20 1200  BP: (!) 119/96   (!) 126/107  Pulse: 98 79    Resp: 18 20 17 18   Temp:      TempSrc:      SpO2: 95% 99%    Weight:      Height:        Intake/Output Summary (Last 24 hours) at 12/15/2020 1312 Last data filed at 12/15/2020 1131 Gross per 24 hour  Intake --  Output 500 ml  Net -500 ml   [employer]  12/09/20 0354 12/13/20 0503  Weight: 102.1 kg 98.3 kg    Examination:  General exam: Encephalopathic.  Lethargic Respiratory system: Poor respiratory effort.  Bibasilar crackles.  Normal work of breathing.  2 L  cardiovascular system:  S1-S2, regular rate, irregular rhythm, no murmurs Gastrointestinal system: Soft, nontender, nondistended, normal bowel sounds Central nervous system: Encephalopathic.  Oriented to person and place Extremities: Unable to assess power and range of motion Skin: No rashes, lesions or ulcers Psychiatry: Mood and affect flattened.  Insight impaired    Data Reviewed: I have personally reviewed  following labs and imaging studies  CBC: Recent Labs  Lab 12/09/20 0415 12/10/20 0721 12/12/20 0355 12/12/20 1158 12/13/20 0730 12/14/20 0655 12/15/20 0620  WBC 11.4*   < > 16.0* 16.0* 17.6* 12.4* 10.5  NEUTROABS 9.1*  --   --  14.1*  --   --   --   HGB 12.3   < > 9.8* 9.8* 10.0* 8.7* 8.4*  HCT 37.6   < > 27.5* 27.1* 28.6* 23.6* 22.6*  MCV 86.0   < > 78.8* 77.4* 79.9* 74.4* 74.8*  PLT 190   < > 155 173 155 185 193   < > = values in this interval not displayed.   Basic Metabolic Panel: Recent Labs  Lab 12/09/20 0415 12/10/20 0721 12/11/20 1815 12/12/20 1158 12/13/20 0730 12/14/20 0655 12/15/20 0620  NA 134*   < > 128* 128* 128* 131* 133*  K 3.4*   < > 3.5 2.8* 3.7 3.7 3.3*  CL 100   < > 93* 82* 86* 90* 92*  CO2 22   < > 20* 27 19* 26 27  GLUCOSE 238*   < > 97 105* 143* 121* 127*  BUN 35*   < > 70* 75* 80* 89* 66*  CREATININE 2.02*   < > 5.28* 5.06* 6.07* 6.24* 5.41*  CALCIUM 8.5*   < > 6.4* 6.0* 6.3* 6.5* 7.0*  MG 1.8  --   --   --   --   --   --   PHOS  --   --   --   --   --   --  4.9*   < > = values in this interval not displayed.   GFR: Estimated Creatinine Clearance: 9.7 mL/min (A) (by C-G formula based on SCr of 5.41 mg/dL (H)). Liver Function Tests: Recent Labs  Lab 12/09/20 0415 12/11/20 0627 12/11/20 1815 12/12/20 1158  AST 37 76* 101* 101*  ALT 24 13 11 7   ALKPHOS 85 82 93 110  BILITOT 1.2 0.7 0.7 0.7  PROT 6.9 5.3* 4.2* 4.8*  ALBUMIN 3.5 2.3*  2.4* 1.8* 2.0*   Recent Labs  Lab 12/09/20 0415  LIPASE 35   No results for input(s): AMMONIA in the last 168 hours. Coagulation Profile: Recent Labs  Lab 12/09/20 0616  INR 1.1   Cardiac Enzymes: No results for input(s): CKTOTAL, CKMB, CKMBINDEX, TROPONINI in the last 168 hours. BNP (last 3 results) No results for input(s): PROBNP in the last 8760 hours. HbA1C: No results for input(s): HGBA1C in the last 72 hours.  CBG: Recent Labs  Lab 12/13/20 2125 12/14/20 0759 12/14/20 1600  12/14/20 2118 12/15/20 0749  GLUCAP 118* 123* 84 108* 127*   Lipid Profile: No results for input(s): CHOL, HDL, LDLCALC, TRIG, CHOLHDL, LDLDIRECT in the last 72 hours.  Thyroid Function Tests: No results for input(s): TSH, T4TOTAL, FREET4, T3FREE, THYROIDAB in the last 72 hours.  Anemia Panel: No results for  input(s): VITAMINB12, FOLATE, FERRITIN, TIBC, IRON, RETICCTPCT in the last 72 hours. Sepsis Labs: Recent Labs  Lab 12/09/20 0434 12/11/20 0627 12/11/20 0832 12/11/20 1040  LATICACIDVEN 1.8 1.2 1.3 1.7    Recent Results (from the past 240 hour(s))  Resp Panel by RT-PCR (Flu A&B, Covid) Nasopharyngeal Swab     Status: None   Collection Time: 12/09/20  4:34 AM   Specimen: Nasopharyngeal Swab; Nasopharyngeal(NP) swabs in vial transport medium  Result Value Ref Range Status   SARS Coronavirus 2 by RT PCR NEGATIVE NEGATIVE Final    Comment: (NOTE) SARS-CoV-2 target nucleic acids are NOT DETECTED.  The SARS-CoV-2 RNA is generally detectable in upper respiratory specimens during the acute phase of infection. The lowest concentration of SARS-CoV-2 viral copies this assay can detect is 138 copies/mL. A negative result does not preclude SARS-Cov-2 infection and should not be used as the sole basis for treatment or other patient management decisions. A negative result may occur with  improper specimen collection/handling, submission of specimen other than nasopharyngeal swab, presence of viral mutation(s) within the areas targeted by this assay, and inadequate number of viral copies(<138 copies/mL). A negative result must be combined with clinical observations, patient history, and epidemiological information. The expected result is Negative.  Fact Sheet for Patients:  EntrepreneurPulse.com.au  Fact Sheet for Healthcare Providers:  IncredibleEmployment.be  This test is no t yet approved or cleared by the [place] FDA and  has been  authorized for detection and/or diagnosis of SARS-CoV-2 by FDA under an Emergency Use Authorization (EUA). This EUA will remain  in effect (meaning this test can be used) for the duration of the COVID-19 declaration under Section 564(b)(1) of the Act, 21 U.S.C.section 360bbb-3(b)(1), unless the authorization is terminated  or revoked sooner.       Influenza A by PCR NEGATIVE NEGATIVE Final   Influenza B by PCR NEGATIVE NEGATIVE Final    Comment: (NOTE) The Xpert Xpress SARS-CoV-2/FLU/RSV plus assay is intended as an aid in the diagnosis of influenza from Nasopharyngeal swab specimens and should not be used as a sole basis for treatment. Nasal washings and aspirates are unacceptable for Xpert Xpress SARS-CoV-2/FLU/RSV testing.  Fact Sheet for Patients: EntrepreneurPulse.com.au  Fact Sheet for Healthcare Providers: IncredibleEmployment.be  This test is not yet approved or cleared by the [place] FDA and has been authorized for detection and/or diagnosis of SARS-CoV-2 by FDA under an Emergency Use Authorization (EUA). This EUA will remain in effect (meaning this test can be used) for the duration of the COVID-19 declaration under Section 564(b)(1) of the Act, 21 U.S.C. section 360bbb-3(b)(1), unless the authorization is terminated or revoked.  Performed at [facility], [address] [place], [place] [address]   Blood culture (routine single)     Status: Abnormal   Collection Time: 12/09/20  4:34 AM   Specimen: BLOOD  Result Value Ref Range Status   Specimen Description   Final    BLOOD LEFT ARM Performed at [facility], [address] [place], [place] [address]    Special Requests   Final    BOTTLES DRAWN AEROBIC AND ANAEROBIC Blood Culture adequate volume Performed at [facility], [address] [place], [place] [address]    Culture  Setup Time   Final    GRAM POSITIVE COCCI IN BOTH AEROBIC AND  ANAEROBIC BOTTLES CRITICAL RESULT CALLED TO, READ BACK BY AND VERIFIED WITH: Aldona Bar RAEUR AT 9147 ON 12/09/20 BY SS Performed at [facility],  [address] [place], [place] [address]    Culture STAPHYLOCOCCUS AUREUS (A)  Final   Report Status 12/11/2020 FINAL  Final   Organism ID, Bacteria STAPHYLOCOCCUS AUREUS  Final      Susceptibility   Staphylococcus aureus - MIC*    CIPROFLOXACIN 1 SENSITIVE Sensitive     ERYTHROMYCIN <=0.25 SENSITIVE Sensitive     GENTAMICIN <=0.5 SENSITIVE Sensitive     OXACILLIN 0.5 SENSITIVE Sensitive     TETRACYCLINE <=1 SENSITIVE Sensitive     VANCOMYCIN <=0.5 SENSITIVE Sensitive     TRIMETH/SULFA <=10 SENSITIVE Sensitive     CLINDAMYCIN <=0.25 SENSITIVE Sensitive     RIFAMPIN <=0.5 SENSITIVE Sensitive     Inducible Clindamycin NEGATIVE Sensitive     * STAPHYLOCOCCUS AUREUS  Blood Culture ID Panel (Reflexed)     Status: Abnormal   Collection Time: 12/09/20  4:34 AM  Result Value Ref Range Status   Enterococcus faecalis NOT DETECTED NOT DETECTED Final   Enterococcus Faecium NOT DETECTED NOT DETECTED Final   Listeria monocytogenes NOT DETECTED NOT DETECTED Final   Staphylococcus species DETECTED (A) NOT DETECTED Final    Comment: CRITICAL RESULT CALLED TO, READ BACK BY AND VERIFIED WITH: SAMANTHA RAEUR AT 1750 ON 12/09/20 BY SS    Staphylococcus aureus (BCID) DETECTED (A) NOT DETECTED Final    Comment: CRITICAL RESULT CALLED TO, READ BACK BY AND VERIFIED WITH: SAMANTHA RAEUR AT 1750 ON 12/09/20 BY SS    Staphylococcus epidermidis NOT DETECTED NOT DETECTED Final   Staphylococcus lugdunensis NOT DETECTED NOT DETECTED Final   Streptococcus species NOT DETECTED NOT DETECTED Final   Streptococcus agalactiae NOT DETECTED NOT DETECTED Final   Streptococcus pneumoniae NOT DETECTED NOT DETECTED Final   Streptococcus pyogenes NOT DETECTED NOT DETECTED Final   A.calcoaceticus-baumannii NOT DETECTED NOT DETECTED Final   Bacteroides fragilis NOT DETECTED  NOT DETECTED Final   Enterobacterales NOT DETECTED NOT DETECTED Final   Enterobacter cloacae complex NOT DETECTED NOT DETECTED Final   Escherichia coli NOT DETECTED NOT DETECTED Final   Klebsiella aerogenes NOT DETECTED NOT DETECTED Final   Klebsiella oxytoca NOT DETECTED NOT DETECTED Final   Klebsiella pneumoniae NOT DETECTED NOT DETECTED Final   Proteus species NOT DETECTED NOT DETECTED Final   Salmonella species NOT DETECTED NOT DETECTED Final   Serratia marcescens NOT DETECTED NOT DETECTED Final   Haemophilus influenzae NOT DETECTED NOT DETECTED Final   Neisseria meningitidis NOT DETECTED NOT DETECTED Final   Pseudomonas aeruginosa NOT DETECTED NOT DETECTED Final   Stenotrophomonas maltophilia NOT DETECTED NOT DETECTED Final   Candida albicans NOT DETECTED NOT DETECTED Final   Candida auris NOT DETECTED NOT DETECTED Final   Candida glabrata NOT DETECTED NOT DETECTED Final   Candida krusei NOT DETECTED NOT DETECTED Final   Candida parapsilosis NOT DETECTED NOT DETECTED Final   Candida tropicalis NOT DETECTED NOT DETECTED Final   Cryptococcus neoformans/gattii NOT DETECTED NOT DETECTED Final   Meth resistant mecA/C and MREJ NOT DETECTED NOT DETECTED Final    Comment: Performed at [facility], [address] [place] [address], [place], [place] [address]  Urine Culture     Status: Abnormal   Collection Time: 12/09/20  9:24 PM   Specimen: In/Out Cath Urine  Result Value Ref Range Status   Specimen Description   Final    IN/OUT CATH URINE Performed at [facility], [address], [place], [place] [address]    Special Requests   Final    NONE Performed at [facility],  [place], [place] [address]    Culture MULTIPLE SPECIES PRESENT, SUGGEST RECOLLECTION (A)  Final   Report Status 12/11/2020 FINAL  Final  CULTURE, BLOOD (ROUTINE X 2) w Reflex to ID Panel     Status: None   Collection Time: 12/10/20  4:29 PM   Specimen: BLOOD  Result Value Ref Range  Status   Specimen Description BLOOD LEFT ANTECUBITAL  Final   Special Requests   Final    BOTTLES DRAWN AEROBIC ONLY Blood Culture adequate volume   Culture   Final    NO GROWTH 5 DAYS Performed at [facility], [address] [place], [place] [address]    Report Status 12/15/2020 FINAL  Final  CULTURE, BLOOD (ROUTINE X 2) w Reflex to ID Panel     Status: None   Collection Time: 12/10/20  5:19 PM   Specimen: BLOOD  Result Value Ref Range Status   Specimen Description BLOOD BLOOD LEFT FOREARM  Final   Special Requests   Final    BOTTLES DRAWN AEROBIC AND ANAEROBIC Blood Culture adequate volume   Culture   Final    NO GROWTH 5 DAYS Performed at [facility], [address], [place], [place] [address]    Report Status 12/15/2020 FINAL  Final  MRSA Next Gen by PCR, Nasal     Status: None   Collection Time: 12/11/20  4:29 AM   Specimen: Nasal Mucosa; Nasal Swab  Result Value Ref Range Status   MRSA by PCR Next Gen NOT DETECTED NOT DETECTED Final    Comment: (NOTE) The GeneXpert MRSA Assay (FDA approved for NASAL specimens only), is one component of a comprehensive MRSA colonization surveillance program. It is not intended to diagnose MRSA infection nor to guide or monitor treatment for MRSA infections. Test performance is not FDA approved in patients less than 7 years old. Performed at [facility], [address] [place], [place] [address]          Radiology Studies: MR ANGIO HEAD WO CONTRAST  Result Date: 12/13/2020 CLINICAL DATA:  Seizure, abnormal neuro exam. EXAM: MRA HEAD WITHOUT CONTRAST TECHNIQUE: Angiographic images of the Circle of Willis were acquired using MRA technique without intravenous contrast. COMPARISON:  Same-day brain MRI 12/13/2020. FINDINGS: Mildly motion degraded examination. Anterior circulation: The intracranial internal carotid arteries are patent. The M1 middle cerebral arteries are patent. Atherosclerotic  irregularity of the M2 and more distal middle cerebral artery vessels bilaterally. Most notably, there are sites of severe stenosis within mid M2 left MCA vessels. The anterior cerebral arteries are patent. 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 Posterior circulation: The intracranial vertebral arteries are patent. The basilar artery is patent. The posterior cerebral arteries are patent. P1 PCA segments are hypoplastic bilaterally with superimposed high-grade stenoses. However, there are sizable bilateral posterior communicating arteries which are widely patent. Severe stenosis within the proximal P2 right PCA. Severe right PCA distal branch atherosclerotic irregularity. Anatomic variants: As described IMPRESSION: Intracranial atherosclerotic disease with multifocal stenoses, as outlined and with findings most notably as follows. Sites of severe stenosis within mid M2 left MCA vessels. The P1 posterior cerebral artery segments are hypoplastic with superimposed severe stenoses bilaterally. However, there are sizable bilateral posterior communicating arteries which are widely patent. Severe stenosis within the proximal P2 right PCA. Severe right PCA distal branch atherosclerotic irregularity. 2 mm vascular protrusion arising from the cavernous right ICA,  likely reflecting an aneurysm. Electronically Signed   By: Kellie Simmering D.O.   On: 12/13/2020 14:45   MR ANGIO NECK WO CONTRAST  Result Date: 12/15/2020 CLINICAL DATA:  Acute neurologic deficit EXAM: MRA NECK WITHOUT CONTRAST TECHNIQUE: Angiographic images of the neck were acquired using MRA technique without intravenous contrast. Carotid stenosis measurements (when applicable) are obtained utilizing NASCET criteria, using the distal internal carotid diameter as the denominator. COMPARISON:  No pertinent prior exam. FINDINGS: The images are severely degraded by  motion. Within that limitation, the carotid arteries appear to be patent from the bifurcation to the skull base. The V2 segments of the vertebral arteries also appear to be patent. IMPRESSION: Severely degraded by motion. Within that limitation, there is no visualized occlusion of the carotid or vertebral arteries. Electronically Signed   By: Ulyses Jarred M.D.   On: 12/15/2020 02:32   MR BRAIN WO CONTRAST  Result Date: 12/13/2020 CLINICAL DATA:  Seizure, abnormal neuro exam. EXAM: MRI HEAD WITHOUT CONTRAST TECHNIQUE: Multiplanar, multiecho pulse sequences of the brain and surrounding structures were obtained without intravenous contrast. COMPARISON:  Prior head CT examinations 12/13/2020 and earlier. FINDINGS: Brain: Mild intermittent motion degradation. Mild generalized cerebral and cerebellar atrophy. 8 mm acute infarct within the left parietal white matter (series 6, image 27). Additional suspected punctate acute infarct within the left parietal cortex. Mild multifocal T2/FLAIR hyperintense signal abnormality within the cerebral white matter, nonspecific but compatible with chronic small vessel ischemic disease. Small chronic lacunar infarct within the right thalamus. Small chronic lacunar infarcts within the right cerebellar hemisphere. The hippocampi are symmetric in size and signal. No evidence of an intracranial mass. No chronic intracranial blood products. No extra-axial fluid collection. No midline shift. Vascular: Maintained flow voids within the proximal large arterial vessels. Skull and upper cervical spine: No focal suspicious marrow lesion. Sinuses/Orbits: Left optic nerve atrophy. Right lens replacement. Moderate mucosal thickening within an asymmetrically diminutive left maxillary sinus with associated chronic reactive osteitis. Trace mucosal thickening within the bilateral ethmoid air cells. IMPRESSION: Mildly motion degraded exam. 8 mm acute infarct within the left parietal white matter.  Additional suspected punctate acute infarct within the left parietal cortex. Background mild chronic small-vessel ischemic changes within the cerebral white matter. Small chronic lacunar infarcts within the left thalamus and right cerebellar hemisphere. Mild generalized parenchymal atrophy. Left optic nerve atrophy. Paranasal sinus disease, most notably moderate left maxillary sinusitis. Electronically Signed   By: Kellie Simmering D.O.   On: 12/13/2020 14:37   DG Chest Port 1 View  Result Date: 12/13/2020 CLINICAL DATA:  PICC line EXAM: PORTABLE CHEST 1 VIEW COMPARISON:  December 13, 2020 FINDINGS: The cardiomediastinal silhouette is unchanged enlarged in contour.No PICC line is visualized as per order requisition. There is a RIGHT chest CVC with tip terminating over the RIGHT atrium. Small LEFT pleural effusion. No pneumothorax. No acute pleuroparenchymal abnormality. Visualized abdomen is unremarkable. Degenerative changes of the RIGHT shoulder. IMPRESSION: No PICC line is visualized as per order requisition. There is a RIGHT chest CVC with tip terminating over the RIGHT atrium. Electronically Signed   By: Valentino Saxon M.D.   On: 12/13/2020 16:41   DG Abd Portable 1V  Result Date: 12/14/2020 CLINICAL DATA:  Abdomen pain EXAM: PORTABLE ABDOMEN - 1 VIEW COMPARISON:  None. FINDINGS: Moderate diffuse gaseous distension of the colon measuring up to 8 cm. Frothy appearance in the right colon is presumably due to stool. No definite dilated central small bowel. IMPRESSION: Moderate diffuse gaseous dilatation  of the bowel, possible colon ileus though if distal obstruction is a concern, further evaluation with CT should be considered Electronically Signed   By: Donavan Foil M.D.   On: 12/14/2020 20:58   EEG adult  Result Date: 12/14/2020 Derek Jack, MD     12/14/2020  3:49 PM Routine EEG Report Jamie Benitez is a 75 y.o. female with a history of seizure who is undergoing an EEG to evaluate for  seizures. Report: This EEG was acquired with electrodes placed according to the International 10-20 electrode system (including Fp1, Fp2, F3, F4, C3, C4, P3, P4, O1, O2, T3, T4, T5, T6, A1, A2, Fz, Cz, Pz). The following electrodes were missing or displaced: none. The occipital dominant rhythm was 6 Hz. This activity is reactive to stimulation. Drowsiness was manifested by background fragmentation; deeper stages of sleep were not identified. There was no focal slowing. There were no interictal epileptiform discharges. There were no electrographic seizures identified. There was no abnormal response to photic stimulation. Hyperventilation was not performed. Impression & clinical correlation: This EEG was obtained while awake and drowsy and is abnormal due to moderate diffuse slowing indicative of global cerebral dysfunction. There were no seizures or other epileptiform abnormalities seen during this recording. Su Monks, MD [facility] [phone] If 7pm- 7am, please page neurology on call as listed in [place].        Scheduled Meds:  amiodarone  200 mg Oral BID   calcitRIOL  0.25 mcg Oral Daily   calcium carbonate  1,000 mg of elemental calcium Oral BID WC   chlorhexidine gluconate (MEDLINE KIT)  15 mL Mouth Rinse BID   Chlorhexidine Gluconate Cloth  6 each Topical Daily   clopidogrel  75 mg Oral Daily   heparin injection (subcutaneous)  5,000 Units Subcutaneous Q8H   hydrOXYzine  50 mg Oral Daily   insulin aspart  0-5 Units Subcutaneous QHS   insulin aspart  0-9 Units Subcutaneous TID WC   lidocaine  1 patch Transdermal Q24H   mouth rinse  15 mL Mouth Rinse 10 times per day   midodrine       midodrine  10 mg Oral TID WC   pantoprazole  40 mg Oral Daily   sodium chloride flush  10-40 mL Intracatheter Q12H   traZODone  50 mg Oral QHS   Continuous Infusions:   ceFAZolin (ANCEF) IV 1 g (12/14/20 2303)   dextrose 5 % and 0.9% NaCl 60 mL/hr at 12/15/20 0230   levETIRAcetam 500 mg  (12/15/20 1304)   And   levETIRAcetam       LOS: 5 days    Time spent: 25 minutes    Sidney Ace, MD [facility] Pager [phone]  If 7PM-7AM, please contact night-coverage 12/15/2020, 1:12 PM

## 2020-12-15 NOTE — Evaluation (Signed)
Physical Therapy Re-Evaluation Patient Details Name: Jamie Benitez MRN: [id number] DOB: 1945-10-17 Today's Date: 12/15/2020  History of Present Illness  Pt admitted to [facility] on 12/09/20 for c/o progressive generalized weakness and hypoxia, with sat in 80's at home on RA. Significant PMH includes: sCHF (EF 35%), CAD with stent placement, CKD (3a), cLBP, Bell's Palsy, HTN, HLD, diabetes, GERD, depression, anxiety. Elevated troponin likely due to demand ischemia and not MI. Developed tachyarrhythmia with Afib as well as worsening AKI, hypotension, and was transferred to SD/ICU for pressor support, amiodarone gtt, and nephrology consult. Spine MRI negative for spinal abscess. 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   Clinical Impression  Pt admitted with above diagnosis. 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 Pt overall difficult to assess cognitively and physically as pt initially was mumbling inaudible words to answer simple questions but as time went on pt able to appropriately follow single step commands and answer with one to three word answers to questions. Difficult to assess if pt unwilling to attempt mobility or having difficulty with task as pt was only able to wiggle her toes and squeeze PT/OT hands denying multiple attempts to assess pt's LE/UE mobility. Ultimately pt was a total A of 2 to transfer supine to seated EoB requiring total assist for static sitting despite BUE placement to improve BOS. Pt denying attempts to wipe off face with wash cloth or kicking Le's. Pt does report normal sensation in BLE's however. Total assist to return pt to supine and elevate LUE as evidenced to be swollen. MD made aware of noted calluses on pt's feet as well. Heels off-loaded with pillows. Pt will require STR to maximize potential/independence with functional mobility as prior to hospitalization pt  was independent with ADL's/IADLs and is now dependent with all mobility. Goals updated to reflect current impairments and level of function. Pt currently with functional limitations due to the deficits listed below (see PT Problem List). Pt will benefit from skilled PT to increase their independence and safety with mobility to allow discharge to the venue listed below.     Recommendations for follow up therapy are one component of a multi-disciplinary discharge planning process, led by the attending physician.  Recommendations may be updated based on patient status, additional functional criteria and insurance authorization.  Follow Up Recommendations SNF    Equipment Recommendations  Other (comment) (tbd by next venue of care)    Recommendations for Other Services       Precautions / Restrictions Precautions Precautions: Fall Precaution Comments: R interjugular HD port Restrictions Weight Bearing Restrictions: No Other Position/Activity Restrictions: Restricted to sitting EOB      Mobility  Bed Mobility Overal bed mobility: Needs Assistance Bed Mobility: Supine to Sit     Supine to sit: +2 for physical assistance;Total assist Sit to supine: Total assist;+2 for physical assistance        Transfers Overall transfer level: Needs assistance               General transfer comment: Deferred/unable due to R IJ HD port  Ambulation/Gait                Stairs            Wheelchair Mobility    Modified Rankin (Stroke Patients Only)       Balance Overall balance assessment: Needs assistance Sitting-balance support: Feet supported;Bilateral upper extremity supported  Sitting balance-Leahy Scale: Zero Sitting balance - Comments: Required Total A +1 to maintain static sitting balance. Attempts made for BUE support to assess if pt could maintain sitting balance but unable to maintain without total assist.     Standing balance-Leahy Scale: Zero Standing  balance comment: deferred/unsafe                             Pertinent Vitals/Pain Pain Assessment: Faces Faces Pain Scale: Hurts whole lot Pain Location: back Pain Descriptors / Indicators: Aching;Discomfort;Grimacing;Guarding Pain Intervention(s): Repositioned;Monitored during session    Home Living Family/patient expects to be discharged to:: Private residence Living Arrangements: Other relatives Available Help at Discharge: Family;Available PRN/intermittently Type of Home: House Home Access: Stairs to enter Entrance Stairs-Rails: Doctor, general practice of Steps: 3 Home Layout: One level Home Equipment: Walker - 2 wheels;Cane - single point      Prior Function Level of Independence: Independent         Comments: Pt reports living with grandson who works during the day and "is not very helpful". She reports being independent at baseline with mobility and self care tasks. States she uses RW vs. SPC PRN for ambulation. Ind with medication management. Doesn't drive or wear O2 at baseline.     Hand Dominance   Dominant Hand: Right    Extremity/Trunk Assessment   Upper Extremity Assessment Upper Extremity Assessment: Generalized weakness    Lower Extremity Assessment Lower Extremity Assessment: Generalized weakness;RLE deficits/detail;LLE deficits/detail RLE Sensation: WNL    Cervical / Trunk Assessment Cervical / Trunk Assessment: Normal  Communication   Communication: HOH  Cognition Arousal/Alertness: Awake/alert Behavior During Therapy: WFL for tasks assessed/performed Overall Cognitive Status: Difficult to assess                                 General Comments: Able to follow single step commands, appropriate response to simple questions. Moaning throughout session and mumbling to certain questions asked initially.      General Comments General comments (skin integrity, edema, etc.): calluses on plantar surface of B  feet primarily at first MTP joints    Exercises Other Exercises Other Exercises: Attempts to assess bed mobility, UE/LE mobility/use during functional mobility.   Assessment/Plan    PT Assessment Patient needs continued PT services  PT Problem List Decreased strength;Decreased activity tolerance;Decreased balance;Decreased mobility;Decreased cognition;Decreased safety awareness;Pain;Obesity;Decreased skin integrity;Decreased range of motion       PT Treatment Interventions Gait training;Stair training;Functional mobility training;Therapeutic activities;Therapeutic exercise;Balance training;Neuromuscular re-education;DME instruction;Patient/family education    PT Goals (Current goals can be found in the Care Plan section)  Acute Rehab PT Goals PT Goal Formulation: Patient unable to participate in goal setting    Frequency 7X/week   Barriers to discharge Decreased caregiver support      Co-evaluation PT/OT/SLP Co-Evaluation/Treatment: Yes Reason for Co-Treatment: Complexity of the patient's impairments (multi-system involvement);Necessary to address cognition/behavior during functional activity;For patient/therapist safety;To address functional/ADL transfers PT goals addressed during session: Mobility/safety with mobility         AM-PAC PT "6 Clicks" Mobility  Outcome Measure Help needed turning from your back to your side while in a flat bed without using bedrails?: Total Help needed moving from lying on your back to sitting on the side of a flat bed without using bedrails?: Total Help needed moving to and from a bed to a chair (including a wheelchair)?: Total  Help needed standing up from a chair using your arms (e.g., wheelchair or bedside chair)?: Total Help needed to walk in hospital room?: Total Help needed climbing 3-5 steps with a railing? : Total 6 Click Score: 6    End of Session Equipment Utilized During Treatment: Oxygen Activity Tolerance: Patient limited by  pain;Patient limited by lethargy;Patient limited by fatigue Patient left: in bed;with call bell/phone within reach;with bed alarm set Nurse Communication: Mobility status PT Visit Diagnosis: Unsteadiness on feet (R26.81);Muscle weakness (generalized) (M62.81);History of falling (Z91.81);Pain    Time: 1350-1411 PT Time Calculation (min) (ACUTE ONLY): 21 min   Charges:   PT Evaluation $PT Eval Moderate Complexity: 1 Mod         Laraina Sulton M. Fairly IV, PT, DPT Physical Therapist- [place]  [facility]  12/15/2020, 2:49 PM

## 2020-12-15 NOTE — Consult Note (Signed)
ANTICOAGULATION CONSULT NOTE - Initial Consult  Pharmacy Consult for heparin infusion Indication: stroke  Allergies  Allergen Reactions   Sulfa Antibiotics Rash    Mouth blisters Other reaction(s): Angioedema "whole mouth swells"   Gabapentin Diarrhea    Patient Measurements: Height: 5' (152.4 cm) Weight: 98.3 kg (216 lb 12.8 oz) IBW/kg (Calculated) : 45.5 Heparin Dosing Weight: 69.3 kg   Vital Signs: Temp: 98.2 F (36.8 C) (09/26 2351) Temp Source: Oral (09/26 2351) BP: 128/63 (09/26 2351) Pulse Rate: 80 (09/26 2351)  Labs: Recent Labs    12/12/20 0355 12/12/20 1158 12/13/20 0730 12/14/20 0655  HGB 9.8* 9.8* 10.0* 8.7*  HCT 27.5* 27.1* 28.6* 23.6*  PLT 155 173 155 185  HEPARINUNFRC 0.47 0.41 0.80*  --   CREATININE  --  5.06* 6.07* 6.24*     Estimated Creatinine Clearance: 8.4 mL/min (A) (by C-G formula based on SCr of 6.24 mg/dL (H)).   Medical History: Past Medical History:  Diagnosis Date   Anxiety    Bell's palsy 08/2018   CHF (congestive heart failure) (HCC)    Chronic back pain    Chronic cough    CKD (chronic kidney disease), stage III (HCC)    Depression    Diabetes mellitus, type 2 (HCC)    GERD (gastroesophageal reflux disease)    Hyperlipidemia    Hypertension    Multilevel degenerative disc disease    Myocardial infarction [facility]) 2007   & 2014   Neuropathy    Osteoporosis     Medications:  Previous therapeutic heparin infusion discontinued 9/25 1030    Assessment:  Pt is 75 yo female who originally presented with generalized weakness, and started in heparin infusion for possible nSTEMI/ACS, continued for atrial fibrillation. Neurology following for new onset seizures, concern for CVA. Pharmacy has been consulted to re-initiate heparin for CVA with reduced heparin level goals at this time.  Goal of Therapy:  Heparin level 0.3-0.5 units/ml Monitor platelets by anticoagulation protocol: Yes   Plan:  Start heparin infusion at 1300  units/hr (previously within goal range of 0.3-0.5 units/mL)  Check anti-Xa level in 8 hours and daily while on heparin Continue to monitor H&H and platelets  RN states pt will be in MRI for ~ 1/2 hr to 1 hr so heparin gtt will be paused during that time.   Scherrie Gerlach, PharmD Clinical Pharmacist   12/15/2020,1:17 AM

## 2020-12-15 NOTE — Progress Notes (Signed)
Neurology Progress Note  Prior HPI by Dr. Otelia Limes: "Jamie Benitez is an 75 y.o. female with CAD, CHF, CKD 3, DM2, HLD, HTN, chronic cough, neuropathy, osteoporosis and history of Bell's palsy, who presented to the hospital on 9/21 with fatigue and diffuse weakness after having had diarrhea for the past several days prior to admission. She was found to have acutely worsened renal function on her CKD3, and was D-dimer positive with elevated troponin and EKG changes, but overall pattern did not militate in favor of STEMI or NSTEMI per cardiology. She was felt possibly to have decompensation of her CHF as well, but could not be given diuretics due to her abnormal renal function. During her stay her renal function continued to worsen and Nephrology was consulted. She also had new onset of atrial fibrillation with RVR and was started on amiodarone. She has had persistent hypotension during this admission [improved with midodrine]   This morning, she had a spell witnessed by her RN that appeared most consistent with a new onset seizure. The description of the event is as follows: "At approximately 0615, phlebotomist approached pt's primary RN & informed her that the pt was "drooling"; and was that normal? Upon entering pt's room, Pt was noted to have foam around her mouth, eyes rolled up in her head. VS obtained, 130/65, HR 80 (remains in a-fib), SpO2 98% on 2L [place], CBG 136. Pt not responsive to painful stimuli, but would open eyes and not focus on anything in the room in particular. Rapid response called by other RN's, ICU CN and AC arrived and were informed of situation. On call provider messaged, orders received..""  Subjective/Interval events Started dialysis 9/26 Abdominal tenderness and pain overnight, now resolved Reports no headache, stable chronic back pain, no chest pain   Relevant Data   Basic Metabolic Panel: Recent Labs  Lab 12/09/20 0415 12/10/20 0721 12/11/20 1815 12/12/20 1158  12/13/20 0730 12/14/20 0655 12/15/20 0620  NA 134*   < > 128* 128* 128* 131* 133*  K 3.4*   < > 3.5 2.8* 3.7 3.7 3.3*  CL 100   < > 93* 82* 86* 90* 92*  CO2 22   < > 20* 27 19* 26 27  GLUCOSE 238*   < > 97 105* 143* 121* 127*  BUN 35*   < > 70* 75* 80* 89* 66*  CREATININE 2.02*   < > 5.28* 5.06* 6.07* 6.24* 5.41*  CALCIUM 8.5*   < > 6.4* 6.0* 6.3* 6.5* 7.0*  MG 1.8  --   --   --   --   --   --   PHOS  --   --   --   --   --   --  4.9*   < > = values in this interval not displayed.    CBC: Recent Labs  Lab 12/09/20 0415 12/10/20 0721 12/12/20 0355 12/12/20 1158 12/13/20 0730 12/14/20 0655 12/15/20 0620  WBC 11.4*   < > 16.0* 16.0* 17.6* 12.4* 10.5  NEUTROABS 9.1*  --   --  14.1*  --   --   --   HGB 12.3   < > 9.8* 9.8* 10.0* 8.7* 8.4*  HCT 37.6   < > 27.5* 27.1* 28.6* 23.6* 22.6*  MCV 86.0   < > 78.8* 77.4* 79.9* 74.4* 74.8*  PLT 190   < > 155 173 155 185 193   < > = values in this interval not displayed.    Lab Results  Component Value Date   HGBA1C 5.8 (H) 12/09/2020   Lab Results  Component Value Date   CHOL 94 12/10/2020   HDL 33 (L) 12/10/2020   LDLCALC 37 12/10/2020   TRIG 120 12/10/2020   CHOLHDL 2.8 12/10/2020     MRI brain wo contrast Mildly motion degraded exam. 8 mm acute infarct within the left parietal white matter. Additional suspected punctate acute infarct within the left parietal cortex. Background mild chronic small-vessel ischemic changes within the cerebral white matter. Small chronic lacunar infarcts within the left thalamus and right cerebellar hemisphere. Mild generalized parenchymal atrophy.  MRA head  Intracranial atherosclerotic disease with multifocal stenoses, as outlined and with findings most notably as follows.   Sites of severe stenosis within mid M2 left MCA vessels.   The P1 posterior cerebral artery segments are hypoplastic with superimposed severe stenoses bilaterally. However, there are sizable bilateral posterior  communicating arteries which are widely patent. Severe stenosis within the proximal P2 right PCA. Severe right PCA distal branch atherosclerotic irregularity.   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  MRA neck  Severely degraded by motion. Within that limitation, there is no visualized occlusion of the carotid or vertebral arteries.  All CNS imaging personally reviewed: I agree with the above interpretations  KUB 2/27 2 AM Moderate diffuse gaseous dilatation of the bowel, possible colonileus though if distal obstruction is a concern, further evaluation with CT should be considered  TTE  1. Left ventricular ejection fraction, by estimation, is 25 to 30%. The left ventricle has severely decreased function. The left ventricle demonstrates regional wall motion abnormalities (see scoring diagram/findings for description). The left ventricular internal cavity size was mildly dilated. There is moderate left ventricular hypertrophy. Left ventricular diastolic parameters are  indeterminate. There is akinesis of the left ventricular, entire inferior wall and inferolateral wall.   2. Right ventricular systolic function is normal. The right ventricular size is normal. Tricuspid regurgitation signal is inadequate for assessing PA pressure.   3. Left atrial size was moderately dilated.   4. Right atrial size was mildly dilated.   5. The mitral valve is normal in structure. Moderate mitral valve regurgitation. No evidence of mitral stenosis.   6. The aortic valve is normal in structure. Aortic valve regurgitation is not visualized. Mild to moderate aortic valve sclerosis/calcification is present, without any evidence of aortic stenosis.  Conclusion(s)/Recommendation(s): No evidence of valvular vegetations on this transthoracic echocardiogram.   TEE pending GoC / clincial stability   Carotid ultrasound pending  EEG 9/26 was obtained while awake and  drowsy and is abnormal due to moderate diffuse slowing indicative of global cerebral dysfunction. There were no seizures or other epileptiform abnormalities seen during this recording.  O:  Vitals:   12/15/20 0355 12/15/20 0753  BP: (!) 119/53 115/78  Pulse: 76 86  Resp: 16 18  Temp: 98.8 F (37.1 C) 97.9 F (36.6 C)  SpO2: 98% 98%   General: Moaning continuously while on dialysis but reporting no acute pain\ Cardiac: Irregularly irregular rhythm, perfusing extremities well Abdomen: Obese/distended but soft and nontender Extremities: Edematous with bruising at various IV sites  Examination  Mental Status: Mildly drowsy, with decreased level of alertness. Speech is fluent with naming appropriate for simple objects.  Repetition intact. Able to follow all simple commands but not multistep.  Reports it is October 2023 Cranial Nerves: II: OD: Visual fields intact with no extinction to DSS.  OS: Blind PERRL, except  for a left APD.  III,IV, VI: Eyelids are slightly drooping symmetrically. Eyes are dysconjugate with decreased motility OS. Has hesitation when tracking to left and right with her right eye and could not fully bury sclerae at extremes of gaze, stable.   V: Temp sensation equal bilaterally VII: Smile slightly asymmetric in the context of her history of Bell's palsy (decreased NL fold on the right) on prior examination, on my examination today no clear droop but poor effort VIII: Intact to voice IX,X: No hypophonia XI: Head is midline. XII: Midline tongue extension Motor: RUE:  Shoulder pain due to old rotator cuff injury limits shoulder movement. Able to lift and push with forearm with 3/5 strength. Grip 4-/5 LUE: With support and maintains arm up for about 1 second before it drifts down to the bed. Able to lift with forearm with 3/5 strength. Grip 4-/5 RLE: Cannot lift off bed with hip flexion. Can internally and externally rotate leg. Does not allow examiner to lift leg to  test knee extension due to "pain all over" when passively moved. ADF 3/5.  LLE: Cannot lift off bed with hip flexion. Can internally and externally rotate leg. Does not allow examiner to lift leg to test knee extension due to "pain all over" when passively moved. ADF 3/5. Sensory: Intact to light touch in all 4 extremities without extinction to double simultaneous stimulus Deep Tendon Reflexes: 2+ bilateral biceps.  Unable to elicit in bilateral patellae.  Cerebellar: Unable to assess due to inability to lift arms or legs off bed.  Gait: Unable to assess    Assessment: 75 year old female presenting with diffuse weakness in the setting of multiple comorbidities including hypokalemia, CHF and AKI on CKD as well as MSSA bacteremia. She experienced a spell c/f seizure 9/25 and Neurology was consulted. She has no prior history of seizures. Head CT shows normal appearance of the brain for age.  . 1. Exam generally stable since Dr. Shelbie Hutching prior exam  2. Etiology for her seizure is being worked up. Could have been triggered by her acute ischemic stroke combined hypocalcemia and mild hyponatremia.  She was briefly on phenytoin while awaiting dialysis but now I will start Keppra 3. Has been hypotensive this admission, but BP levels this AM around the time of her seizure were normal, therefore a syncopal convulsion is felt to be unlikely.  4. Renal failure, started dialysis 9/26 5. Regarding her weakness, it is most likely multifactorial, with pain, hypokalemia, AKI and diffuse fatigue in the setting of CHF with EF of 25 to 30% and severely decreased LV function. Reflexes are of decreased amplitudes, but present. Low on the DDx but possible would be MG; ordered MG panel. Doubt AIDP given presence of reflexes several days since presentation and more likely explanation of multifactorial etiology.  Given proximal greater than distal weakness with statin being held for concern for myopathy, will additionally check  CK 6. MSSA bacteremia on cefazolin  7.  Initially on IV heparin drip for concern for ACS, subsequently discontinued and then restarted 9/26 evening   Recommendations:  # Seizure, encephalopathy > EEG without evidence of seizure activity - Now that patient has started dialysis, will start Keppra 500 mg daily with an additional 250 mg to be given postdialysis.  Room to increase to up to 1000 mg daily if patient has breakthrough seizures - Inpatient seizure precautions.  - Ativan 2 mg IV PRN seizure and call Neurology - No driving for 6 mos after last seizure  according to [place] law. Do not bathe alone, climb ladders, operate heavy machinery or do any other activity that could be dangerous if you had a seizure while doing it, full seizure precuations to be included in discharge instructions as below  - Consider discontinuing trazodone, (chronic medications since at least 09/2017)  - Consider discontinuing hydroxyzine (per chart review started in 09/2017 for itching secondary to her psoriasis, consider topical agent instead),  - Agree with holding tramadol, on discharge would replace with a medication that reduces seizure threshold less - May consider weaning antiseizure medications on an outpatient basis but would continue at this dose for now  # Stroke w/u > Etiology likely cardioembolic (A. fib versus endocarditis), although she also has culprit stenoses within the MCA so atheroembolic is also possibility.  TTE has been negative for vegetations.  TEE would be more sensitive to rule out endocarditis but will clarify whether this is within patient's goals of care.  At this time given lack of hemorrhagic conversion even on heparin drip - TEE to more definitively rule out endocarditis pending clinical stability/goals of care - Carotid ultrasound given non-diagnostic MRA neck  # Generalized weakness  - Atorvastatin d/c'd for now given weakness which will be further worked [employer] the outpatient setting (and  LDL currently 33) - Please note in d/c summary that myasthenia gravis panel is pending and should be followed up by PCP as outpatient - CK, aldolase, LFTs ordered, pending  Will f/u outstanding studies.  Appreciate primary team's management of her comorbidities and ID and nephrology following along  Standard seizure precautions: Per [facility] statutes, patients with seizures are not allowed to drive until  they have been seizure-free for six months. Use caution when using heavy equipment or power tools. Avoid working on ladders or at heights. Take showers instead of baths. Ensure the water temperature is not too high on the home water heater. Do not go swimming alone. When caring for infants or small children, sit down when holding, feeding, or changing them to minimize risk of injury to the child in the event you have a seizure.  To reduce risk of seizures, maintain good sleep hygiene avoid alcohol and illicit drug use, take all anti-seizure medications as prescribed.   Brooke Dare MD-PhD [facility] [phone]  [facility] coverage for [facility] is from 8 AM to 4 AM in-house and 4 PM to 8 PM by telephone/video. 8 PM to 8 AM emergent questions or overnight urgent questions should be addressed to Teleneurology On-call or [facility] neurohospitalist; contact information can be found on AMION  Greater than 35 minutes were spent in the care of this patient today of which over 50% was at bedside.  Dr. Beverly Gust updated verbally with full recommendations communicated via secure chat as well.

## 2020-12-15 NOTE — Progress Notes (Signed)
ID Pt  is drowsy Grandson at bedside On calling her she says she is okay   Patient Vitals for the past 24 hrs:  BP Temp Temp src Pulse Resp SpO2  12/15/20 2007 129/66 98.6 F (37 C) -- (!) 110 18 97 %  12/15/20 1200 (!) 126/107 -- -- -- 18 --  12/15/20 1136 -- -- -- -- 17 --  12/15/20 1131 -- -- -- 79 20 99 %  12/15/20 1130 (!) 119/96 -- -- 98 18 95 %  12/15/20 1115 130/64 -- -- 96 18 97 %  12/15/20 1100 (!) 113/58 -- -- 84 17 97 %  12/15/20 1045 126/75 -- -- 85 15 96 %  12/15/20 1030 121/62 -- -- 82 17 94 %  12/15/20 1015 (!) 126/96 -- -- 86 17 96 %  12/15/20 1000 117/89 -- -- 87 19 97 %  12/15/20 0945 (!) 142/83 -- -- 83 19 97 %  12/15/20 0930 124/64 -- -- 85 17 95 %  12/15/20 0915 (!) 134/98 -- -- 84 19 100 %  12/15/20 0905 -- -- -- 69 15 99 %  12/15/20 0900 (!) 117/57 -- -- 79 13 100 %  12/15/20 0850 112/63 98.1 F (36.7 C) Oral 84 14 100 %  12/15/20 0845 -- -- -- 73 14 100 %  12/15/20 0830 (!) 122/54 -- -- 77 15 100 %  12/15/20 0815 -- -- -- 79 14 100 %  12/15/20 0753 115/78 97.9 F (36.6 C) -- 86 18 98 %  12/15/20 0355 (!) 119/53 98.8 F (37.1 C) Oral 76 16 98 %  12/14/20 2351 128/63 98.2 F (36.8 C) Oral 80 16 96 %   O/e somnolent Chronically ill Pale Chest b/l air entry- crepts bases HS irregular Abd soft- minimal tenderness to deep palpation Rt Ij HD  LAbs  CBC Latest Ref Rng & Units 12/15/2020 12/14/2020 12/13/2020  WBC 4.0 - 10.5 K/uL 10.5 12.4(H) 17.6(H)  Hemoglobin 12.0 - 15.0 g/dL 7.8(G) 9.5(A) 10.0(L)  Hematocrit 36.0 - 46.0 % 22.6(L) 23.6(L) 28.6(L)  Platelets 150 - 400 K/uL 193 185 155      CMP Latest Ref Rng & Units 12/15/2020 12/14/2020 12/13/2020  Glucose 70 - 99 mg/dL 213(Y) 865(H) 846(N)  BUN 8 - 23 mg/dL 62(X) 52(W) 41(L)  Creatinine 0.44 - 1.00 mg/dL 2.44(W) 1.02(V) 2.53(G)  Sodium 135 - 145 mmol/L 133(L) 131(L) 128(L)  Potassium 3.5 - 5.1 mmol/L 3.3(L) 3.7 3.7  Chloride 98 - 111 mmol/L 92(L) 90(L) 86(L)  CO2 22 - 32 mmol/L 27 26 19(L)   Calcium 8.9 - 10.3 mg/dL 7.0(L) 6.5(L) 6.3(LL)  Total Protein 6.5 - 8.1 g/dL 4.8(L) - -  Total Bilirubin 0.3 - 1.2 mg/dL 0.9 - -  Alkaline Phos 38 - 126 U/L 115 - -  AST 15 - 41 U/L 34 - -  ALT 0 - 44 U/L <5 - -    BC 12/09/20 MSSA 12/10/20 BC- NG   MRI/MRA 8 mm acute infarct within the left parietal white matter.  Additional suspected punctate acute infarct within the left parietal cortex.  Background mild chronic small-vessel ischemic changes within the cerebral white matter.  Small chronic lacunar infarcts within the left thalamus and right cerebellar hemisphere.  Impression/recommendation MSSA bacteremia.  Possible source could be skin lesions but unclear On cefazolin Repeat blood culture has been sent 2D echo does not show any vegetation on the valve.  EF is 25 to 30%. MRI of the lumbar spine does not show any infection. Patient  will need TEE. But not stable to have the procedure  Seizure - MRI showed 8 mm acute infarct within the left parietal white matter.  Sites of severe stenosis within mid M2 left MCA vessels. Encephalopathy- metabolic likely due to AKI AKI.  Worsening.  Combination of sepsis and hypotension. Started HD today  A. fib .  On amiodarone  Demand ischemia  History of CAD with stent.  Patient on Plavix and atorvastatin.  Ischemic cardiomyopathy.  EF is 25 to 30%   Discussed the management with her grandson

## 2020-12-15 NOTE — Progress Notes (Signed)
RN take patient to the MRI. Consult with Pharmacy, Ok to pause D5NS and Hep gtt for short time. Both infusion paused at 0130 and restart at 0230. Will keep monitoring.

## 2020-12-15 NOTE — Progress Notes (Signed)
PT Cancellation Note  Patient Details Name: Jamie Benitez MRN: [id number] DOB: 1945-04-30   Cancelled Treatment:    Reason Eval/Treat Not Completed: Patient at procedure or test/unavailable. Pt off floor for HD. Will re-attempt as available at later time/date.   Delphia Grates. Fairly IV, PT, DPT Physical Therapist- [place]  [facility]  12/15/2020, 10:41 AM

## 2020-12-15 NOTE — Progress Notes (Signed)
[facility]  PROGRESS NOTE   Subjective:   Patient seen and evaluated during dialysis   HEMODIALYSIS FLOWSHEET:  Blood Flow Rate (mL/min): 250 mL/min Arterial Pressure (mmHg): -80 mmHg Venous Pressure (mmHg): 80 mmHg Transmembrane Pressure (mmHg): 60 mmHg Ultrafiltration Rate (mL/min): 400 mL/min Dialysate Flow Rate (mL/min): 500 ml/min Conductivity: Machine : 13.9 Conductivity: Machine : 13.9  Denies pain or discomfort Moaning and yelling out during treatment  Objective:  Vital signs in last 24 hours:  Temp:  [97.9 F (36.6 C)-98.8 F (37.1 C)] 98.1 F (36.7 C) (09/27 0850) Pulse Rate:  [69-99] 96 (09/27 1115) Resp:  [13-20] 18 (09/27 1115) BP: (112-142)/(53-98) 130/64 (09/27 1115) SpO2:  [94 %-100 %] 97 % (09/27 1115)  Weight change:  Filed Weights   12/09/20 0354 12/13/20 0503  Weight: 102.1 kg 98.3 kg    Intake/Output: I/O last 3 completed shifts: In: 1062.3 [I.V.:1012.3; IV Piggyback:50] Out: 145 [Urine:145]   Intake/Output this shift:  No intake/output data recorded.  Physical Exam: General:  No acute distress  Head:  Normocephalic, atraumatic. Moist oral mucosal membranes  Eyes:  Anicteric  Lungs:   Clear to auscultation, normal effort  Heart:  S1S2 no rubs  Abdomen:   Soft, nontender, bowel sounds present  Extremities:  no peripheral edema.  Neurologic:  Awake, alert, following commands  Skin:  No lesions  Access: Rt IJ temp cath    Basic Metabolic Panel: Recent Labs  Lab 12/09/20 0415 12/10/20 0721 12/11/20 1815 12/12/20 1158 12/13/20 0730 12/14/20 0655 12/15/20 0620  NA 134*   < > 128* 128* 128* 131* 133*  K 3.4*   < > 3.5 2.8* 3.7 3.7 3.3*  CL 100   < > 93* 82* 86* 90* 92*  CO2 22   < > 20* 27 19* 26 27  GLUCOSE 238*   < > 97 105* 143* 121* 127*  BUN 35*   < > 70* 75* 80* 89* 66*  CREATININE 2.02*   < > 5.28* 5.06* 6.07* 6.24* 5.41*  CALCIUM 8.5*   < > 6.4* 6.0* 6.3* 6.5* 7.0*  MG 1.8  --   --   --   --   --   --    PHOS  --   --   --   --   --   --  4.9*   < > = values in this interval not displayed.     CBC: Recent Labs  Lab 12/09/20 0415 12/10/20 0721 12/12/20 0355 12/12/20 1158 12/13/20 0730 12/14/20 0655 12/15/20 0620  WBC 11.4*   < > 16.0* 16.0* 17.6* 12.4* 10.5  NEUTROABS 9.1*  --   --  14.1*  --   --   --   HGB 12.3   < > 9.8* 9.8* 10.0* 8.7* 8.4*  HCT 37.6   < > 27.5* 27.1* 28.6* 23.6* 22.6*  MCV 86.0   < > 78.8* 77.4* 79.9* 74.4* 74.8*  PLT 190   < > 155 173 155 185 193   < > = values in this interval not displayed.      Urinalysis: No results for input(s): COLORURINE, LABSPEC, PHURINE, GLUCOSEU, HGBUR, BILIRUBINUR, KETONESUR, PROTEINUR, UROBILINOGEN, NITRITE, LEUKOCYTESUR in the last 72 hours.  Invalid input(s): APPERANCEUR    Imaging: MR ANGIO HEAD WO CONTRAST  Result Date: 12/13/2020 CLINICAL DATA:  Seizure, abnormal neuro exam. EXAM: MRA HEAD WITHOUT CONTRAST TECHNIQUE: Angiographic images of the Circle of Willis were acquired using MRA technique without intravenous contrast. COMPARISON:  Same-day  brain MRI 12/13/2020. FINDINGS: Mildly motion degraded examination. Anterior circulation: The intracranial internal carotid arteries are patent. The M1 middle cerebral arteries are patent. Atherosclerotic irregularity of the M2 and more distal middle cerebral artery vessels bilaterally. Most notably, there are sites of severe stenosis within mid M2 left MCA vessels. The anterior cerebral arteries are patent. 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 Posterior circulation: The intracranial vertebral arteries are patent. The basilar artery is patent. The posterior cerebral arteries are patent. P1 PCA segments are hypoplastic bilaterally with superimposed high-grade stenoses. However, there are sizable bilateral posterior communicating arteries which are widely patent. Severe stenosis  within the proximal P2 right PCA. Severe right PCA distal branch atherosclerotic irregularity. Anatomic variants: As described IMPRESSION: Intracranial atherosclerotic disease with multifocal stenoses, as outlined and with findings most notably as follows. Sites of severe stenosis within mid M2 left MCA vessels. The P1 posterior cerebral artery segments are hypoplastic with superimposed severe stenoses bilaterally. However, there are sizable bilateral posterior communicating arteries which are widely patent. Severe stenosis within the proximal P2 right PCA. Severe right PCA distal branch atherosclerotic irregularity. 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 Electronically Signed   By: Kyle  Golden D.O.   On: 12/13/2020 14:45   MR ANGIO NECK WO CONTRAST  Result Date: 12/15/2020 CLINICAL DATA:  Acute neurologic deficit EXAM: MRA NECK WITHOUT CONTRAST TECHNIQUE: Angiographic images of the neck were acquired using MRA technique without intravenous contrast. Carotid stenosis measurements (when applicable) are obtained utilizing NASCET criteria, using the distal internal carotid diameter as the denominator. COMPARISON:  No pertinent prior exam. FINDINGS: The images are severely degraded by motion. Within that limitation, the carotid arteries appear to be patent from the bifurcation to the skull base. The V2 segments of the vertebral arteries also appear to be patent. IMPRESSION: Severely degraded by motion. Within that limitation, there is no visualized occlusion of the carotid or vertebral arteries. Electronically Signed   By: Kevin  Herman M.D.   On: 12/15/2020 02:32   MR BRAIN WO CONTRAST  Result Date: 12/13/2020 CLINICAL DATA:  Seizure, abnormal neuro exam. EXAM: MRI HEAD WITHOUT CONTRAST TECHNIQUE: Multiplanar, multiecho pulse sequences of the brain and surrounding structures were obtained without intravenous contrast. COMPARISON:  Prior head CT  examinations 12/13/2020 and earlier. FINDINGS: Brain: Mild intermittent motion degradation. Mild generalized cerebral and cerebellar atrophy. 8 mm acute infarct within the left parietal white matter (series 6, image 27). Additional suspected punctate acute infarct within the left parietal cortex. Mild multifocal T2/FLAIR hyperintense signal abnormality within the cerebral white matter, nonspecific but compatible with chronic small vessel ischemic disease. Small chronic lacunar infarct within the right thalamus. Small chronic lacunar infarcts within the right cerebellar hemisphere. The hippocampi are symmetric in size and signal. No evidence of an intracranial mass. No chronic intracranial blood products. No extra-axial fluid collection. No midline shift. Vascular: Maintained flow voids within the proximal large arterial vessels. Skull and upper cervical spine: No focal suspicious marrow lesion. Sinuses/Orbits: Left optic nerve atrophy. Right lens replacement. Moderate mucosal thickening within an asymmetrically diminutive left maxillary sinus with associated chronic reactive osteitis. Trace mucosal thickening within the bilateral ethmoid air cells. IMPRESSION: Mildly motion degraded exam. 8 mm acute infarct within the left parietal white matter. Additional suspected punctate acute infarct within the left parietal cortex. Background mild chronic small-vessel ischemic changes within the cerebral white matter. Small chronic   lacunar infarcts within the left thalamus and right cerebellar hemisphere. Mild generalized parenchymal atrophy. Left optic nerve atrophy. Paranasal sinus disease, most notably moderate left maxillary sinusitis. Electronically Signed   By: Kyle  Golden D.O.   On: 12/13/2020 14:37   DG Chest Port 1 View  Result Date: 12/13/2020 CLINICAL DATA:  PICC line EXAM: PORTABLE CHEST 1 VIEW COMPARISON:  December 13, 2020 FINDINGS: The cardiomediastinal silhouette is unchanged enlarged in contour.No PICC  line is visualized as per order requisition. There is a RIGHT chest CVC with tip terminating over the RIGHT atrium. Small LEFT pleural effusion. No pneumothorax. No acute pleuroparenchymal abnormality. Visualized abdomen is unremarkable. Degenerative changes of the RIGHT shoulder. IMPRESSION: No PICC line is visualized as per order requisition. There is a RIGHT chest CVC with tip terminating over the RIGHT atrium. Electronically Signed   By: Stephanie  Peacock M.D.   On: 12/13/2020 16:41   DG Abd Portable 1V  Result Date: 12/14/2020 CLINICAL DATA:  Abdomen pain EXAM: PORTABLE ABDOMEN - 1 VIEW COMPARISON:  None. FINDINGS: Moderate diffuse gaseous distension of the colon measuring up to 8 cm. Frothy appearance in the right colon is presumably due to stool. No definite dilated central small bowel. IMPRESSION: Moderate diffuse gaseous dilatation of the bowel, possible colon ileus though if distal obstruction is a concern, further evaluation with CT should be considered Electronically Signed   By: Kim  Fujinaga M.D.   On: 12/14/2020 20:58   EEG adult  Result Date: 12/14/2020 Stack, Colleen M, MD     12/14/2020  3:49 PM Routine EEG Report Carlisa M Ligas is a 75 y.o. female with a history of seizure who is undergoing an EEG to evaluate for seizures. Report: This EEG was acquired with electrodes placed according to the International 10-20 electrode system (including Fp1, Fp2, F3, F4, C3, C4, P3, P4, O1, O2, T3, T4, T5, T6, A1, A2, Fz, Cz, Pz). The following electrodes were missing or displaced: none. The occipital dominant rhythm was 6 Hz. This activity is reactive to stimulation. Drowsiness was manifested by background fragmentation; deeper stages of sleep were not identified. There was no focal slowing. There were no interictal epileptiform discharges. There were no electrographic seizures identified. There was no abnormal response to photic stimulation. Hyperventilation was not performed. Impression & clinical  correlation: This EEG was obtained while awake and drowsy and is abnormal due to moderate diffuse slowing indicative of global cerebral dysfunction. There were no seizures or other epileptiform abnormalities seen during this recording. Colleen Stack, MD [facility] [phone] If 7pm- 7am, please page neurology on call as listed in AMION.     Medications:    sodium chloride     sodium chloride      ceFAZolin (ANCEF) IV 1 g (12/14/20 2303)   dextrose 5 % and 0.9% NaCl 60 mL/hr at 12/15/20 0230   levETIRAcetam     And   levETIRAcetam      acetaminophen       amiodarone  200 mg Oral BID   calcitRIOL  0.25 mcg Oral Daily   calcium carbonate  1,000 mg of elemental calcium Oral BID WC   chlorhexidine gluconate (MEDLINE KIT)  15 mL Mouth Rinse BID   Chlorhexidine Gluconate Cloth  6 each Topical Daily   clopidogrel  75 mg Oral Daily   hydrOXYzine  50 mg Oral Daily   insulin aspart  0-5 Units Subcutaneous QHS   insulin aspart  0-9 Units Subcutaneous TID WC   lidocaine  1   patch Transdermal Q24H   mouth rinse  15 mL Mouth Rinse 10 times per day   midodrine       midodrine  10 mg Oral TID WC   pantoprazole  40 mg Oral Daily   sodium chloride flush  10-40 mL Intracatheter Q12H   traZODone  50 mg Oral QHS    Assessment/ Plan:     Principal Problem:   NSTEMI (non-ST elevated myocardial infarction) (HCC) Active Problems:   Chronic systolic CHF (congestive heart failure) (HCC)   Chronic back pain   Acute renal failure (HCC)   Depression with anxiety   Type II diabetes mellitus with renal manifestations (HCC)   CAD (coronary artery disease)   HLD (hyperlipidemia)   HTN (hypertension)   Hypokalemia   Leukocytosis   Acute decompensated heart failure (HCC)   Pressure injury of skin   Generalized weakness   DNR (do not resuscitate)   Palliative care by specialist  75-year-old white female with a history of hypertension, coronary artery disease, congestive heart failure,  diabetes, hyperlipidemia, chronic kidney disease now admitted with history of generalized weakness.  She is found to have Staphylococcus sepsis.  She also has acute kidney injury on the top of chronic kidney disease.   #1: Acute kidney injury: Patient with acute kidney injury on the top of chronic kidney disease.  She has CKD stage IIIb with a GFR of 44 cc/min previously. On admission, creatinine  2.02 with eGFR 26. Spoke to the patient's daughter over the telephone and she is agreeable to initiate dialysis.  Dialysis believed to be temporary due to AKI. Receiving second treatment today, tolerated well. Will receive third treatment tomorrow.    #2: Hypokalemia:  likely secondary to bicarbonate infusion.  Potassium 3.3 today.  #3: Metabolic acidosis: Resolved.    #4: Dehydration/hyponatremia: Sodium improved to 133. This will correct with dialysis   #5: Anemia: likely due to chronic kidney disease.  Hgb 8.4 Continue iron supplementation and will consider EPO with dialysis treatments  #6: Seizure activity: Etiology is unclear.  Patient is being followed by neurology.  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 Also 2mm vascular protrusion from right ICA.   #7: Hypocalcemia: calcitriol and calcium gluconate IV given yesterday. Calcium carbonate twice daily    LOS: 5 Shantelle Breeze [facility] 9/27/202211:32 AM   

## 2020-12-15 NOTE — Consult Note (Signed)
ANTICOAGULATION CONSULT NOTE - Initial Consult  Pharmacy Consult for heparin infusion Indication: stroke  Allergies  Allergen Reactions   Sulfa Antibiotics Rash    Mouth blisters Other reaction(s): Angioedema "whole mouth swells"   Gabapentin Diarrhea    Patient Measurements: Height: 5' (152.4 cm) Weight: 98.3 kg (216 lb 12.8 oz) IBW/kg (Calculated) : 45.5 Heparin Dosing Weight: 69.3 kg   Vital Signs: Temp: 98.8 F (37.1 C) (09/27 0355) Temp Source: Oral (09/27 0355) BP: 119/53 (09/27 0355) Pulse Rate: 76 (09/27 0355)  Labs: Recent Labs    12/12/20 1158 12/13/20 0730 12/14/20 0655 12/15/20 0620  HGB 9.8* 10.0* 8.7* 8.4*  HCT 27.1* 28.6* 23.6* 22.6*  PLT 173 155 185 193  HEPARINUNFRC 0.41 0.80*  --  0.32  CREATININE 5.06* 6.07* 6.24* 5.41*     Estimated Creatinine Clearance: 9.7 mL/min (A) (by C-G formula based on SCr of 5.41 mg/dL (H)).   Medical History: Past Medical History:  Diagnosis Date   Anxiety    Bell's palsy 08/2018   CHF (congestive heart failure) (HCC)    Chronic back pain    Chronic cough    CKD (chronic kidney disease), stage III (HCC)    Depression    Diabetes mellitus, type 2 (HCC)    GERD (gastroesophageal reflux disease)    Hyperlipidemia    Hypertension    Multilevel degenerative disc disease    Myocardial infarction [facility]) 2007   & 2014   Neuropathy    Osteoporosis     Medications:  Previous therapeutic heparin infusion discontinued 9/25 1030    Assessment:  Pt is 75 yo female who originally presented with generalized weakness, and started in heparin infusion for possible nSTEMI/ACS, continued for atrial fibrillation. Neurology following for new onset seizures, concern for CVA. Pharmacy has been consulted to re-initiate heparin for CVA with reduced heparin level goals at this time.  Goal of Therapy:  Heparin level 0.3-0.5 units/ml Monitor platelets by anticoagulation protocol: Yes   Plan:  Start heparin infusion at 1300  units/hr (previously within goal range of 0.3-0.5 units/mL)  Check anti-Xa level in 8 hours and daily while on heparin Continue to monitor H&H and platelets  RN states pt will be in MRI for ~ 1/2 hr to 1 hr so heparin gtt will be paused during that time.   9/27:  HL @ 0620 = 0.32 Will continue pt on current rate and draw confirmation level in 8 hrs @ 1400.   Scherrie Gerlach, PharmD Clinical Pharmacist   12/15/2020,7:12 AM

## 2020-12-15 NOTE — Progress Notes (Signed)
Patient completed dialysis treatment as ordered. removed as ordered. Patient tolerated well. Report given to floor nurse Renette Butters, RN.

## 2020-12-16 DIAGNOSIS — N179 Acute kidney failure, unspecified: Secondary | ICD-10-CM | POA: Diagnosis not present

## 2020-12-16 DIAGNOSIS — B9561 Methicillin susceptible Staphylococcus aureus infection as the cause of diseases classified elsewhere: Secondary | ICD-10-CM | POA: Diagnosis not present

## 2020-12-16 DIAGNOSIS — I214 Non-ST elevation (NSTEMI) myocardial infarction: Secondary | ICD-10-CM | POA: Diagnosis not present

## 2020-12-16 DIAGNOSIS — R7881 Bacteremia: Secondary | ICD-10-CM | POA: Diagnosis not present

## 2020-12-16 LAB — BASIC METABOLIC PANEL
Anion gap: 13 (ref 5–15)
BUN: 56 mg/dL — ABNORMAL HIGH (ref 8–23)
CO2: 22 mmol/L (ref 22–32)
Calcium: 7.2 mg/dL — ABNORMAL LOW (ref 8.9–10.3)
Chloride: 98 mmol/L (ref 98–111)
Creatinine, Ser: 4.39 mg/dL — ABNORMAL HIGH (ref 0.44–1.00)
GFR, Estimated: 10 mL/min — ABNORMAL LOW (ref >60)
Glucose, Bld: 118 mg/dL — ABNORMAL HIGH (ref 70–99)
Potassium: 4.1 mmol/L (ref 3.5–5.1)
Sodium: 133 mmol/L — ABNORMAL LOW (ref 135–145)

## 2020-12-16 LAB — PHENYTOIN LEVEL, FREE AND TOTAL
Phenytoin, Free: 2 ug/mL (ref 1.0–2.0)
Phenytoin, Total: 8.6 ug/mL — ABNORMAL LOW (ref 10.0–20.0)

## 2020-12-16 LAB — ACETYLCHOLINE RECEPTOR AB, ALL
Acety choline binding ab: <0.03 nmol/L (ref 0.00–0.24)
Acetylchol Block Ab: 14 % (ref 0–25)

## 2020-12-16 LAB — GLUCOSE, CAPILLARY
Glucose-Capillary: 70 mg/dL (ref 70–99)
Glucose-Capillary: 78 mg/dL (ref 70–99)
Glucose-Capillary: 204 mg/dL — ABNORMAL HIGH (ref 70–99)
Glucose-Capillary: 120 mg/dL — ABNORMAL HIGH (ref 70–99)

## 2020-12-16 LAB — PARATHYROID HORMONE, INTACT (NO CA): PTH: 205 pg/mL — ABNORMAL HIGH (ref 15–65)

## 2020-12-16 LAB — ALDOLASE: Aldolase: 8.9 U/L (ref 3.3–10.3)

## 2020-12-16 MED ORDER — ALTEPLASE 2 MG IJ SOLR
INTRAMUSCULAR | Status: AC
  Filled 2020-12-16: qty 2

## 2020-12-16 MED ORDER — EPOETIN ALFA 10000 UNIT/ML IJ SOLN
4000.0000 [IU] | INTRAMUSCULAR | Status: DC
  Administered 2020-12-16: 4000 [IU] via INTRAVENOUS

## 2020-12-16 NOTE — Progress Notes (Signed)
Pt with periods of hr up into 120s, but not sustained, after speaking with breeze, np, turned uf off and will finish treatment with uf off.

## 2020-12-16 NOTE — Progress Notes (Addendum)
PT Cancellation Note  Patient Details Name: KESHA HURRELL MRN: [id number] DOB: November 02, 1945   Cancelled Treatment:    850: Pt is currently working with SLP. Will return later this date and continue to follow per current POC.   1045: Pt is currently off floor in HD. Will return at later time when pt is available/appropriate.    Rushie Chestnut 12/16/2020, 8:50 AM

## 2020-12-16 NOTE — Progress Notes (Signed)
Neurology Progress Note  Prior HPI by Dr. Otelia Limes: "Jamie Benitez is an 75 y.o. female with CAD, CHF, CKD 3, DM2, HLD, HTN, chronic cough, neuropathy, osteoporosis and history of Bell's palsy, who presented to the hospital on 9/21 with fatigue and diffuse weakness after having had diarrhea for the past several days prior to admission. She was found to have acutely worsened renal function on her CKD3, and was D-dimer positive with elevated troponin and EKG changes, but overall pattern did not militate in favor of STEMI or NSTEMI per cardiology. She was felt possibly to have decompensation of her CHF as well, but could not be given diuretics due to her abnormal renal function. During her stay her renal function continued to worsen and Nephrology was consulted. She also had new onset of atrial fibrillation with RVR and was started on amiodarone. She has had persistent hypotension during this admission [improved with midodrine]   This morning, she had a spell witnessed by her RN that appeared most consistent with a new onset seizure. The description of the event is as follows: "At approximately 0615, phlebotomist approached pt's primary RN & informed her that the pt was "drooling"; and was that normal? Upon entering pt's room, Pt was noted to have foam around her mouth, eyes rolled up in her head. VS obtained, 130/65, HR 80 (remains in a-fib), SpO2 98% on 2L [place], CBG 136. Pt not responsive to painful stimuli, but would open eyes and not focus on anything in the room in particular. Rapid response called by other RN's, ICU CN and AC arrived and were informed of situation. On call provider messaged, orders received..""  Subjective/Interval events Started dialysis 9/26 Disoriented today, states year is 29 States she is doing poorly, mostly bc she doesn't want to be in the hospital anymore   Relevant Data   Basic Metabolic Panel: Recent Labs  Lab 12/12/20 1158 12/13/20 0730 12/14/20 0655 12/15/20 0620  12/16/20 0441  NA 128* 128* 131* 133* 133*  K 2.8* 3.7 3.7 3.3* 4.1  CL 82* 86* 90* 92* 98  CO2 27 19* 26 27 22   GLUCOSE 105* 143* 121* 127* 118*  BUN 75* 80* 89* 66* 56*  CREATININE 5.06* 6.07* 6.24* 5.41* 4.39*  CALCIUM 6.0* 6.3* 6.5* 7.0* 7.2*  PHOS  --   --   --  4.9*  --      CBC: Recent Labs  Lab 12/12/20 0355 12/12/20 1158 12/13/20 0730 12/14/20 0655 12/15/20 0620  WBC 16.0* 16.0* 17.6* 12.4* 10.5  NEUTROABS  --  14.1*  --   --   --   HGB 9.8* 9.8* 10.0* 8.7* 8.4*  HCT 27.5* 27.1* 28.6* 23.6* 22.6*  MCV 78.8* 77.4* 79.9* 74.4* 74.8*  PLT 155 173 155 185 193     Lab Results  Component Value Date   HGBA1C 5.8 (H) 12/09/2020   Lab Results  Component Value Date   CHOL 94 12/10/2020   HDL 33 (L) 12/10/2020   LDLCALC 37 12/10/2020   TRIG 120 12/10/2020   CHOLHDL 2.8 12/10/2020     MRI brain wo contrast Mildly motion degraded exam. 8 mm acute infarct within the left parietal white matter. Additional suspected punctate acute infarct within the left parietal cortex. Background mild chronic small-vessel ischemic changes within the cerebral white matter. Small chronic lacunar infarcts within the left thalamus and right cerebellar hemisphere. Mild generalized parenchymal atrophy.  MRA head  Intracranial atherosclerotic disease with multifocal stenoses, as outlined and with findings most notably  as follows.   Sites of severe stenosis within mid M2 left MCA vessels.   The P1 posterior cerebral artery segments are hypoplastic with superimposed severe stenoses bilaterally. However, there are sizable bilateral posterior communicating arteries which are widely patent. Severe stenosis within the proximal P2 right PCA. Severe right PCA distal branch atherosclerotic irregularity.   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  MRA neck  Severely degraded by motion. Within that limitation, there is no visualized  occlusion of the carotid or vertebral arteries.  All CNS imaging personally reviewed: I agree with the above interpretations  KUB 2/27 2 AM Moderate diffuse gaseous dilatation of the bowel, possible colonileus though if distal obstruction is a concern, further evaluation with CT should be considered  TTE  1. Left ventricular ejection fraction, by estimation, is 25 to 30%. The left ventricle has severely decreased function. The left ventricle demonstrates regional wall motion abnormalities (see scoring diagram/findings for description). The left ventricular internal cavity size was mildly dilated. There is moderate left ventricular hypertrophy. Left ventricular diastolic parameters are  indeterminate. There is akinesis of the left ventricular, entire inferior wall and inferolateral wall.   2. Right ventricular systolic function is normal. The right ventricular size is normal. Tricuspid regurgitation signal is inadequate for assessing PA pressure.   3. Left atrial size was moderately dilated.   4. Right atrial size was mildly dilated.   5. The mitral valve is normal in structure. Moderate mitral valve regurgitation. No evidence of mitral stenosis.   6. The aortic valve is normal in structure. Aortic valve regurgitation is not visualized. Mild to moderate aortic valve sclerosis/calcification is present, without any evidence of aortic stenosis.  Conclusion(s)/Recommendation(s): No evidence of valvular vegetations on this transthoracic echocardiogram.   TEE pending GoC / clincial stability   Carotid ultrasound - no hemodynamically-significant stenosis  EEG 9/26 was obtained while awake and drowsy and is abnormal due to moderate diffuse slowing indicative of global cerebral dysfunction. There were no seizures or other epileptiform abnormalities seen during this recording.  O:  Vitals:   12/16/20 1559 12/16/20 1935  BP: (!) 170/60 (!) 147/106  Pulse: 96 72  Resp: 16 19  Temp: 97.8 F (36.6 C)  98.5 F (36.9 C)  SpO2: 92%    General: alert, oriented to self and hospital, NAD Cardiac: Irregularly irregular rhythm, perfusing extremities well Abdomen: Obese/distended but soft and nontender Extremities: Edematous with bruising at various IV sites  Examination  Mental Status: alert, oriented to self and hospital, Speech is fluent with naming appropriate for simple objects.  Repetition intact. Able to follow all simple commands but not multistep.  Reports it is 1992. Cranial Nerves: II: OD: Visual fields intact with no extinction to DSS.  OS: Blind PERRL, except for a left APD.  III,IV, VI: Eyelids are slightly drooping symmetrically. Eyes are dysconjugate with decreased motility OS. V: Temp sensation equal bilaterally VII: Smile slightly asymmetric in the context of her history of Bell's palsy (decreased NL fold on the right) on prior examination VIII: Intact to voice IX,X: No hypophonia XI: Head is midline. XII: Midline tongue extension Motor: RUE:  Shoulder pain due to old rotator cuff injury limits shoulder movement. Able to lift and push with forearm with 3/5 strength. Grip 4-/5 LUE: With support and maintains arm up for about 1 second before it drifts down to the bed. Able to lift with forearm with 3/5 strength. Grip 4-/5 RLE: Cannot lift off bed  with hip flexion. Can internally and externally rotate leg. Does not allow examiner to lift leg to test knee extension due to "pain all over" when passively moved. ADF 3/5.  LLE: Cannot lift off bed with hip flexion. Can internally and externally rotate leg. Does not allow examiner to lift leg to test knee extension due to "pain all over" when passively moved. ADF 3/5. Sensory: Intact to light touch in all 4 extremities without extinction to double simultaneous stimulus Deep Tendon Reflexes: 2+ bilateral biceps.  Unable to elicit in bilateral patellae.  Cerebellar: Unable to assess due to inability to lift arms or legs off bed.  Gait:  Unable to assess    Assessment: 75 year old female presenting with diffuse weakness in the setting of multiple comorbidities including hypokalemia, CHF and AKI on CKD as well as MSSA bacteremia. She experienced a spell c/f seizure 9/25 and Neurology was consulted. She has no prior history of seizures. Head CT shows normal appearance of the brain for age.  . 1. Exam generally stable since last week 2. Etiology for her seizure is being worked up. Could have been triggered by her acute ischemic stroke combined hypocalcemia and mild hyponatremia.  She was briefly on phenytoin while awaiting dialysis but after starting dialysis was transitioned to keppra 500mg  daily + 250mg  after every dialysis 3. Has been hypotensive this admission, but BP levels this AM around the time of her seizure were normal, therefore a syncopal convulsion is felt to be unlikely.  4. Renal failure, started dialysis 9/26 5. Regarding her weakness, it is most likely multifactorial, with pain, hypokalemia, AKI and diffuse fatigue in the setting of CHF with EF of 25 to 30% and severely decreased LV function. Reflexes are of decreased amplitudes, but present. Low on the DDx but possible would be MG; ordered MG panel. Doubt AIDP given presence of reflexes several days since presentation and more likely explanation of multifactorial etiology.  Given proximal greater than distal weakness with statin being held for concern for myopathy. CK elevated today at 437, will continue to trend. Aldolase WNL 6. MSSA bacteremia on cefazolin  7.  Initially on IV heparin drip for concern for ACS, subsequently discontinued and then restarted 9/26 evening   Recommendations:  # Seizure, encephalopathy > EEG without evidence of seizure activity - Keppra 500 mg daily with an additional 250 mg to be given postdialysis.  Room to increase to up to 1000 mg daily if patient has breakthrough seizures - Inpatient seizure precautions.  - Ativan 2 mg IV PRN  seizure and call Neurology - No driving for 6 mos after last seizure according to [place] law. Do not bathe alone, climb ladders, operate heavy machinery or do any other activity that could be dangerous if you had a seizure while doing it, full seizure precuations to be included in discharge instructions as below  - Consider discontinuing trazodone, (chronic medications since at least 09/2017)  - Agree with holding tramadol, on discharge would replace with a medication that reduces seizure threshold less - May consider weaning antiseizure medications on an outpatient basis but would continue at this dose for now  # Stroke w/u > Etiology likely cardioembolic (A. fib versus endocarditis), although she also has culprit stenoses within the MCA so atheroembolic is also possibility.  TTE has been negative for vegetations.  TEE would be more sensitive to rule out endocarditis but will clarify whether this is within patient's goals of care.  At this time given lack of hemorrhagic  conversion even on heparin drip - TEE to more definitively rule out endocarditis pending clinical stability/goals of care  # Generalized weakness  - Atorvastatin d/c'd for now given weakness which will be further worked [employer] the outpatient setting (and LDL currently 33) - Please note in d/c summary that myasthenia gravis panel is pending and should be followed up by PCP as outpatient - Trend CK  Will f/u outstanding studies.  Appreciate primary team's management of her comorbidities and ID and nephrology following along  Standard seizure precautions: Per [facility] statutes, patients with seizures are not allowed to drive until  they have been seizure-free for six months. Use caution when using heavy equipment or power tools. Avoid working on ladders or at heights. Take showers instead of baths. Ensure the water temperature is not too high on the home water heater. Do not go swimming alone. When caring for infants or small  children, sit down when holding, feeding, or changing them to minimize risk of injury to the child in the event you have a seizure.  To reduce risk of seizures, maintain good sleep hygiene avoid alcohol and illicit drug use, take all anti-seizure medications as prescribed.    Bing Neighbors, MD [facility] [phone]  If 7pm- 7am, please page neurology on call as listed in [facility].

## 2020-12-16 NOTE — Progress Notes (Signed)
[facility]  PROGRESS NOTE   Subjective:   Patient seen and evaluated during dialysis   HEMODIALYSIS FLOWSHEET:  Blood Flow Rate (mL/min): 300 mL/min Arterial Pressure (mmHg): -130 mmHg Venous Pressure (mmHg): 130 mmHg Transmembrane Pressure (mmHg): 50 mmHg Ultrafiltration Rate (mL/min): 330 mL/min Dialysate Flow Rate (mL/min): 500 ml/min Conductivity: Machine : 13.9 Conductivity: Machine : 13.9  Continues to moan and request water during treatment    Objective:  Vital signs in last 24 hours:  Temp:  [97.6 F (36.4 C)-98.6 F (37 C)] 97.6 F (36.4 C) (09/28 0758) Pulse Rate:  [79-110] 80 (09/28 0758) Resp:  [15-20] 17 (09/28 0758) BP: (113-146)/(54-107) 146/79 (09/28 0758) SpO2:  [92 %-99 %] 98 % (09/28 0758)  Weight change:  Filed Weights   12/09/20 0354 12/13/20 0503  Weight: 102.1 kg 98.3 kg    Intake/Output: I/O last 3 completed shifts: In: 295.9 [P.O.:120; IV Piggyback:175.9] Out: 650 [Urine:150; Other:500]   Intake/Output this shift:  Total I/O In: 24.1 [IV Piggyback:24.1] Out: -   Physical Exam: General:  No acute distress  Head:  Normocephalic, atraumatic. Moist oral mucosal membranes  Eyes:  Anicteric  Lungs:   Clear to auscultation, normal effort  Heart:  S1S2 no rubs, irregular   Abdomen:   Soft, nontender, bowel sounds present  Extremities:  no peripheral edema.  Neurologic:  Awake, alert, following commands  Skin:  No lesions  Access: Rt IJ temp cath    Basic Metabolic Panel: Recent Labs  Lab 12/12/20 1158 12/13/20 0730 12/14/20 0655 12/15/20 0620 12/16/20 0441  NA 128* 128* 131* 133* 133*  K 2.8* 3.7 3.7 3.3* 4.1  CL 82* 86* 90* 92* 98  CO2 27 19* 26 27 22   GLUCOSE 105* 143* 121* 127* 118*  BUN 75* 80* 89* 66* 56*  CREATININE 5.06* 6.07* 6.24* 5.41* 4.39*  CALCIUM 6.0* 6.3* 6.5* 7.0* 7.2*  PHOS  --   --   --  4.9*  --      CBC: Recent Labs  Lab 12/12/20 0355 12/12/20 1158 12/13/20 0730 12/14/20 0655  12/15/20 0620  WBC 16.0* 16.0* 17.6* 12.4* 10.5  NEUTROABS  --  14.1*  --   --   --   HGB 9.8* 9.8* 10.0* 8.7* 8.4*  HCT 27.5* 27.1* 28.6* 23.6* 22.6*  MCV 78.8* 77.4* 79.9* 74.4* 74.8*  PLT 155 173 155 185 193      Urinalysis: No results for input(s): COLORURINE, LABSPEC, PHURINE, GLUCOSEU, HGBUR, BILIRUBINUR, KETONESUR, PROTEINUR, UROBILINOGEN, NITRITE, LEUKOCYTESUR in the last 72 hours.  Invalid input(s): APPERANCEUR    Imaging: MR ANGIO NECK WO CONTRAST  Result Date: 12/15/2020 CLINICAL DATA:  Acute neurologic deficit EXAM: MRA NECK WITHOUT CONTRAST TECHNIQUE: Angiographic images of the neck were acquired using MRA technique without intravenous contrast. Carotid stenosis measurements (when applicable) are obtained utilizing NASCET criteria, using the distal internal carotid diameter as the denominator. COMPARISON:  No pertinent prior exam. FINDINGS: The images are severely degraded by motion. Within that limitation, the carotid arteries appear to be patent from the bifurcation to the skull base. The V2 segments of the vertebral arteries also appear to be patent. IMPRESSION: Severely degraded by motion. Within that limitation, there is no visualized occlusion of the carotid or vertebral arteries. Electronically Signed   By: Ulyses Jarred M.D.   On: 12/15/2020 02:32   US Carotid Bilateral  Result Date: 12/15/2020 CLINICAL DATA:  75 year old female with a history of stroke EXAM: BILATERAL CAROTID DUPLEX ULTRASOUND TECHNIQUE: Pearline Cables scale imaging, color  Doppler and duplex ultrasound were performed of bilateral carotid and vertebral arteries in the neck. COMPARISON:  None. FINDINGS: Criteria: Quantification of carotid stenosis is based on velocity parameters that correlate the residual internal carotid diameter with NASCET-based stenosis levels, using the diameter of the distal internal carotid lumen as the denominator for stenosis measurement. The following velocity measurements were obtained:  RIGHT ICA:  Systolic 86 cm/sec, Diastolic 17 cm/sec CCA:  82 cm/sec SYSTOLIC ICA/CCA RATIO:  1.1 ECA:  74 cm/sec LEFT ICA:  Systolic 83 cm/sec, Diastolic 25 cm/sec CCA:  883 cm/sec SYSTOLIC ICA/CCA RATIO:  0.8 ECA:  69 cm/sec Right Brachial SBP: Not acquired Left Brachial SBP: Not acquired RIGHT CAROTID ARTERY: No significant calcifications of the right common carotid artery. Intermediate waveform maintained. Moderate heterogeneous and partially calcified plaque at the right carotid bifurcation. No significant lumen shadowing. Low resistance waveform of the right ICA. No significant tortuosity. RIGHT VERTEBRAL ARTERY: Antegrade flow with low resistance waveform. LEFT CAROTID ARTERY: No significant calcifications of the left common carotid artery. Intermediate waveform maintained. Moderate heterogeneous and partially calcified plaque at the left carotid bifurcation. No significant lumen shadowing. Low resistance waveform of the left ICA. No significant tortuosity. LEFT VERTEBRAL ARTERY:  Antegrade flow with low resistance waveform. IMPRESSION: Color duplex indicates moderate heterogeneous and calcified plaque, with no hemodynamically significant stenosis by duplex criteria in the extracranial cerebrovascular circulation. Signed, Dulcy Fanny. Dellia Nims, RPVI [facility] Radiology Electronically Signed   By: Corrie Mckusick D.O.   On: 12/15/2020 16:15   DG Abd Portable 1V  Result Date: 12/14/2020 CLINICAL DATA:  Abdomen pain EXAM: PORTABLE ABDOMEN - 1 VIEW COMPARISON:  None. FINDINGS: Moderate diffuse gaseous distension of the colon measuring up to 8 cm. Frothy appearance in the right colon is presumably due to stool. No definite dilated central small bowel. IMPRESSION: Moderate diffuse gaseous dilatation of the bowel, possible colon ileus though if distal obstruction is a concern, further evaluation with CT should be considered Electronically Signed   By: Donavan Foil  M.D.   On: 12/14/2020 20:58   EEG adult  Result Date: 12/14/2020 Derek Jack, MD     12/14/2020  3:49 PM Routine EEG Report Jamie Benitez is a 75 y.o. female with a history of seizure who is undergoing an EEG to evaluate for seizures. Report: This EEG was acquired with electrodes placed according to the International 10-20 electrode system (including Fp1, Fp2, F3, F4, C3, C4, P3, P4, O1, O2, T3, T4, T5, T6, A1, A2, Fz, Cz, Pz). The following electrodes were missing or displaced: none. The occipital dominant rhythm was 6 Hz. This activity is reactive to stimulation. Drowsiness was manifested by background fragmentation; deeper stages of sleep were not identified. There was no focal slowing. There were no interictal epileptiform discharges. There were no electrographic seizures identified. There was no abnormal response to photic stimulation. Hyperventilation was not performed. Impression & clinical correlation: This EEG was obtained while awake and drowsy and is abnormal due to moderate diffuse slowing indicative of global cerebral dysfunction. There were no seizures or other epileptiform abnormalities seen during this recording. Su Monks, MD [facility] [phone] If 7pm- 7am, please page neurology on call as listed in [place].     Medications:     ceFAZolin (ANCEF) IV Stopped (12/16/20 0109)   dextrose 5 % and 0.9% NaCl 60 mL/hr at 12/15/20 0230   levETIRAcetam 500 mg (12/16/20 0850)   And   levETIRAcetam  amiodarone  200 mg Oral BID   calcitRIOL  0.25 mcg Oral Daily   calcium carbonate  1,000 mg of elemental calcium Oral BID WC   chlorhexidine gluconate (MEDLINE KIT)  15 mL Mouth Rinse BID   Chlorhexidine Gluconate Cloth  6 each Topical Daily   clopidogrel  75 mg Oral Daily   heparin injection (subcutaneous)  5,000 Units Subcutaneous Q8H   insulin aspart  0-5 Units Subcutaneous QHS   insulin aspart  0-9 Units Subcutaneous TID WC   lidocaine  1 patch Transdermal  Q24H   mouth rinse  15 mL Mouth Rinse 10 times per day   midodrine  10 mg Oral TID WC   pantoprazole  40 mg Oral Daily   sodium chloride flush  10-40 mL Intracatheter Q12H   traZODone  50 mg Oral QHS    Assessment/ Plan:     Principal Problem:   NSTEMI (non-ST elevated myocardial infarction) ([place]) Active Problems:   Chronic systolic CHF (congestive heart failure) (HCC)   Chronic back pain   Acute renal failure (HCC)   Depression with anxiety   Type II diabetes mellitus with renal manifestations (HCC)   CAD (coronary artery disease)   HLD (hyperlipidemia)   HTN (hypertension)   Hypokalemia   Leukocytosis   Acute decompensated heart failure (HCC)   Pressure injury of skin   Generalized weakness   DNR (do not resuscitate)   Palliative care by specialist  75 year old white female with a history of hypertension, coronary artery disease, congestive heart failure, diabetes, hyperlipidemia, chronic kidney disease now admitted with history of generalized weakness.  She is found to have Staphylococcus sepsis.  She also has acute kidney injury on the top of chronic kidney disease.   #1: Acute kidney injury: Patient with acute kidney injury on the top of chronic kidney disease.  She has CKD stage IIIb with a GFR of 44 cc/min previously. On admission, creatinine  2.02 with eGFR 26.  Dialysis believed to be temporary due to AKI. Creatinine continues to improve. Currently receiving third dialysis treatment today, tolerated fair. Will need patient to sit in chair to prepare for outpatient dialysis. No dialysis scheduled for tomorrow, but will attempt to dialyze in chair on Friday.    #2: Hypokalemia:  likely secondary to bicarbonate infusion.  Potassium 4.1. will continue to monitor  #3: Metabolic acidosis: Resolved.    #4: Dehydration/hyponatremia: Sodium improved to 133. Will continue to correct with dialysis   #5: Anemia: likely due to chronic kidney disease.  Hgb 8.4 Continue iron  supplementation and will order low dose EPO with dialysis treatments  #6: Seizure activity: Etiology is unclear.  Patient is being followed by neurology.  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 Also 61m vascular protrusion from right ICA. EEG on 12/14/20 negative for seizure activity. RN reported seizure on 12/15/20. Keppra scheduled with Ativan available PRN  #7: Hypocalcemia: Slowly improving. Continue calcium carbonate twice daily    LOS: 6[facility] 9/28/20229:17 AM

## 2020-12-16 NOTE — Progress Notes (Signed)
PROGRESS NOTE    Jamie Benitez  LDJ:[id number] DOB: December 03, 1945 DOA: 12/09/2020 PCP: Derinda Late, MD    Brief Narrative:  75 y.o. female with medical history significant of sCHF with EF 35%, CAD with stent placement, CKD-3A, chronic back pain, Bell's palsy, HTN, hyperlipidemia, diabetes mellitus, GERD, depression with anxiety, who presents with weakness.   Patient states that she has generalized weakness for more than 4 days.  No unilateral numbness or tingling to extremities.  No facial droop or slurred speech.  Patient has dry cough, denies chest pain or shortness breath.  No fever or chills.  Patient states that she had diarrhea in the past several days, which has resolved.  Currently no nausea, vomiting, diarrhea or abdominal pain.  No symptoms of UTI.  Patient complains of chronic lower back pain. Initially patient had oxygen desaturation to mid 80s, which improved to 98% on room air in ED.   Initial trop 105 with ST depression and T wave inversion in inferior leads and anterior leads.  Dr. Saunders Revel of cardiology was consulted, who did not think patient had STEMI. He recommended [facility] cardiology consult. IV heparin is started in ED  Cardiology patient's presentation is inconsistent with NSTEMI.  No significant delta in troponin.  VQ scan and lower extremity duplex negative for VTE.  Patient did have elements of mild BNP elevation and small left pleural effusion possible decompensated heart failure.  Was not given diuretics due to elevated kidney function.  Subsequently patient went into a tachyarrhythmia likely atrial fibrillation which she does not have a history of.  Also creatinine worsened from 2-4.2.  Nephrology consult 9/22.  9/23: Patient was moved to stepdown unit for closer monitoring due to persistent hypotension.  Not requiring vasopressor support.  Remains on amiodarone.  Remains in atrial fibrillation, rate control improved.  Kidney function continues to deteriorate.  Discussed with  nephrology.  Patient still undecided about initiation of hemodialysis  9/25: Early this morning patient had RRT called for decreased level of mentation and possible seizure activity.  Neurology consult requested.  Stat CT head negative for mass or bleed.  ABG with mild hypoxia but otherwise reassuring.  Neurology ordering phenytoin and routine EEG as well as MRI brain.  Patient remains encephalopathic and unable to provide history.  9/26: Patient little bit more awake today.  Grandson at bedside.  Patient had temporary dialysis line placed in right IJ on 9/25.  To undergo hemodialysis today.   Assessment & Plan:   Principal Problem:   NSTEMI (non-ST elevated myocardial infarction) ([place]) Active Problems:   Chronic systolic CHF (congestive heart failure) (HCC)   Chronic back pain   Acute renal failure (HCC)   Depression with anxiety   Type II diabetes mellitus with renal manifestations (HCC)   CAD (coronary artery disease)   HLD (hyperlipidemia)   HTN (hypertension)   Hypokalemia   Leukocytosis   Acute decompensated heart failure (HCC)   Pressure injury of skin   Generalized weakness   DNR (do not resuscitate)   Palliative care by specialist  Possible new onset seizure 8 mm acute infarct in left parietal white matter Patient had shaking activity and encephalopathy noted early 9/25 Seizure activity versus uremic encephalopathy primary differentials Neurology consulted CT head negative MRI brain with small acute infarct Plan: --cont IV Keppra --cont plavix  Acute renal failure superimposed on stage 3a chronic kidney disease ([place]):  Started on HD Metabolic acidosis  Hyponatremia recent baseline creatinine 1.2 on 10/28/2019.   Her creatinine  is at 2.02, BUN 35 on admission Creatinine progressively worsening Dialysis catheter placed 9/25 HD 9/26 Plan: iHD per nephrology --d/c MIVF  Possible MSSA bacteremia Unclear what this this represents infection or true  contaminant Does not meet sepsis criteria No clear source identified TTE negative MRI spine negative Plan: Continue Ancef --defer TEE for now since daughter is trying to decide goals of care  NSTEMI, ruled out history of CAD: S/p of stent placement  trop  105 --> 93 --> 93.   Patient does not have chest pain, but has generalized weakness.   Dr. Clayborn Bigness of cardiology is consulted.   D-dimer is positive, but VQ scan negative for PE.   Lower extremity Dopplers negative for DVT. Per cardiology patient's presentation is inconsistent with NSTEMI Plan: --cont plavix  New onset atrial fibrillation with rapid ventricular response Noted on telemetry 9/22 Ventricular rates up to 130s Plan: --cont amiodarone 200 mg BID  Chronic systolic CHF (congestive heart failure) ([place]) 2D echo on 05/22/2017 showed EF of 35.  Patient has 1+ leg edema, elevated BNP 398, indicating possible fluid overload.   Patient has progressively deteriorating kidney function.  --iHD for volume removal   Chronic back pain -As needed Tylenol --oxycodone PRN  Depression with anxiety -Continue home medications   Type II diabetes mellitus with renal manifestations (HCC) Recent A1c 5.8, well controlled.   Patient is taking 70/30 NPH insulin and glucose at home. --has not been receiving 70/30 or many units of SSI, and BG have been mostly within inpatient goal. --d/c BG checks and SSI   HLD (hyperlipidemia) -Lipitor   HTN (hypertension) --BP varied widely --Hold BP meds for now   Hypokalemia Monitor and replace as necessary Maintain K greater than 4, mag greater than 2  Possible bile duct dilatation Noted partial on MRI LFTs normal Defer biliary tract imaging for now   DVT prophylaxis: SQ heparin Code Status: DNR Family Communication: updated daughter on the phone today  Disposition Plan: Status is: Inpatient  Dispo: The patient is from: Home              Anticipated d/c is to: SNF               Patient currently is not medically stable to d/c.  On IV abx.      Difficult to place patient No    Level of care: Progressive Cardiac  Consultants:  Nephrology Cardiology  Procedures:  None  Antimicrobials:  Cefazolin   Subjective: Pt complained of back pain, abdominal pain.  Called daughter, who is not sure about wanting to pursue aggressive care, said that her mother wouldn't want to be in pain nor in a SNF.   Objective: Vitals:   12/16/20 1238 12/16/20 1409 12/16/20 1559 12/16/20 1935  BP:  (!) 142/77 (!) 170/60 (!) 147/106  Pulse:  85 96 72  Resp: (!) 22 18 16 19   Temp:  98.8 F (37.1 C) 97.8 F (36.6 C) 98.5 F (36.9 C)  TempSrc:   Axillary   SpO2:  97% 92%   Weight:      Height:        Intake/Output Summary (Last 24 hours) at 12/16/2020 1943 Last data filed at 12/16/2020 1800 Gross per 24 hour  Intake 787.19 ml  Output 871 ml  Net -83.81 ml   Filed Weights   12/09/20 0354 12/13/20 0503  Weight: 102.1 kg 98.3 kg    Examination:  Constitutional: NAD, alert, confused, moaning in between sentences HEENT: conjunctivae  and lids normal, EOMI CV: No cyanosis.   RESP: normal respiratory effort, gurgling breath sounds, on 2L Extremities: swelling in all extremities SKIN: warm, dry, extensive bruising Neuro: II - XII grossly intact.   Psych: depressed mood and affect.     Data Reviewed: I have personally reviewed following labs and imaging studies  CBC: Recent Labs  Lab 12/12/20 0355 12/12/20 1158 12/13/20 0730 12/14/20 0655 12/15/20 0620  WBC 16.0* 16.0* 17.6* 12.4* 10.5  NEUTROABS  --  14.1*  --   --   --   HGB 9.8* 9.8* 10.0* 8.7* 8.4*  HCT 27.5* 27.1* 28.6* 23.6* 22.6*  MCV 78.8* 77.4* 79.9* 74.4* 74.8*  PLT 155 173 155 185 878   Basic Metabolic Panel: Recent Labs  Lab 12/12/20 1158 12/13/20 0730 12/14/20 0655 12/15/20 0620 12/16/20 0441  NA 128* 128* 131* 133* 133*  K 2.8* 3.7 3.7 3.3* 4.1  CL 82* 86* 90* 92* 98  CO2 27 19*  26 27 22   GLUCOSE 105* 143* 121* 127* 118*  BUN 75* 80* 89* 66* 56*  CREATININE 5.06* 6.07* 6.24* 5.41* 4.39*  CALCIUM 6.0* 6.3* 6.5* 7.0* 7.2*  PHOS  --   --   --  4.9*  --    GFR: Estimated Creatinine Clearance: 12 mL/min (A) (by C-G formula based on SCr of 4.39 mg/dL (H)). Liver Function Tests: Recent Labs  Lab 12/11/20 0627 12/11/20 1815 12/12/20 1158 12/15/20 1252  AST 76* 101* 101* 34  ALT 13 11 7  <5  ALKPHOS 82 93 110 115  BILITOT 0.7 0.7 0.7 0.9  PROT 5.3* 4.2* 4.8* 4.8*  ALBUMIN 2.3*  2.4* 1.8* 2.0* 1.7*   No results for input(s): LIPASE, AMYLASE in the last 168 hours.  No results for input(s): AMMONIA in the last 168 hours. Coagulation Profile: No results for input(s): INR, PROTIME in the last 168 hours.  Cardiac Enzymes: Recent Labs  Lab 12/15/20 1252  CKTOTAL 437*   BNP (last 3 results) No results for input(s): PROBNP in the last 8760 hours. HbA1C: No results for input(s): HGBA1C in the last 72 hours.  CBG: Recent Labs  Lab 12/15/20 1645 12/15/20 2104 12/16/20 0800 12/16/20 1351 12/16/20 1559  GLUCAP 125* 200* 70 78 204*   Lipid Profile: No results for input(s): CHOL, HDL, LDLCALC, TRIG, CHOLHDL, LDLDIRECT in the last 72 hours.  Thyroid Function Tests: No results for input(s): TSH, T4TOTAL, FREET4, T3FREE, THYROIDAB in the last 72 hours.  Anemia Panel: No results for input(s): VITAMINB12, FOLATE, FERRITIN, TIBC, IRON, RETICCTPCT in the last 72 hours. Sepsis Labs: Recent Labs  Lab 12/11/20 6767 12/11/20 0832 12/11/20 1040  LATICACIDVEN 1.2 1.3 1.7    Recent Results (from the past 240 hour(s))  Resp Panel by RT-PCR (Flu A&B, Covid) Nasopharyngeal Swab     Status: None   Collection Time: 12/09/20  4:34 AM   Specimen: Nasopharyngeal Swab; Nasopharyngeal(NP) swabs in vial transport medium  Result Value Ref Range Status   SARS Coronavirus 2 by RT PCR NEGATIVE NEGATIVE Final    Comment: (NOTE) SARS-CoV-2 target nucleic acids are NOT  DETECTED.  The SARS-CoV-2 RNA is generally detectable in upper respiratory specimens during the acute phase of infection. The lowest concentration of SARS-CoV-2 viral copies this assay can detect is 138 copies/mL. A negative result does not preclude SARS-Cov-2 infection and should not be used as the sole basis for treatment or other patient management decisions. A negative result may occur with  improper specimen collection/handling, submission of specimen other  than nasopharyngeal swab, presence of viral mutation(s) within the areas targeted by this assay, and inadequate number of viral copies(<138 copies/mL). A negative result must be combined with clinical observations, patient history, and epidemiological information. The expected result is Negative.  Fact Sheet for Patients:  EntrepreneurPulse.com.au  Fact Sheet for Healthcare Providers:  IncredibleEmployment.be  This test is no t yet approved or cleared by the [place] FDA and  has been authorized for detection and/or diagnosis of SARS-CoV-2 by FDA under an Emergency Use Authorization (EUA). This EUA will remain  in effect (meaning this test can be used) for the duration of the COVID-19 declaration under Section 564(b)(1) of the Act, 21 U.S.C.section 360bbb-3(b)(1), unless the authorization is terminated  or revoked sooner.       Influenza A by PCR NEGATIVE NEGATIVE Final   Influenza B by PCR NEGATIVE NEGATIVE Final    Comment: (NOTE) The Xpert Xpress SARS-CoV-2/FLU/RSV plus assay is intended as an aid in the diagnosis of influenza from Nasopharyngeal swab specimens and should not be used as a sole basis for treatment. Nasal washings and aspirates are unacceptable for Xpert Xpress SARS-CoV-2/FLU/RSV testing.  Fact Sheet for Patients: EntrepreneurPulse.com.au  Fact Sheet for Healthcare Providers: IncredibleEmployment.be  This test is not yet  approved or cleared by the [place] FDA and has been authorized for detection and/or diagnosis of SARS-CoV-2 by FDA under an Emergency Use Authorization (EUA). This EUA will remain in effect (meaning this test can be used) for the duration of the COVID-19 declaration under Section 564(b)(1) of the Act, 21 U.S.C. section 360bbb-3(b)(1), unless the authorization is terminated or revoked.  Performed at [facility], [address] [place], [place] [address]   Blood culture (routine single)     Status: Abnormal   Collection Time: 12/09/20  4:34 AM   Specimen: BLOOD  Result Value Ref Range Status   Specimen Description   Final    BLOOD LEFT ARM Performed at [facility], [address] [place], [place] [address]    Special Requests   Final    BOTTLES DRAWN AEROBIC AND ANAEROBIC Blood Culture adequate volume Performed at [address] [place], [place] [address]    Culture  Setup Time   Final    GRAM POSITIVE COCCI IN BOTH AEROBIC AND ANAEROBIC BOTTLES CRITICAL RESULT CALLED TO, READ BACK BY AND VERIFIED WITH: Aldona Bar RAEUR AT 3299 ON 12/09/20 BY SS Performed at [facility], [place] [address] [place], [place] [address]    Culture STAPHYLOCOCCUS AUREUS (A)  Final   Report Status 12/11/2020 FINAL  Final   Organism ID, Bacteria STAPHYLOCOCCUS AUREUS  Final      Susceptibility   Staphylococcus aureus - MIC*    CIPROFLOXACIN 1 SENSITIVE Sensitive     ERYTHROMYCIN <=0.25 SENSITIVE Sensitive     GENTAMICIN <=0.5 SENSITIVE Sensitive     OXACILLIN 0.5 SENSITIVE Sensitive     TETRACYCLINE <=1 SENSITIVE Sensitive     VANCOMYCIN <=0.5 SENSITIVE Sensitive     TRIMETH/SULFA <=10 SENSITIVE Sensitive     CLINDAMYCIN <=0.25 SENSITIVE Sensitive     RIFAMPIN <=0.5 SENSITIVE Sensitive     Inducible Clindamycin NEGATIVE Sensitive     * STAPHYLOCOCCUS AUREUS  Blood Culture ID Panel (Reflexed)     Status: Abnormal   Collection Time: 12/09/20   4:34 AM  Result Value Ref Range Status   Enterococcus faecalis NOT DETECTED NOT DETECTED Final   Enterococcus Faecium NOT DETECTED NOT DETECTED Final  Listeria monocytogenes NOT DETECTED NOT DETECTED Final   Staphylococcus species DETECTED (A) NOT DETECTED Final    Comment: CRITICAL RESULT CALLED TO, READ BACK BY AND VERIFIED WITH: SAMANTHA RAEUR AT 1750 ON 12/09/20 BY SS    Staphylococcus aureus (BCID) DETECTED (A) NOT DETECTED Final    Comment: CRITICAL RESULT CALLED TO, READ BACK BY AND VERIFIED WITH: SAMANTHA RAEUR AT 3403 ON 12/09/20 BY SS    Staphylococcus epidermidis NOT DETECTED NOT DETECTED Final   Staphylococcus lugdunensis NOT DETECTED NOT DETECTED Final   Streptococcus species NOT DETECTED NOT DETECTED Final   Streptococcus agalactiae NOT DETECTED NOT DETECTED Final   Streptococcus pneumoniae NOT DETECTED NOT DETECTED Final   Streptococcus pyogenes NOT DETECTED NOT DETECTED Final   A.calcoaceticus-baumannii NOT DETECTED NOT DETECTED Final   Bacteroides fragilis NOT DETECTED NOT DETECTED Final   Enterobacterales NOT DETECTED NOT DETECTED Final   Enterobacter cloacae complex NOT DETECTED NOT DETECTED Final   Escherichia coli NOT DETECTED NOT DETECTED Final   Klebsiella aerogenes NOT DETECTED NOT DETECTED Final   Klebsiella oxytoca NOT DETECTED NOT DETECTED Final   Klebsiella pneumoniae NOT DETECTED NOT DETECTED Final   Proteus species NOT DETECTED NOT DETECTED Final   Salmonella species NOT DETECTED NOT DETECTED Final   Serratia marcescens NOT DETECTED NOT DETECTED Final   Haemophilus influenzae NOT DETECTED NOT DETECTED Final   Neisseria meningitidis NOT DETECTED NOT DETECTED Final   Pseudomonas aeruginosa NOT DETECTED NOT DETECTED Final   Stenotrophomonas maltophilia NOT DETECTED NOT DETECTED Final   Candida albicans NOT DETECTED NOT DETECTED Final   Candida auris NOT DETECTED NOT DETECTED Final   Candida glabrata NOT DETECTED NOT DETECTED Final   Candida krusei NOT  DETECTED NOT DETECTED Final   Candida parapsilosis NOT DETECTED NOT DETECTED Final   Candida tropicalis NOT DETECTED NOT DETECTED Final   Cryptococcus neoformans/gattii NOT DETECTED NOT DETECTED Final   Meth resistant mecA/C and MREJ NOT DETECTED NOT DETECTED Final    Comment: Performed at [facility], [address] [place], [place] [address]  Urine Culture     Status: Abnormal   Collection Time: 12/09/20  9:24 PM   Specimen: In/Out Cath Urine  Result Value Ref Range Status   Specimen Description   Final    IN/OUT CATH URINE Performed at [facility], [address] [place], [place] [address]    Special Requests   Final    NONE Performed at [facility], [address] [place], [place] [address]    Culture MULTIPLE SPECIES PRESENT, SUGGEST RECOLLECTION (A)  Final   Report Status 12/11/2020 FINAL  Final  CULTURE, BLOOD (ROUTINE X 2) w Reflex to ID Panel     Status: None   Collection Time: 12/10/20  4:29 PM   Specimen: BLOOD  Result Value Ref Range Status   Specimen Description BLOOD LEFT ANTECUBITAL  Final   Special Requests   Final    BOTTLES DRAWN AEROBIC ONLY Blood Culture adequate volume   Culture   Final    NO GROWTH 5 DAYS Performed at [facility], [address] [place], [place] [address]    Report Status 12/15/2020 FINAL  Final  CULTURE, BLOOD (ROUTINE X 2) w Reflex to ID Panel     Status: None   Collection Time: 12/10/20  5:19 PM   Specimen: BLOOD  Result Value Ref Range Status   Specimen Description BLOOD BLOOD LEFT FOREARM  Final   Special Requests   Final  BOTTLES DRAWN AEROBIC AND ANAEROBIC Blood Culture adequate volume   Culture   Final    NO GROWTH 5 DAYS Performed at [facility], [address] [place], [place] [address]    Report Status 12/15/2020 FINAL  Final  MRSA Next Gen by PCR, Nasal     Status: None   Collection Time: 12/11/20  4:29 AM   Specimen: Nasal Mucosa; Nasal Swab  Result Value  Ref Range Status   MRSA by PCR Next Gen NOT DETECTED NOT DETECTED Final    Comment: (NOTE) The GeneXpert MRSA Assay (FDA approved for NASAL specimens only), is one component of a comprehensive MRSA colonization surveillance program. It is not intended to diagnose MRSA infection nor to guide or monitor treatment for MRSA infections. Test performance is not FDA approved in patients less than 4 years old. Performed at [facility], [address], [place], [place] [address]          Radiology Studies: MR ANGIO NECK WO CONTRAST  Result Date: 12/15/2020 CLINICAL DATA:  Acute neurologic deficit EXAM: MRA NECK WITHOUT CONTRAST TECHNIQUE: Angiographic images of the neck were acquired using MRA technique without intravenous contrast. Carotid stenosis measurements (when applicable) are obtained utilizing NASCET criteria, using the distal internal carotid diameter as the denominator. COMPARISON:  No pertinent prior exam. FINDINGS: The images are severely degraded by motion. Within that limitation, the carotid arteries appear to be patent from the bifurcation to the skull base. The V2 segments of the vertebral arteries also appear to be patent. IMPRESSION: Severely degraded by motion. Within that limitation, there is no visualized occlusion of the carotid or vertebral arteries. Electronically Signed   By: Ulyses Jarred M.D.   On: 12/15/2020 02:32   US Carotid Bilateral  Result Date: 12/15/2020 CLINICAL DATA:  75 year old female with a history of stroke EXAM: BILATERAL CAROTID DUPLEX ULTRASOUND TECHNIQUE: Pearline Cables scale imaging, color Doppler and duplex ultrasound were performed of bilateral carotid and vertebral arteries in the neck. COMPARISON:  None. FINDINGS: Criteria: Quantification of carotid stenosis is based on velocity parameters that correlate the residual internal carotid diameter with NASCET-based stenosis levels, using the diameter of the distal internal carotid lumen as the  denominator for stenosis measurement. The following velocity measurements were obtained: RIGHT ICA:  Systolic 86 cm/sec, Diastolic 17 cm/sec CCA:  82 cm/sec SYSTOLIC ICA/CCA RATIO:  1.1 ECA:  74 cm/sec LEFT ICA:  Systolic 83 cm/sec, Diastolic 25 cm/sec CCA:  098 cm/sec SYSTOLIC ICA/CCA RATIO:  0.8 ECA:  69 cm/sec Right Brachial SBP: Not acquired Left Brachial SBP: Not acquired RIGHT CAROTID ARTERY: No significant calcifications of the right common carotid artery. Intermediate waveform maintained. Moderate heterogeneous and partially calcified plaque at the right carotid bifurcation. No significant lumen shadowing. Low resistance waveform of the right ICA. No significant tortuosity. RIGHT VERTEBRAL ARTERY: Antegrade flow with low resistance waveform. LEFT CAROTID ARTERY: No significant calcifications of the left common carotid artery. Intermediate waveform maintained. Moderate heterogeneous and partially calcified plaque at the left carotid bifurcation. No significant lumen shadowing. Low resistance waveform of the left ICA. No significant tortuosity. LEFT VERTEBRAL ARTERY:  Antegrade flow with low resistance waveform. IMPRESSION: Color duplex indicates moderate heterogeneous and calcified plaque, with no hemodynamically significant stenosis by duplex criteria in the extracranial cerebrovascular circulation. Signed, Dulcy Fanny. Dellia Nims, RPVI [facility] Radiology Electronically Signed   By: Corrie Mckusick D.O.   On: 12/15/2020 16:15        Scheduled Meds:  alteplase       amiodarone  200 mg Oral BID   calcitRIOL  0.25 mcg Oral Daily   calcium carbonate  1,000 mg of elemental calcium Oral BID WC   chlorhexidine gluconate (MEDLINE KIT)  15 mL Mouth Rinse BID   Chlorhexidine Gluconate Cloth  6 each Topical Daily   clopidogrel  75 mg Oral Daily   epoetin (EPOGEN/PROCRIT) injection  4,000 Units Intravenous Q M,W,F-HD   heparin injection (subcutaneous)  5,000  Units Subcutaneous Q8H   insulin aspart  0-5 Units Subcutaneous QHS   insulin aspart  0-9 Units Subcutaneous TID WC   lidocaine  1 patch Transdermal Q24H   mouth rinse  15 mL Mouth Rinse 10 times per day   midodrine  10 mg Oral TID WC   pantoprazole  40 mg Oral Daily   sodium chloride flush  10-40 mL Intracatheter Q12H   traZODone  50 mg Oral QHS   Continuous Infusions:   ceFAZolin (ANCEF) IV Stopped (12/16/20 0109)   dextrose 5 % and 0.9% NaCl 60 mL/hr at 12/16/20 1516   levETIRAcetam Stopped (12/16/20 1516)   And   levETIRAcetam Stopped (12/16/20 1455)     LOS: 6 days    Enzo Bi, MD [facility] Pager [phone]  If 7PM-7AM, please contact night-coverage 12/16/2020, 7:43 PM

## 2020-12-16 NOTE — Progress Notes (Signed)
Speech Language Pathology Treatment: Dysphagia  Patient Details Name: Jamie Benitez MRN: [id number] DOB: 02/10/1946 Today's Date: 12/16/2020 Time: 8413-2440 SLP Time Calculation (min) (ACUTE ONLY): 40 min  Assessment / Plan / Recommendation Clinical Impression  Pt seen for ongoing assessment of swallowing; toleration of recently upgraded diet to thin liquids. She remains on puree foods; edentulous and has declined attention to task -- easily distracted and requires much cueing. She is verbally responsive and engaged to answer questions w/ SLP. She is on 3L o2. Pt appears to have declined Pulmonary status at Baseline w/ mildly effortful, gravely phonations w/ any exertion including talking, moving in bed. Pt endorsed being SOB "all the time now".  Pt explained general aspiration precautions and agreed verbally to the need for following them especially sitting upright for all oral intake -- supported behind the back more for full upright sitting. Pt often c/o discomfort and did not want to sit up for po intake -- much encouragement needed. She was then given oral care and po trials. She required encouragement and hand over hand to help Hold Cup to feed self -- something she endorsed doing at home prior. She consumed trials of thin and nectar liquids w/ an inconsistent, mild Delayed hack of a cough; No other overt clinical s/s of aspiration were noted: respiratory status remained fairly calm and unlabored at her Baseline(as prior to po's), vocal quality clear b/t trials. Pt was also given Pills Whole in Puree (w/ nsg present) which she tolerated well; the puree providing cohesion for swallowing tablets. Oral phase appeared grossly [facility] for bolus management and timely A-P transfer for swallowing; oral clearing achieved w/ all consistencies. NSG denied any reports of swallowing deficits since the upgrade of pt's diet(yesterday?) as well.   Pt appears at reduced risk for aspriation when following aspiration  precautions including thin liquids VIA CUP. Though aspiration risk remains d/t her declined medical and Pulmonary status' in general. Eating in bed is not helpful for Upright positioning as well, and she requires much cueing and support by Staff for sitting Upright.   Recommend continue a Puree foods diet(dys. Level 1) w/ gravies added to moisten foods conservation of energy; Thin liquids VIA CUP ONLY -- pt can Hold Cup to drink w/ min support. Recommend aspiration precautions; Pills Whole in Puree; tray setup and positioning assistance for meals. REFLUX precautions. ST services will continue to monitor. NSG updated. Precautions posted at bedside.      HPI HPI: Per admitting H&P "Jamie Benitez is a 75 y.o. female with medical history significant of sCHF with EF 35%, CAD with stent placement, CKD-3A, chronic back pain, Bell's palsy, HTN, hyperlipidemia, diabetes mellitus, GERD, depression with anxiety, who presents with weakness.     Patient states that she has generalized weakness for more than 4 days.  No unilateral numbness or tingling to extremities.  No facial droop or slurred speech.  Patient has dry cough, denies chest pain or shortness breath.  No fever or chills.  Patient states that she had diarrhea in the past several days, which has resolved.  Currently no nausea, vomiting, diarrhea or abdominal pain.  No symptoms of UTI.  Patient complains of chronic lower back pain. Initially patient had oxygen desaturation to mid 80s, which improved to 98% on room air in ED.     Initial trop 105 with ST depression and T wave inversion in inferior leads and anterior leads.  Dr. Okey Dupre of cardiology was consulted, who did not think patient had  STEMI. He recommended [facility] cardiology consult. IV heparin is started in ED"      SLP Plan  Continue with current plan of care      Recommendations for follow up therapy are one component of a multi-disciplinary discharge planning process, led by the attending physician.   Recommendations may be updated based on patient status, additional functional criteria and insurance authorization.    Recommendations  Diet recommendations: Dysphagia 1 (puree);Thin liquid Liquids provided via: Cup;No straw Medication Administration: Whole meds with puree Supervision: Patient able to self feed;Staff to assist with self feeding;Full supervision/cueing for compensatory strategies Compensations: Minimize environmental distractions;Slow rate;Small sips/bites;Lingual sweep for clearance of pocketing;Follow solids with liquid;Multiple dry swallows after each bite/sip Postural Changes and/or Swallow Maneuvers: Out of bed for meals;Seated upright 90 degrees;Upright 30-60 min after meal                General recommendations:  (Dietician f/u; Palliative care f/u for GOC) Oral Care Recommendations: Oral care BID;Oral care before and after PO;Staff/trained caregiver to provide oral care Follow up Recommendations: Skilled Nursing facility SLP Visit Diagnosis: Dysphagia, oropharyngeal phase (R13.12) Plan: Continue with current plan of care       GO                  Jerilynn Som, MS, CCC-SLP Speech Language Pathologist Rehab Services [phone] [facility]  12/16/2020, 10:46 AM

## 2020-12-16 NOTE — TOC Initial Note (Addendum)
Transition of Care [facility]) - Initial/Assessment Note    Patient Details  Name: Jamie Benitez MRN: [id number] Date of Birth: 1945-07-26  Transition of Care [facility]) CM/SW Contact:    Gildardo Griffes, LCSW Phone Number: 12/16/2020, 8:24 AM  Clinical Narrative:                  Update: patient's daughter returned call . CSW ensured she's fully informed about the PT/OT recs for SNF but also told her I see where palliative was involved. she expressed not likeing to see her mother in pain, seems like she may want comfort measures soon.   I have reached out to palliative to continue discussion tomorrow for appropriate dispo plan.   CSW called patient's daughter Jamie Benitez as patient is not oriented. Daughter Jamie Benitez has been called to discuss the plan for patient, as CSW notes that PT/OT rec is SNF however palliative is involved as well. CSW unsure of family's wishes for dispo at this time.   Jamie Benitez answered however reports she is at work and will call CSW back.   TOC will continue to follow to determine dispo.   Expected Discharge Plan:  (To be determined)     Patient Goals and CMS Choice   CMS Medicare.gov Compare Post Acute Care list provided to:: Patient Represenative (must comment) Jamie Benitez) Choice offered to / list presented to : Adult Children  Expected Discharge Plan and Services Expected Discharge Plan:  (To be determined)                                              Prior Living Arrangements/Services   Lives with:: Self                   Activities of Daily Living   ADL Screening (condition at time of admission) Patient's cognitive ability adequate to safely complete daily activities?: No Is the patient deaf or have difficulty hearing?: No Does the patient have difficulty seeing, even when wearing glasses/contacts?: Yes Does the patient have difficulty concentrating, remembering, or making decisions?: No Patient able to express need for assistance with ADLs?:  Yes Does the patient have difficulty dressing or bathing?: Yes Independently performs ADLs?: No Communication: Independent Dressing (OT): Needs assistance Is this a change from baseline?: Pre-admission baseline Grooming: Needs assistance, Independent Is this a change from baseline?: Pre-admission baseline Feeding: Needs assistance Is this a change from baseline?: Change from baseline, expected to last <3 days Does the patient have difficulty walking or climbing stairs?: Yes Weakness of Legs: Both Weakness of Arms/Hands: Both  Permission Sought/Granted                  Emotional Assessment       Orientation: : Oriented to Self Alcohol / Substance Use: Not Applicable Psych Involvement: No (comment)  Admission diagnosis:  Elevated troponin level [R77.8] Positive D-dimer [R79.89] Demand ischemia (HCC) [I24.8] NSTEMI (non-ST elevated myocardial infarction) (HCC) [I21.4] Generalized weakness [R53.1] AKI (acute kidney injury) (HCC) [N17.9] Acute decompensated heart failure (HCC) [I50.9] Acute renal failure, unspecified acute renal failure type [facility]) [N17.9] Patient Active Problem List   Diagnosis Date Noted   Generalized weakness    DNR (do not resuscitate)    Palliative care by specialist    Pressure injury of skin 12/13/2020   Acute decompensated heart failure (HCC) 12/10/2020   NSTEMI (non-ST elevated  myocardial infarction) (HCC) 12/09/2020   Chronic systolic CHF (congestive heart failure) (HCC) 12/09/2020   Chronic back pain    Acute renal failure (HCC)    Depression with anxiety    Type II diabetes mellitus with renal manifestations (HCC)    CAD (coronary artery disease)    HLD (hyperlipidemia)    HTN (hypertension)    Hypokalemia    Leukocytosis    PCP:  Kandyce Rud, MD Pharmacy:   [id number] [place], [place] - [address] AT [facility] [address] [place] [address] Phone: [phone] Fax:  [phone]     Social Determinants of Health (SDOH) Interventions    Readmission Risk Interventions No flowsheet data found.

## 2020-12-17 ENCOUNTER — Other Ambulatory Visit (INDEPENDENT_AMBULATORY_CARE_PROVIDER_SITE_OTHER): Payer: Self-pay | Admitting: Vascular Surgery

## 2020-12-17 DIAGNOSIS — L8990 Pressure ulcer of unspecified site, unspecified stage: Secondary | ICD-10-CM | POA: Diagnosis present

## 2020-12-17 DIAGNOSIS — Z7189 Other specified counseling: Secondary | ICD-10-CM | POA: Diagnosis not present

## 2020-12-17 DIAGNOSIS — N179 Acute kidney failure, unspecified: Secondary | ICD-10-CM | POA: Diagnosis not present

## 2020-12-17 DIAGNOSIS — R7881 Bacteremia: Secondary | ICD-10-CM | POA: Diagnosis not present

## 2020-12-17 DIAGNOSIS — I248 Other forms of acute ischemic heart disease: Secondary | ICD-10-CM | POA: Diagnosis not present

## 2020-12-17 DIAGNOSIS — I214 Non-ST elevation (NSTEMI) myocardial infarction: Secondary | ICD-10-CM | POA: Diagnosis not present

## 2020-12-17 LAB — BASIC METABOLIC PANEL
Anion gap: 10 (ref 5–15)
BUN: 41 mg/dL — ABNORMAL HIGH (ref 8–23)
CO2: 28 mmol/L (ref 22–32)
Calcium: 7.8 mg/dL — ABNORMAL LOW (ref 8.9–10.3)
Chloride: 97 mmol/L — ABNORMAL LOW (ref 98–111)
Creatinine, Ser: 3.54 mg/dL — ABNORMAL HIGH (ref 0.44–1.00)
GFR, Estimated: 13 mL/min — ABNORMAL LOW (ref >60)
Glucose, Bld: 145 mg/dL — ABNORMAL HIGH (ref 70–99)
Potassium: 3.4 mmol/L — ABNORMAL LOW (ref 3.5–5.1)
Sodium: 135 mmol/L (ref 135–145)

## 2020-12-17 LAB — CBC
HCT: 23 % — ABNORMAL LOW (ref 36.0–46.0)
Hemoglobin: 8.3 g/dL — ABNORMAL LOW (ref 12.0–15.0)
MCH: 27.4 pg (ref 26.0–34.0)
MCHC: 36.1 g/dL — ABNORMAL HIGH (ref 30.0–36.0)
MCV: 75.9 fL — ABNORMAL LOW (ref 80.0–100.0)
Platelets: 232 10*3/uL (ref 150–400)
RBC: 3.03 MIL/uL — ABNORMAL LOW (ref 3.87–5.11)
RDW: 17 % — ABNORMAL HIGH (ref 11.5–15.5)
WBC: 15.8 10*3/uL — ABNORMAL HIGH (ref 4.0–10.5)
nRBC: 0 % (ref 0.0–0.2)

## 2020-12-17 LAB — MAGNESIUM: Magnesium: 1.9 mg/dL (ref 1.7–2.4)

## 2020-12-17 LAB — GLUCOSE, CAPILLARY
Glucose-Capillary: 152 mg/dL — ABNORMAL HIGH (ref 70–99)
Glucose-Capillary: 175 mg/dL — ABNORMAL HIGH (ref 70–99)

## 2020-12-17 MED ORDER — MORPHINE SULFATE (PF) 2 MG/ML IV SOLN
1.0000 mg | INTRAVENOUS | Status: DC | PRN
Start: 2020-12-17 — End: 2020-12-18
  Administered 2020-12-17 (×2): 1 mg via INTRAVENOUS
  Filled 2020-12-17 (×2): qty 1

## 2020-12-17 MED ORDER — LEVETIRACETAM IN NACL 500 MG/100ML IV SOLN: 500.0000 mg | INTRAVENOUS | Status: DC

## 2020-12-17 MED ORDER — SODIUM CHLORIDE 0.9 % IV SOLN: 250.0000 mg | INTRAVENOUS | Status: DC

## 2020-12-17 NOTE — Progress Notes (Signed)
[facility] Daily Progress Note  12/13/20: Insertion of Non-tunneled Central Venous Catheter (Antezana)  Subjective: Patient lying in bed this AM.  In no acute distress.  Lethargic.  No acute issues overnight.  Objective: Vitals:   12/16/20 1935 12/17/20 0025 12/17/20 0455 12/17/20 0730  BP: (!) 147/106 108/60 (!) 113/50 131/72  Pulse: 72 66 78 93  Resp: 19 20 17 17   Temp: 98.5 F (36.9 C) 99.1 F (37.3 C) 98.3 F (36.8 C) 98.3 F (36.8 C)  TempSrc:    Axillary  SpO2:  92% 96% 96%  Weight:      Height:        Intake/Output Summary (Last 24 hours) at 12/17/2020 1013 Last data filed at 12/17/2020 0441 Gross per 24 hour  Intake 963.11 ml  Output 721 ml  Net 242.11 ml   Physical Exam: A&Ox1, NAD, Lethargic CV: RRR Pulmonary: Decreased bilaterally Abdomen: Soft, Nontender, Nondistended Vascular: Extremities are warm distally to toes.  Moderate edema.  Soft.   Laboratory: CBC    Component Value Date/Time   WBC 15.8 (H) 12/17/2020 0700   HGB 8.3 (L) 12/17/2020 0700   HGB 10.8 (L) 07/06/2012 1924   HCT 23.0 (L) 12/17/2020 0700   HCT 33.5 (L) 07/06/2012 1924   PLT 232 12/17/2020 0700   PLT 592 (H) 07/06/2012 1924   BMET    Component Value Date/Time   NA 135 12/17/2020 0700   NA 140 07/08/2012 0443   K 3.4 (L) 12/17/2020 0700   K 3.7 07/08/2012 0443   CL 97 (L) 12/17/2020 0700   CL 106 07/08/2012 0443   CO2 28 12/17/2020 0700   CO2 27 07/08/2012 0443   GLUCOSE 145 (H) 12/17/2020 0700   GLUCOSE 102 (H) 07/08/2012 0443   BUN 41 (H) 12/17/2020 0700   BUN 12 07/08/2012 0443   CREATININE 3.54 (H) 12/17/2020 0700   CREATININE 0.77 07/08/2012 0443   CALCIUM 7.8 (L) 12/17/2020 0700   CALCIUM 8.6 07/08/2012 0443   GFRNONAA 13 (L) 12/17/2020 0700   GFRNONAA >60 07/08/2012 0443   GFRAA >60 07/08/2012 0443   Assessment/Planning: The patient is a 75 year old female with multiple medical issues including progressively worsening chronic kidney  disease now requiring hemodialysis  1) patient is currently being maintained by a temporary dialysis catheter via hemodialysis. 2) we have been asked by the nephrology service to transition her temporary catheter to a PermCath to allow her to dialyze in the outpatient setting 3) procedure, risks and benefits were explained to the patient.  All questions were answered.  Patient wishes to proceed however I am unsure if she is able to sign her own consents.  I have reached out to the bedside nurse for more information. 4) we will plan on PermCath insertion tomorrow once I am able to obtain consent from either the patient or a POA.  Discussed with Dr. 65 Andersyn Fragoso PA-C 12/17/2020 10:13 AM

## 2020-12-17 NOTE — Progress Notes (Signed)
PROGRESS NOTE    Jamie Benitez  TRV:[id number] DOB: 1945-04-22 DOA: 12/09/2020 PCP: Derinda Late, MD    Brief Narrative:  75 y.o. female with medical history significant of sCHF with EF 35%, CAD with stent placement, CKD-3A, chronic back pain, Bell's palsy, HTN, hyperlipidemia, diabetes mellitus, GERD, depression with anxiety, who presents with weakness.   Patient states that she has generalized weakness for more than 4 days.  No unilateral numbness or tingling to extremities.  No facial droop or slurred speech.  Patient has dry cough, denies chest pain or shortness breath.  No fever or chills.  Patient states that she had diarrhea in the past several days, which has resolved.  Currently no nausea, vomiting, diarrhea or abdominal pain.  No symptoms of UTI.  Patient complains of chronic lower back pain. Initially patient had oxygen desaturation to mid 80s, which improved to 98% on room air in ED.   Initial trop 105 with ST depression and T wave inversion in inferior leads and anterior leads.  Dr. Saunders Revel of cardiology was consulted, who did not think patient had STEMI. He recommended [facility] cardiology consult. IV heparin is started in ED  Cardiology patient's presentation is inconsistent with NSTEMI.  No significant delta in troponin.  VQ scan and lower extremity duplex negative for VTE.  Patient did have elements of mild BNP elevation and small left pleural effusion possible decompensated heart failure.  Was not given diuretics due to elevated kidney function.  Subsequently patient went into a tachyarrhythmia likely atrial fibrillation which she does not have a history of.  Also creatinine worsened from 2-4.2.  Nephrology consult 9/22.  9/23: Patient was moved to stepdown unit for closer monitoring due to persistent hypotension.  Not requiring vasopressor support.  Remains on amiodarone.  Remains in atrial fibrillation, rate control improved.  Kidney function continues to deteriorate.  Discussed with  nephrology.  Patient still undecided about initiation of hemodialysis  9/25: Early this morning patient had RRT called for decreased level of mentation and possible seizure activity.  Neurology consult requested.  Stat CT head negative for mass or bleed.  ABG with mild hypoxia but otherwise reassuring.  Neurology ordering phenytoin and routine EEG as well as MRI brain.  Patient remains encephalopathic and unable to provide history.  9/26: Patient little bit more awake today.  Grandson at bedside.  Patient had temporary dialysis line placed in right IJ on 9/25.  To undergo hemodialysis today.   Assessment & Plan:   Principal Problem:   NSTEMI (non-ST elevated myocardial infarction) ([place]) Active Problems:   Chronic systolic CHF (congestive heart failure) (HCC)   Chronic back pain   Acute renal failure (HCC)   Depression with anxiety   Type II diabetes mellitus with renal manifestations (HCC)   CAD (coronary artery disease)   HLD (hyperlipidemia)   HTN (hypertension)   Hypokalemia   Leukocytosis   Acute decompensated heart failure (HCC)   Pressure ulcer   Generalized weakness   DNR (do not resuscitate)   Palliative care by specialist   Pressure injury of skin   Comfort care measures  --decision made by family to pursue comfort care today.  Hospice referral made.  Possible new onset seizure 8 mm acute infarct in left parietal white matter Patient had shaking activity and encephalopathy noted early 9/25 Seizure activity versus uremic encephalopathy primary differentials Neurology consulted CT head negative MRI brain with small acute infarct Plan: --d/c IV Keppra and plavix, due to comfort care status  Acute  renal failure superimposed on stage 3a chronic kidney disease ([place]):  Started on HD Metabolic acidosis  Hyponatremia recent baseline creatinine 1.2 on 10/28/2019.   Her creatinine is at 2.02, BUN 35 on admission Creatinine progressively worsening Dialysis catheter placed  9/25 HD 9/26 Plan: --stop dialysis, due to comfort care status  Possible MSSA bacteremia Unclear what this this represents infection or true contaminant Does not meet sepsis criteria No clear source identified TTE negative MRI spine negative Plan: Continue Ancef --will not obtain TEE due to comfort care status  NSTEMI, ruled out history of CAD: S/p of stent placement  trop  105 --> 93 --> 93.   Patient does not have chest pain, but has generalized weakness.   Dr. Clayborn Bigness of cardiology is consulted.   D-dimer is positive, but VQ scan negative for PE.   Lower extremity Dopplers negative for DVT. Per cardiology patient's presentation is inconsistent with NSTEMI Plan: --d/c plavix due to comfort care status  New onset atrial fibrillation with rapid ventricular response Noted on telemetry 9/22 Ventricular rates up to 130s Plan: --cont amiodarone 200 mg BID  Chronic systolic CHF (congestive heart failure) ([place]) 2D echo on 05/22/2017 showed EF of 35.  Patient has 1+ leg edema, elevated BNP 398, indicating possible fluid overload.   Patient has progressively deteriorating kidney function.     Chronic back pain --oxycodone PRN --IV morphine PRN  Depression with anxiety --IV Ativan   Type II diabetes mellitus with renal manifestations (HCC) Recent A1c 5.8, well controlled.   Patient is taking 70/30 NPH insulin and glucose at home. --has not been receiving 70/30 or many units of SSI, and BG have been mostly within inpatient goal. --d/c'ed BG checks and SSI   HLD (hyperlipidemia) --d/c statin due to comfort care status   HTN (hypertension) --BP varied widely --Hold BP meds for now   Hypokalemia --no more monitoring  Possible bile duct dilatation Noted partial on MRI LFTs normal   DVT prophylaxis: none, due to comfort care status Code Status: DNR Family Communication:   Disposition Plan: Status is: Inpatient  Dispo: The patient is from: Home               Anticipated d/c is to: hospice facility              Patient currently ready for discharge.    Difficult to place patient No    Level of care: Progressive Cardiac  Consultants:  Nephrology Cardiology  Procedures:  None  Antimicrobials:  Cefazolin   Subjective: After discussing with palliative care provider, family decided to proceed with comfort care.   Pt has been sleeping most of the day.  Per RN, pt expressed that she didn't want to continue dialysis.     Objective: Vitals:   12/17/20 0025 12/17/20 0455 12/17/20 0730 12/17/20 1138  BP: 108/60 (!) 113/50 131/72 (!) 125/96  Pulse: 66 78 93 87  Resp: 20 17 17 17   Temp: 99.1 F (37.3 C) 98.3 F (36.8 C) 98.3 F (36.8 C) 97.9 F (36.6 C)  TempSrc:   Axillary   SpO2: 92% 96% 96% 98%  Weight:      Height:        Intake/Output Summary (Last 24 hours) at 12/17/2020 1618 Last data filed at 12/17/2020 1500 Gross per 24 hour  Intake 358.67 ml  Output --  Net 358.67 ml   Filed Weights   12/09/20 0354 12/13/20 0503  Weight: 102.1 kg 98.3 kg    Examination:  Constitutional: NAD, sleeping HEENT: conjunctivae and lids normal, EOMI CV: No cyanosis.   RESP: normal respiratory effort Extremities: swelling in all extremities  SKIN: warm, dry, extensive bruising    Data Reviewed: I have personally reviewed following labs and imaging studies  CBC: Recent Labs  Lab 12/12/20 1158 12/13/20 0730 12/14/20 0655 12/15/20 0620 12/17/20 0700  WBC 16.0* 17.6* 12.4* 10.5 15.8*  NEUTROABS 14.1*  --   --   --   --   HGB 9.8* 10.0* 8.7* 8.4* 8.3*  HCT 27.1* 28.6* 23.6* 22.6* 23.0*  MCV 77.4* 79.9* 74.4* 74.8* 75.9*  PLT 173 155 185 193 532   Basic Metabolic Panel: Recent Labs  Lab 12/13/20 0730 12/14/20 0655 12/15/20 0620 12/16/20 0441 12/17/20 0700  NA 128* 131* 133* 133* 135  K 3.7 3.7 3.3* 4.1 3.4*  CL 86* 90* 92* 98 97*  CO2 19* 26 27 22 28   GLUCOSE 143* 121* 127* 118* 145*  BUN 80* 89* 66* 56* 41*   CREATININE 6.07* 6.24* 5.41* 4.39* 3.54*  CALCIUM 6.3* 6.5* 7.0* 7.2* 7.8*  MG  --   --   --   --  1.9  PHOS  --   --  4.9*  --   --    GFR: Estimated Creatinine Clearance: 14.9 mL/min (A) (by C-G formula based on SCr of 3.54 mg/dL (H)). Liver Function Tests: Recent Labs  Lab 12/11/20 0627 12/11/20 1815 12/12/20 1158 12/15/20 1252  AST 76* 101* 101* 34  ALT 13 11 7  <5  ALKPHOS 82 93 110 115  BILITOT 0.7 0.7 0.7 0.9  PROT 5.3* 4.2* 4.8* 4.8*  ALBUMIN 2.3*  2.4* 1.8* 2.0* 1.7*   No results for input(s): LIPASE, AMYLASE in the last 168 hours.  No results for input(s): AMMONIA in the last 168 hours. Coagulation Profile: No results for input(s): INR, PROTIME in the last 168 hours.  Cardiac Enzymes: Recent Labs  Lab 12/15/20 1252  CKTOTAL 437*   BNP (last 3 results) No results for input(s): PROBNP in the last 8760 hours. HbA1C: No results for input(s): HGBA1C in the last 72 hours.  CBG: Recent Labs  Lab 12/16/20 1351 12/16/20 1559 12/16/20 2036 12/17/20 0730 12/17/20 1138  GLUCAP 78 204* 120* 152* 175*   Lipid Profile: No results for input(s): CHOL, HDL, LDLCALC, TRIG, CHOLHDL, LDLDIRECT in the last 72 hours.  Thyroid Function Tests: No results for input(s): TSH, T4TOTAL, FREET4, T3FREE, THYROIDAB in the last 72 hours.  Anemia Panel: No results for input(s): VITAMINB12, FOLATE, FERRITIN, TIBC, IRON, RETICCTPCT in the last 72 hours. Sepsis Labs: Recent Labs  Lab 12/11/20 9924 12/11/20 0832 12/11/20 1040  LATICACIDVEN 1.2 1.3 1.7    Recent Results (from the past 240 hour(s))  Resp Panel by RT-PCR (Flu A&B, Covid) Nasopharyngeal Swab     Status: None   Collection Time: 12/09/20  4:34 AM   Specimen: Nasopharyngeal Swab; Nasopharyngeal(NP) swabs in vial transport medium  Result Value Ref Range Status   SARS Coronavirus 2 by RT PCR NEGATIVE NEGATIVE Final    Comment: (NOTE) SARS-CoV-2 target nucleic acids are NOT DETECTED.  The SARS-CoV-2 RNA is  generally detectable in upper respiratory specimens during the acute phase of infection. The lowest concentration of SARS-CoV-2 viral copies this assay can detect is 138 copies/mL. A negative result does not preclude SARS-Cov-2 infection and should not be used as the sole basis for treatment or other patient management decisions. A negative result may occur with  improper specimen collection/handling, submission of  specimen other than nasopharyngeal swab, presence of viral mutation(s) within the areas targeted by this assay, and inadequate number of viral copies(<138 copies/mL). A negative result must be combined with clinical observations, patient history, and epidemiological information. The expected result is Negative.  Fact Sheet for Patients:  EntrepreneurPulse.com.au  Fact Sheet for Healthcare Providers:  IncredibleEmployment.be  This test is no t yet approved or cleared by the [place] FDA and  has been authorized for detection and/or diagnosis of SARS-CoV-2 by FDA under an Emergency Use Authorization (EUA). This EUA will remain  in effect (meaning this test can be used) for the duration of the COVID-19 declaration under Section 564(b)(1) of the Act, 21 U.S.C.section 360bbb-3(b)(1), unless the authorization is terminated  or revoked sooner.       Influenza A by PCR NEGATIVE NEGATIVE Final   Influenza B by PCR NEGATIVE NEGATIVE Final    Comment: (NOTE) The Xpert Xpress SARS-CoV-2/FLU/RSV plus assay is intended as an aid in the diagnosis of influenza from Nasopharyngeal swab specimens and should not be used as a sole basis for treatment. Nasal washings and aspirates are unacceptable for Xpert Xpress SARS-CoV-2/FLU/RSV testing.  Fact Sheet for Patients: EntrepreneurPulse.com.au  Fact Sheet for Healthcare Providers: IncredibleEmployment.be  This test is not yet approved or cleared by the [place] FDA and has been authorized for detection and/or diagnosis of SARS-CoV-2 by FDA under an Emergency Use Authorization (EUA). This EUA will remain in effect (meaning this test can be used) for the duration of the COVID-19 declaration under Section 564(b)(1) of the Act, 21 U.S.C. section 360bbb-3(b)(1), unless the authorization is terminated or revoked.  Performed at [facility], [address] [place], [place] [address]   Blood culture (routine single)     Status: Abnormal   Collection Time: 12/09/20  4:34 AM   Specimen: BLOOD  Result Value Ref Range Status   Specimen Description   Final    BLOOD LEFT ARM Performed at [facility], [address] [place], [place] [address]    Special Requests   Final    BOTTLES DRAWN AEROBIC AND ANAEROBIC Blood Culture adequate volume Performed at [address] [place], [place] [address]    Culture  Setup Time   Final    GRAM POSITIVE COCCI IN BOTH AEROBIC AND ANAEROBIC BOTTLES CRITICAL RESULT CALLED TO, READ BACK BY AND VERIFIED WITH: Aldona Bar RAEUR AT 0037 ON 12/09/20 BY SS Performed at [facility], [address], [place],  [address]    Culture STAPHYLOCOCCUS AUREUS (A)  Final   Report Status 12/11/2020 FINAL  Final   Organism ID, Bacteria STAPHYLOCOCCUS AUREUS  Final      Susceptibility   Staphylococcus aureus - MIC*    CIPROFLOXACIN 1 SENSITIVE Sensitive     ERYTHROMYCIN <=0.25 SENSITIVE Sensitive     GENTAMICIN <=0.5 SENSITIVE Sensitive     OXACILLIN 0.5 SENSITIVE Sensitive     TETRACYCLINE <=1 SENSITIVE Sensitive     VANCOMYCIN <=0.5 SENSITIVE Sensitive     TRIMETH/SULFA <=10 SENSITIVE Sensitive     CLINDAMYCIN <=0.25 SENSITIVE Sensitive     RIFAMPIN <=0.5 SENSITIVE Sensitive     Inducible Clindamycin NEGATIVE Sensitive     * STAPHYLOCOCCUS AUREUS  Blood Culture ID Panel (Reflexed)     Status: Abnormal   Collection Time: 12/09/20  4:34 AM  Result Value Ref Range  Status   Enterococcus faecalis NOT DETECTED NOT DETECTED Final   Enterococcus Faecium NOT DETECTED NOT  DETECTED Final   Listeria monocytogenes NOT DETECTED NOT DETECTED Final   Staphylococcus species DETECTED (A) NOT DETECTED Final    Comment: CRITICAL RESULT CALLED TO, READ BACK BY AND VERIFIED WITH: SAMANTHA RAEUR AT 1750 ON 12/09/20 BY SS    Staphylococcus aureus (BCID) DETECTED (A) NOT DETECTED Final    Comment: CRITICAL RESULT CALLED TO, READ BACK BY AND VERIFIED WITH: SAMANTHA RAEUR AT 1448 ON 12/09/20 BY SS    Staphylococcus epidermidis NOT DETECTED NOT DETECTED Final   Staphylococcus lugdunensis NOT DETECTED NOT DETECTED Final   Streptococcus species NOT DETECTED NOT DETECTED Final   Streptococcus agalactiae NOT DETECTED NOT DETECTED Final   Streptococcus pneumoniae NOT DETECTED NOT DETECTED Final   Streptococcus pyogenes NOT DETECTED NOT DETECTED Final   A.calcoaceticus-baumannii NOT DETECTED NOT DETECTED Final   Bacteroides fragilis NOT DETECTED NOT DETECTED Final   Enterobacterales NOT DETECTED NOT DETECTED Final   Enterobacter cloacae complex NOT DETECTED NOT DETECTED Final   Escherichia coli NOT DETECTED NOT DETECTED Final   Klebsiella aerogenes NOT DETECTED NOT DETECTED Final   Klebsiella oxytoca NOT DETECTED NOT DETECTED Final   Klebsiella pneumoniae NOT DETECTED NOT DETECTED Final   Proteus species NOT DETECTED NOT DETECTED Final   Salmonella species NOT DETECTED NOT DETECTED Final   Serratia marcescens NOT DETECTED NOT DETECTED Final   Haemophilus influenzae NOT DETECTED NOT DETECTED Final   Neisseria meningitidis NOT DETECTED NOT DETECTED Final   Pseudomonas aeruginosa NOT DETECTED NOT DETECTED Final   Stenotrophomonas maltophilia NOT DETECTED NOT DETECTED Final   Candida albicans NOT DETECTED NOT DETECTED Final   Candida auris NOT DETECTED NOT DETECTED Final   Candida glabrata NOT DETECTED NOT DETECTED Final   Candida krusei NOT DETECTED NOT DETECTED Final    Candida parapsilosis NOT DETECTED NOT DETECTED Final   Candida tropicalis NOT DETECTED NOT DETECTED Final   Cryptococcus neoformans/gattii NOT DETECTED NOT DETECTED Final   Meth resistant mecA/C and MREJ NOT DETECTED NOT DETECTED Final    Comment: Performed at [facility], [address], [place], [place] [address]  Urine Culture     Status: Abnormal   Collection Time: 12/09/20  9:24 PM   Specimen: In/Out Cath Urine  Result Value Ref Range Status   Specimen Description   Final    IN/OUT CATH URINE Performed at [facility], [address] [place], [place] [address]    Special Requests   Final    NONE Performed at [facility], [address] [place], [place] [address]    Culture MULTIPLE SPECIES PRESENT, SUGGEST RECOLLECTION (A)  Final   Report Status 12/11/2020 FINAL  Final  CULTURE, BLOOD (ROUTINE X 2) w Reflex to ID Panel     Status: None   Collection Time: 12/10/20  4:29 PM   Specimen: BLOOD  Result Value Ref Range Status   Specimen Description BLOOD LEFT ANTECUBITAL  Final   Special Requests   Final    BOTTLES DRAWN AEROBIC ONLY Blood Culture adequate volume   Culture   Final    NO GROWTH 5 DAYS Performed at [facility], [address] [place], [place] [address]    Report Status 12/15/2020 FINAL  Final  CULTURE, BLOOD (ROUTINE X 2) w Reflex to ID Panel     Status: None   Collection Time: 12/10/20  5:19 PM   Specimen: BLOOD  Result Value Ref Range Status   Specimen Description BLOOD BLOOD LEFT FOREARM  Final   Special Requests  Final    BOTTLES DRAWN AEROBIC AND ANAEROBIC Blood Culture adequate volume   Culture   Final    NO GROWTH 5 DAYS Performed at [facility], [address] [place], [place] [address]    Report Status 12/15/2020 FINAL  Final  MRSA Next Gen by PCR, Nasal     Status: None   Collection Time: 12/11/20  4:29 AM   Specimen: Nasal Mucosa; Nasal Swab  Result Value Ref Range Status   MRSA by PCR  Next Gen NOT DETECTED NOT DETECTED Final    Comment: (NOTE) The GeneXpert MRSA Assay (FDA approved for NASAL specimens only), is one component of a comprehensive MRSA colonization surveillance program. It is not intended to diagnose MRSA infection nor to guide or monitor treatment for MRSA infections. Test performance is not FDA approved in patients less than 61 years old. Performed at [facility], [address], [place], [place] [address]          Radiology Studies: No results found.      Scheduled Meds:  amiodarone  200 mg Oral BID   chlorhexidine gluconate (MEDLINE KIT)  15 mL Mouth Rinse BID   Chlorhexidine Gluconate Cloth  6 each Topical Daily   lidocaine  1 patch Transdermal Q24H   mouth rinse  15 mL Mouth Rinse 10 times per day   midodrine  10 mg Oral TID WC   pantoprazole  40 mg Oral Daily   sodium chloride flush  10-40 mL Intracatheter Q12H   traZODone  50 mg Oral QHS   Continuous Infusions:   ceFAZolin (ANCEF) IV Stopped (12/17/20 0024)     LOS: 7 days    Enzo Bi, MD [facility] Pager [phone]  If 7PM-7AM, please contact night-coverage 12/17/2020, 4:18 PM

## 2020-12-17 NOTE — Plan of Care (Signed)
Neurology Plan of Care  Patient is being transitioned to hospice. I do not feel strongly she needs to remain on the keppra, particularly bc she has ESRD and will be stopping dialysis. Neurology will not continue to actively follow, but please re-engage if additional questions arise.   Bing Neighbors, MD [facility] [phone]  If 7pm- 7am, please page neurology on call as listed in AMION.

## 2020-12-17 NOTE — Progress Notes (Addendum)
Daily Progress Note   Patient Name: Jamie Benitez       Date: 12/17/2020 DOB: 11-26-1945  Age: 75 y.o. MRN#: [id number] Attending Physician: Enzo Bi, MD Primary Care Physician: Derinda Late, MD Admit Date: 12/09/2020  Reason for Consultation/Follow-up: Establishing goals of care  Subjective: On entry patient was working with PT; exited room for them to finish therapy.  Returned to bedside. Patient states she is tired and just wants to be comfortable for what time she has left on earth and states she is ready to die. She does not want to continue dialysis.  Spoke with daughter, who states she has been waiting to speak with someone about hospice. I advised of conversation. We discussed her status. She states she understands, and wants her mother to stop suffering and having pain. She states she would like hospice care, but would like her son in agreement.  Spoke with patient's grandson. Discussed her diagnoses and status in detail, and answered questions. He states he does not want his grandmother to suffer, and wants to honor her wishes. He would like hospice care and understands her prognosis is <2 weeks. Discussed end of life care and symptom management. Discussed hospice at their home vs hospice facility. He would like hospice facility placement.  Length of Stay: 7  Current Medications: Scheduled Meds:  . amiodarone  200 mg Oral BID  . calcitRIOL  0.25 mcg Oral Daily  . calcium carbonate  1,000 mg of elemental calcium Oral BID WC  . chlorhexidine gluconate (MEDLINE KIT)  15 mL Mouth Rinse BID  . Chlorhexidine Gluconate Cloth  6 each Topical Daily  . clopidogrel  75 mg Oral Daily  . epoetin (EPOGEN/PROCRIT) injection  4,000 Units Intravenous Q M,W,F-HD  . heparin injection  (subcutaneous)  5,000 Units Subcutaneous Q8H  . lidocaine  1 patch Transdermal Q24H  . mouth rinse  15 mL Mouth Rinse 10 times per day  . midodrine  10 mg Oral TID WC  . pantoprazole  40 mg Oral Daily  . sodium chloride flush  10-40 mL Intracatheter Q12H  . traZODone  50 mg Oral QHS    Continuous Infusions: .  ceFAZolin (ANCEF) IV Stopped (12/17/20 0024)  . levETIRAcetam 500 mg (12/17/20 0831)   And  . levETIRAcetam Stopped (12/16/20 1455)    PRN Meds:  acetaminophen, albuterol, levETIRAcetam **AND** levETIRAcetam, LORazepam, methocarbamol, metoprolol tartrate, mupirocin ointment, oxyCODONE, sodium chloride flush  Physical Exam Pulmonary:     Effort: Pulmonary effort is normal.  Neurological:     Mental Status: She is alert.            Vital Signs: BP 131/72 (BP Location: Right Arm)   Pulse 93   Temp 98.3 F (36.8 C) (Axillary)   Resp 17   Ht 5' (1.524 m)   Wt 98.3 kg   SpO2 96%   BMI 42.34 kg/m  SpO2: SpO2: 96 % O2 Device: O2 Device: Nasal Cannula O2 Flow Rate: O2 Flow Rate (L/min): 3 L/min  Intake/output summary:  Intake/Output Summary (Last 24 hours) at 12/17/2020 1047 Last data filed at 12/17/2020 0441 Gross per 24 hour  Intake 963.11 ml  Output 721 ml  Net 242.11 ml   LBM: Last BM Date: 12/16/20 Baseline Weight: Weight: 102.1 kg Most recent weight: Weight: 98.3 kg          Patient Active Problem List   Diagnosis Date Noted  . Generalized weakness   . DNR (do not resuscitate)   . Palliative care by specialist   . Pressure ulcer 12/13/2020  . Acute decompensated heart failure ([place]) 12/10/2020  . NSTEMI (non-ST elevated myocardial infarction) ([place]) 12/09/2020  . Chronic systolic CHF (congestive heart failure) ([place]) 12/09/2020  . Chronic back pain   . Acute renal failure ([place])   . Depression with anxiety   . Type II diabetes mellitus with renal manifestations ([place])   . CAD (coronary artery disease)   . HLD (hyperlipidemia)   . HTN (hypertension)    . Hypokalemia   . Leukocytosis     Palliative Care Assessment & Plan   Recommendations/Plan: Recommend hospice facility placement.  Would continue Oxycodone q 4 hours PRN, and would provide this as needed for pain.    Code Status:    Code Status Orders  (From admission, onward)           Start     Ordered   12/09/20 1338  Do not attempt resuscitation (DNR)  Continuous        12/09/20 1337           Code Status History     Date Active Date Inactive Code Status Order ID Comments User Context   12/09/2020 0757 12/09/2020 1337 Full Code [id number]  Ivor Costa, MD ED       Prognosis:  < 2 weeks    Care plan was discussed with medical teams via epic group chat.   Thank you for allowing the Palliative Medicine Team to assist in the care of this patient.   Time In: 10:50 Time Out: 12:00 Total Time 70 min Prolonged Time Billed  yes       Greater than 50%  of this time was spent counseling and coordinating care related to the above assessment and plan.  Asencion Gowda, NP  Please contact Palliative Medicine Team phone at [phone] for questions and concerns.

## 2020-12-17 NOTE — Progress Notes (Signed)
[facility]  PROGRESS NOTE   Subjective:   Patient seen sitting up in bed Eating ice cream with OT States she is tired    Objective:  Vital signs in last 24 hours:  Temp:  [97.8 F (36.6 C)-99.1 F (37.3 C)] 97.9 F (36.6 C) (09/29 1138) Pulse Rate:  [66-96] 87 (09/29 1138) Resp:  [16-22] 17 (09/29 1138) BP: (108-170)/(50-106) 125/96 (09/29 1138) SpO2:  [92 %-98 %] 98 % (09/29 1138)  Weight change:  Filed Weights   12/09/20 0354 12/13/20 0503  Weight: 102.1 kg 98.3 kg    Intake/Output: I/O last 3 completed shifts: In: 1107.2 [P.O.:630; I.V.:236.6; IV Piggyback:240.6] Out: 871 [Urine:450; Other:421]   Intake/Output this shift:  No intake/output data recorded.  Physical Exam: General:  No acute distress  Head:  Normocephalic, atraumatic. Moist oral mucosal membranes  Eyes:  Anicteric  Lungs:   Clear to auscultation, normal effort  Heart:  S1S2 no rubs, irregular   Abdomen:   Soft, nontender, bowel sounds present  Extremities:  no peripheral edema.  Neurologic:  Awake, alert, following commands  Skin:  No lesions  Access: Rt IJ temp cath    Basic Metabolic Panel: Recent Labs  Lab 12/13/20 0730 12/14/20 0655 12/15/20 0620 12/16/20 0441 12/17/20 0700  NA 128* 131* 133* 133* 135  K 3.7 3.7 3.3* 4.1 3.4*  CL 86* 90* 92* 98 97*  CO2 19* 26 27 22 28   GLUCOSE 143* 121* 127* 118* 145*  BUN 80* 89* 66* 56* 41*  CREATININE 6.07* 6.24* 5.41* 4.39* 3.54*  CALCIUM 6.3* 6.5* 7.0* 7.2* 7.8*  MG  --   --   --   --  1.9  PHOS  --   --  4.9*  --   --      CBC: Recent Labs  Lab 12/12/20 1158 12/13/20 0730 12/14/20 0655 12/15/20 0620 12/17/20 0700  WBC 16.0* 17.6* 12.4* 10.5 15.8*  NEUTROABS 14.1*  --   --   --   --   HGB 9.8* 10.0* 8.7* 8.4* 8.3*  HCT 27.1* 28.6* 23.6* 22.6* 23.0*  MCV 77.4* 79.9* 74.4* 74.8* 75.9*  PLT 173 155 185 193 232      Urinalysis: No results for input(s): COLORURINE, LABSPEC, PHURINE, GLUCOSEU, HGBUR,  BILIRUBINUR, KETONESUR, PROTEINUR, UROBILINOGEN, NITRITE, LEUKOCYTESUR in the last 72 hours.  Invalid input(s): APPERANCEUR    Imaging: US Carotid Bilateral  Result Date: 12/15/2020 CLINICAL DATA:  75 year old female with a history of stroke EXAM: BILATERAL CAROTID DUPLEX ULTRASOUND TECHNIQUE: Pearline Cables scale imaging, color Doppler and duplex ultrasound were performed of bilateral carotid and vertebral arteries in the neck. COMPARISON:  None. FINDINGS: Criteria: Quantification of carotid stenosis is based on velocity parameters that correlate the residual internal carotid diameter with NASCET-based stenosis levels, using the diameter of the distal internal carotid lumen as the denominator for stenosis measurement. The following velocity measurements were obtained: RIGHT ICA:  Systolic 86 cm/sec, Diastolic 17 cm/sec CCA:  82 cm/sec SYSTOLIC ICA/CCA RATIO:  1.1 ECA:  74 cm/sec LEFT ICA:  Systolic 83 cm/sec, Diastolic 25 cm/sec CCA:  637 cm/sec SYSTOLIC ICA/CCA RATIO:  0.8 ECA:  69 cm/sec Right Brachial SBP: Not acquired Left Brachial SBP: Not acquired RIGHT CAROTID ARTERY: No significant calcifications of the right common carotid artery. Intermediate waveform maintained. Moderate heterogeneous and partially calcified plaque at the right carotid bifurcation. No significant lumen shadowing. Low resistance waveform of the right ICA. No significant tortuosity. RIGHT VERTEBRAL ARTERY: Antegrade flow with low resistance waveform. LEFT  CAROTID ARTERY: No significant calcifications of the left common carotid artery. Intermediate waveform maintained. Moderate heterogeneous and partially calcified plaque at the left carotid bifurcation. No significant lumen shadowing. Low resistance waveform of the left ICA. No significant tortuosity. LEFT VERTEBRAL ARTERY:  Antegrade flow with low resistance waveform. IMPRESSION: Color duplex indicates moderate heterogeneous and calcified plaque, with no hemodynamically significant stenosis  by duplex criteria in the extracranial cerebrovascular circulation. Signed, Dulcy Fanny. Dellia Nims, RPVI [facility] Electronically Signed   By: Corrie Mckusick D.O.   On: 12/15/2020 16:15     Medications:     ceFAZolin (ANCEF) IV Stopped (12/17/20 0024)   [START ON 12/18/2020] levETIRAcetam     And   [START ON 12/18/2020] levETIRAcetam      amiodarone  200 mg Oral BID   calcitRIOL  0.25 mcg Oral Daily   calcium carbonate  1,000 mg of elemental calcium Oral BID WC   chlorhexidine gluconate (MEDLINE KIT)  15 mL Mouth Rinse BID   Chlorhexidine Gluconate Cloth  6 each Topical Daily   clopidogrel  75 mg Oral Daily   epoetin (EPOGEN/PROCRIT) injection  4,000 Units Intravenous Q M,W,F-HD   heparin injection (subcutaneous)  5,000 Units Subcutaneous Q8H   lidocaine  1 patch Transdermal Q24H   mouth rinse  15 mL Mouth Rinse 10 times per day   midodrine  10 mg Oral TID WC   pantoprazole  40 mg Oral Daily   sodium chloride flush  10-40 mL Intracatheter Q12H   traZODone  50 mg Oral QHS    Assessment/ Plan:     Principal Problem:   NSTEMI (non-ST elevated myocardial infarction) ([place]) Active Problems:   Chronic systolic CHF (congestive heart failure) (HCC)   Chronic back pain   Acute renal failure ([place])   Depression with anxiety   Type II diabetes mellitus with renal manifestations (HCC)   CAD (coronary artery disease)   HLD (hyperlipidemia)   HTN (hypertension)   Hypokalemia   Leukocytosis   Acute decompensated heart failure (HCC)   Pressure ulcer   Generalized weakness   DNR (do not resuscitate)   Palliative care by specialist   Pressure injury of skin  75 year old white female with a history of hypertension, coronary artery disease, congestive heart failure, diabetes, hyperlipidemia, chronic kidney disease now admitted with history of generalized weakness.  She is found to have Staphylococcus sepsis.  She also has acute kidney  injury on the top of chronic kidney disease.   #1: Acute kidney injury: Patient with acute kidney injury on the top of chronic kidney disease.  She has CKD stage IIIb with a GFR of 44 cc/min previously. On admission, creatinine  2.02 with eGFR 26.  Dialysis believed to be temporary due to AKI. Creatinine continues to improve. Palliative care consulted and patient states she is tired and wants to be made comfortable for the time she has left. We will discontinue dialysis at this time.    #2: Hypokalemia:  likely secondary to bicarbonate infusion.  Potassium 3.1.  #3: Metabolic acidosis: Resolved.    #4: Dehydration/hyponatremia: Sodium improved to 135. Corrected with dialysis   #5: Anemia: likely due to chronic kidney disease.  Hgb 8.3 Continue iron supplementation  #6: Seizure activity: Etiology is unclear.  Patient is being followed by neurology.  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 Also 54m vascular protrusion from right ICA. EEG  on 12/14/20 negative for seizure activity. RN reported seizure on 12/15/20. Keppra scheduled with Ativan available PRN  #7: Hypocalcemia: 7.8 today. Continue calcium carbonate twice daily  Due to recent decision to transition patient to comfort care, we will sign off at this time.     LOS: [facility] 9/29/202212:32 PM

## 2020-12-17 NOTE — Progress Notes (Signed)
Civil engineer, contracting [facility])  Received request from [facility] for family interest in [facility]. Hospice Home notified of request and patient chart is under review. A representative will follow up with [facility] manager and family.   Spoke to daughter Marylene Land who agrees to [employer] at [facility] home of [address] [facility] will follow up once a bed becomes available.   Please do not hesitate to call with questions.    Thank you, Yolande Jolly, BSN, [facility] [phone]

## 2020-12-17 NOTE — Progress Notes (Signed)
Physical Therapy Treatment Patient Details Name: KHAMORA KARAN MRN: [id number] DOB: 10-10-1945 Today's Date: 12/17/2020   History of Present Illness Pt admitted to [facility] on 12/09/20 for c/o progressive generalized weakness and hypoxia, with sat in 80's at home on RA. Significant PMH includes: sCHF (EF 35%), CAD with stent placement, CKD (3a), cLBP, Bell's Palsy, HTN, HLD, diabetes, GERD, depression, anxiety. Elevated troponin likely due to demand ischemia and not MI. Developed tachyarrhythmia with Afib as well as worsening AKI, hypotension, and was transferred to SD/ICU for pressor support, amiodarone gtt, and nephrology consult. Spine MRI negative for spinal abscess. 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    PT Comments    Patient lethargic but arousable and able to follow commands with stimulation. Patient agreeable to PT with maximal encouragement. Total assistance +2 person required for bed mobility at this time. Patient has impaired sitting balance with Total A- Max A required to maintain sitting balance. Sitting tolerance less than 2 minutes and unable to safely attempt standing today. Limited participation overall with mobility efforts. Patient is limited by chronic back pain that is worse during mobility efforts. Once patient was assisted back to bed she asked "Why can't I just die". Discussed with Crystal from Palliative Care.  Limited activity tolerance and participation overall. PT frequency adjusted accordingly. PT will continue to follow in attempts to maximize independence and decrease caregiver burden pending patient/family goals of care remain unchanged.     Recommendations for follow up therapy are one component of a multi-disciplinary discharge planning process, led by the attending physician.  Recommendations may be updated based on patient status, additional functional criteria and insurance authorization.  Follow Up  Recommendations  SNF     Equipment Recommendations  Other (comment) (to be determined at next level of care)    Recommendations for Other Services       Precautions / Restrictions Precautions Precautions: Fall Precaution Comments: temp R IJ for hemodialysis Restrictions Weight Bearing Restrictions: No     Mobility  Bed Mobility Overal bed mobility: Needs Assistance Bed Mobility: Supine to Sit;Sit to Supine Rolling: Total assist   Supine to sit: Total assist;+2 for physical assistance Sit to supine: Total assist;+2 for physical assistance   General bed mobility comments: patient with minimal to no activate participation with bed mobility efforts. maximal encouragement provided for task initiation and participation. patient is limited by chronic back pain that worsens with movement    Transfers                 General transfer comment: unsafe to attempt standing due to poor participation, poor sititng balance, limited activity tolerance overall  Ambulation/Gait                 Stairs             Wheelchair Mobility    Modified Rankin (Stroke Patients Only)       Balance   Sitting-balance support: Feet unsupported;Single extremity supported Sitting balance-Leahy Scale: Zero Sitting balance - Comments: Total assistance initially progressing to Max- Mod A with increased sitting time. heart rate 88-90's with seated activity. total sitting time less than 2 minutes                                    Cognition Arousal/Alertness: Lethargic Behavior During Therapy: WFL for tasks assessed/performed Overall Cognitive Status: Difficult to  assess                                 General Comments:  (patient is able to follow single step commands but falls asleep at rest when not stimulate. overall letharigc during session)      Exercises      General Comments General comments (skin integrity, edema, etc.): After  assisting patient back to bed, she asks "why can't I just die?" Disucssed this with Palliative Care after end of session. PT frequency adjusted based on participation, current medical issues ongoing, hemodialysis.      Pertinent Vitals/Pain Pain Assessment: Faces Faces Pain Scale: Hurts whole lot Pain Location: chronic back pain with movement Pain Descriptors / Indicators: Discomfort;Moaning Pain Intervention(s): Limited activity within patient's tolerance;Monitored during session    Home Living                      Prior Function            PT Goals (current goals can now be found in the care plan section) Acute Rehab PT Goals Patient Stated Goal: to decrease pain PT Goal Formulation: With patient Time For Goal Achievement: 12/26/20 Potential to Achieve Goals: Poor Progress towards PT goals: Not progressing toward goals - comment    Frequency    Min 2X/week      PT Plan Frequency needs to be updated    Co-evaluation              AM-PAC PT "6 Clicks" Mobility   Outcome Measure  Help needed turning from your back to your side while in a flat bed without using bedrails?: Total Help needed moving from lying on your back to sitting on the side of a flat bed without using bedrails?: Total Help needed moving to and from a bed to a chair (including a wheelchair)?: Total Help needed standing up from a chair using your arms (e.g., wheelchair or bedside chair)?: Total Help needed to walk in hospital room?: Total Help needed climbing 3-5 steps with a railing? : Total 6 Click Score: 6    End of Session Equipment Utilized During Treatment: Oxygen Activity Tolerance: Patient limited by pain;Patient limited by fatigue;Patient limited by lethargy Patient left: in bed;with call bell/phone within reach;with bed alarm set   PT Visit Diagnosis: Unsteadiness on feet (R26.81);Muscle weakness (generalized) (M62.81);History of falling (Z91.81);Pain Pain - Right/Left:   (center) Pain - part of body:  (back)     Time: 1010-1034 PT Time Calculation (min) (ACUTE ONLY): 24 min  Charges:  $Therapeutic Activity: 23-37 mins                     Donna Bernard, PT, MPT    Ina Homes 12/17/2020, 11:34 AM

## 2020-12-17 NOTE — Progress Notes (Signed)
Occupational Therapy Treatment Patient Details Name: Jamie Benitez MRN: [id number] DOB: 1945/10/04 Today's Date: 12/17/2020   History of present illness Pt admitted to [facility] on 12/09/20 for c/o progressive generalized weakness and hypoxia, with sat in 80's at home on RA. Significant PMH includes: sCHF (EF 35%), CAD with stent placement, CKD (3a), cLBP, Bell's Palsy, HTN, HLD, diabetes, GERD, depression, anxiety. Elevated troponin likely due to demand ischemia and not MI. Developed tachyarrhythmia with Afib as well as worsening AKI, hypotension, and was transferred to SD/ICU for pressor support, amiodarone gtt, and nephrology consult. Spine MRI negative for spinal abscess. 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   OT comments  Pt seen for OT Tx this date to f/u re: safety with ADLs. Pt requires moderate to maximal multimodal cues throughout session to engage. Per care team, there is some discussion about [place] happening with Palliative, patient and family. Pt requires MOD/MAX A, bed level, and extended time to perform self feeding for ~1/3 of ice cream cup. Pt with minimal verbal/conversational engagement. She is noted to moan every so often, but denies pain when asked. However, she will have the occasional grimace when OT attempts to optimize pt positioning (Ex: raising HOB for eating). Pt left with all needs met and in reach, care team updated regarding therapy and potential appropriateness moving forward with patient/family [place] discussion. Will monitor chart and communicate with care team for guidance regarding OT services moving forward.    Recommendations for follow up therapy are one component of a multi-disciplinary discharge planning process, led by the attending physician.  Recommendations may be updated based on patient status, additional functional criteria and insurance authorization.    Follow Up Recommendations  SNF;Supervision/Assistance - 24  hour    Equipment Recommendations  Other (comment)    Recommendations for Other Services      Precautions / Restrictions Precautions Precautions: Fall Precaution Comments: temp R IJ for hemodialysis Restrictions Weight Bearing Restrictions: No Other Position/Activity Restrictions: Restricted to sitting EOB       Mobility Bed Mobility Overal bed mobility: Needs Assistance Bed Mobility: Supine to Sit;Sit to Supine Rolling: Total assist   Supine to sit: Total assist;+2 for physical assistance Sit to supine: Total assist;+2 for physical assistance   General bed mobility comments: deferred    Transfers                 General transfer comment: deferred    Balance   Sitting-balance support: Feet unsupported;Single extremity supported Sitting balance-Leahy Scale: Zero Sitting balance - Comments: Total assistance initially progressing to Max- Mod A with increased sitting time. heart rate 88-90's with seated activity. total sitting time less than 2 minutes                                   ADL either performed or assessed with clinical judgement   ADL Overall ADL's : Needs assistance/impaired Eating/Feeding: Moderate assistance;Maximal assistance;Bed level Eating/Feeding Details (indicate cue type and reason): high fowler's position with HOB elevated, requires multimodal cueing to participate. Her attn and ability to contribute waxes and wanes throughout session. Initially hand over hand, but with extended prompting, pt can complete 1/3 of hand to mouth bites of ice cream with initiation by therapist only.  Vision Baseline Vision/History: 2 Legally blind     Perception     Praxis      Cognition Arousal/Alertness: Lethargic Behavior During Therapy: Flat affect Overall Cognitive Status: Difficult to assess                                 General Comments: flat, staring into  space, requires increased time to follow commands and follows ~40% simple one step commands. Benefits from tactile cues to initiate tasks.        Exercises Other Exercises Other Exercises: extended time spent engaging pt in self-feeding to promote improved oral intake. Pt requires cues throughout   Shoulder Instructions       General Comments After assisting patient back to bed, she asks "why can't I just die?" Disucssed this with Palliative Care after end of session. PT frequency adjusted based on participation, current medical issues ongoing, hemodialysis.    Pertinent Vitals/ Pain       Pain Assessment: Faces Faces Pain Scale: No hurt Pain Location: no pain at rest Pain Descriptors / Indicators: Discomfort;Moaning Pain Intervention(s): Limited activity within patient's tolerance;Monitored during session  Home Living                                          Prior Functioning/Environment              Frequency  Min 2X/week        Progress Toward Goals  OT Goals(current goals can now be found in the care plan section)  Progress towards OT goals: Not progressing toward goals - comment (potential for discussion amongst care team from)  Acute Rehab OT Goals Patient Stated Goal: to decrease pain OT Goal Formulation: With patient Time For Goal Achievement: 12/29/20 Potential to Achieve Goals: [place] Discharge plan remains appropriate;Frequency remains appropriate    Co-evaluation                 AM-PAC OT "6 Clicks" Daily Activity     Outcome Measure   Help from another person eating meals?: A Lot Help from another person taking care of personal grooming?: Total Help from another person toileting, which includes using toliet, bedpan, or urinal?: Total Help from another person bathing (including washing, rinsing, drying)?: Total Help from another person to put on and taking off regular upper body clothing?: Total Help from another  person to put on and taking off regular lower body clothing?: Total 6 Click Score: 7    End of Session Equipment Utilized During Treatment: Gait belt;Rolling walker  OT Visit Diagnosis: Unsteadiness on feet (R26.81);Muscle weakness (generalized) (M62.81)   Activity Tolerance Patient limited by pain   Patient Left in bed;with call bell/phone within reach;with bed alarm set   Nurse Communication Mobility status;Patient requests pain meds;Other (comment)        Time: 6767-2094 OT Time Calculation (min): 21 min  Charges: OT General Charges $OT Visit: 1 Visit OT Treatments $Self Care/Home Management : 8-22 mins  Gerrianne Scale, Weed, OTR/L ascom [phone] 12/17/20, 2:46 PM

## 2020-12-18 ENCOUNTER — Encounter
Admission: EM | Disposition: A | Discharge status: Hospice | Payer: Self-pay | Source: Home / Self Care | Attending: Internal Medicine

## 2020-12-18 SURGERY — DIALYSIS/PERMA CATHETER INSERTION
Anesthesia: Moderate Sedation

## 2020-12-18 MED ORDER — MORPHINE SULFATE (PF) 2 MG/ML IV SOLN
2.0000 mg | INTRAVENOUS | Status: DC | PRN
  Administered 2020-12-18 – 2020-12-19 (×5): 2 mg via INTRAVENOUS
  Filled 2020-12-18 (×5): qty 1

## 2020-12-18 MED ORDER — OXYCODONE HCL 5 MG PO TABS: 5.0000 mg | ORAL_TABLET | ORAL | 0 refills | Status: AC | PRN

## 2020-12-18 MED ORDER — LORAZEPAM 1 MG PO TABS: 1.0000 mg | ORAL_TABLET | ORAL | 0 refills | Status: AC | PRN

## 2020-12-18 NOTE — Progress Notes (Signed)
ID MSSa bacteremia, CHF, AKI needing dialysis this admission, encephalopathy, seizure, CVA Pt is transitioning to hospice care ID will sign off

## 2020-12-18 NOTE — Progress Notes (Addendum)
[facility]) Hospital Liaison Note   Hospice Home is able to offer a bed to patient today with request of transport to Hospice Home after 8 pm. Requested transport for 8 pm with [facility].   Family agreeable to transfer today. TOC Manager aware.   RN please call report to Hospice Home at [phone] prior to patient leaving the unit.  Please send signed and completed DNR with patient at discharge.   Please do not hesitate to call with any hospice related questions.    Thank you for the opportunity to participate in this patient's care.   Bobbie "Einar Gip, RN, BSN [facility] Liaison [phone]

## 2020-12-18 NOTE — TOC Transition Note (Signed)
Transition of Care [facility]) - CM/SW Discharge Note   Patient Details  Name: Jamie Benitez MRN: [id number] Date of Birth: 03/17/1946  Transition of Care [facility]) CM/SW Contact:  Gildardo Griffes, LCSW Phone Number: 12/18/2020, 1:56 PM   Clinical Narrative:     Patient will DC to: [facility] Anticipated DC date: 12/18/20 Family notified:[facility] has updated family Transport [id number] at 8:00 pm Karen Kitchens with [facility] has arranged transport, EMS forms on chart)  Per MD patient ready for DC to Hospice Home . RN, patient, patient's family, and facility notified of DC.Marland Kitchen RN given number for report  [phone] . DC packet on chart. Ambulance transport requested for patient.  CSW signing off.  Angeline Slim, LCSW    Final next level of care: Hospice Medical Facility Barriers to Discharge: No Barriers Identified   Patient Goals and CMS Choice Patient states their goals for this hospitalization and ongoing recovery are:: to go home CMS Medicare.gov Compare Post Acute Care list provided to:: Patient Represenative (must comment) Choice offered to / list presented to : Adult Children  Discharge Placement              Patient chooses bed at:  (hospice home) Patient to be transferred to facility by: [facility] Name of family member notified: [facility] informed Patient and family notified of of transfer: 12/18/20  Discharge Plan and Services                                     Social Determinants of Health (SDOH) Interventions     Readmission Risk Interventions No flowsheet data found.

## 2020-12-18 NOTE — Plan of Care (Signed)

## 2020-12-18 NOTE — Progress Notes (Signed)
PROGRESS NOTE    Jamie Benitez  YOV:[id number] DOB: [birth year] DOA: 12/09/2020 PCP: Derinda Late, MD    Brief Narrative:  75 y.o. female with medical history significant of sCHF with EF 35%, CAD with stent placement, CKD-3A, chronic back pain, Bell's palsy, HTN, hyperlipidemia, diabetes mellitus, GERD, depression with anxiety, who presents with weakness.   Patient states that she has generalized weakness for more than 4 days.  No unilateral numbness or tingling to extremities.  No facial droop or slurred speech.  Patient has dry cough, denies chest pain or shortness breath.  No fever or chills.  Patient states that she had diarrhea in the past several days, which has resolved.  Currently no nausea, vomiting, diarrhea or abdominal pain.  No symptoms of UTI.  Patient complains of chronic lower back pain. Initially patient had oxygen desaturation to mid 80s, which improved to 98% on room air in ED.   Initial trop 105 with ST depression and T wave inversion in inferior leads and anterior leads.  Dr. Saunders Revel of cardiology was consulted, who did not think patient had STEMI. He recommended [facility] cardiology consult. IV heparin is started in ED  Cardiology patient's presentation is inconsistent with NSTEMI.  No significant delta in troponin.  VQ scan and lower extremity duplex negative for VTE.  Patient did have elements of mild BNP elevation and small left pleural effusion possible decompensated heart failure.  Was not given diuretics due to elevated kidney function.  Subsequently patient went into a tachyarrhythmia likely atrial fibrillation which she does not have a history of.  Also creatinine worsened from 2-4.2.  Nephrology consult 9/22.  9/23: Patient was moved to stepdown unit for closer monitoring due to persistent hypotension.  Not requiring vasopressor support.  Remains on amiodarone.  Remains in atrial fibrillation, rate control improved.  Kidney function continues to deteriorate.  Discussed with  nephrology.  Patient still undecided about initiation of hemodialysis  9/25: Early this morning patient had RRT called for decreased level of mentation and possible seizure activity.  Neurology consult requested.  Stat CT head negative for mass or bleed.  ABG with mild hypoxia but otherwise reassuring.  Neurology ordering phenytoin and routine EEG as well as MRI brain.  Patient remains encephalopathic and unable to provide history.  9/26: Patient little bit more awake today.  Grandson at bedside.  Patient had temporary dialysis line placed in right IJ on 9/25.  To undergo hemodialysis today.   Assessment & Plan:   Principal Problem:   NSTEMI (non-ST elevated myocardial infarction) ([place]) Active Problems:   Chronic systolic CHF (congestive heart failure) (HCC)   Chronic back pain   Acute renal failure (HCC)   Depression with anxiety   Type II diabetes mellitus with renal manifestations (HCC)   CAD (coronary artery disease)   HLD (hyperlipidemia)   HTN (hypertension)   Hypokalemia   Leukocytosis   Acute decompensated heart failure (HCC)   Pressure ulcer   Generalized weakness   DNR (do not resuscitate)   Palliative care by specialist   Pressure injury of skin   Comfort care measures  --decision made by family to pursue comfort care today.  Hospice referral made.  Possible new onset seizure 8 mm acute infarct in left parietal white matter Patient had shaking activity and encephalopathy noted early 9/25 Seizure activity versus uremic encephalopathy primary differentials Neurology consulted CT head negative MRI brain with small acute infarct --d/c'ed IV Keppra and plavix, due to comfort care status  Acute renal  failure superimposed on stage 3a chronic kidney disease ([place]):  Started on HD Metabolic acidosis  Hyponatremia recent baseline creatinine 1.2 on 10/28/2019.   Her creatinine is at 2.02, BUN 35 on admission Creatinine progressively worsening Dialysis catheter placed  9/25 HD 9/26 --stopped dialysis, due to comfort care status  Possible MSSA bacteremia Unclear what this this represents infection or true contaminant Does not meet sepsis criteria No clear source identified TTE negative MRI spine negative Plan: --d/c Ancef due to comfort care status --will not obtain TEE due to comfort care status  NSTEMI, ruled out history of CAD: S/p of stent placement  trop  105 --> 93 --> 93.   Patient does not have chest pain, but has generalized weakness.   Dr. Clayborn Bigness of cardiology is consulted.   D-dimer is positive, but VQ scan negative for PE.   Lower extremity Dopplers negative for DVT. Per cardiology patient's presentation is inconsistent with NSTEMI --d/c'ed plavix due to comfort care status  New onset atrial fibrillation with rapid ventricular response Noted on telemetry 9/22 Ventricular rates up to 130s Plan: --d/c amiodarone 200 mg BID due to comfort care status  Chronic systolic CHF (congestive heart failure) ([place]) 2D echo on 05/22/2017 showed EF of 35.  Patient has 1+ leg edema, elevated BNP 398, indicating possible fluid overload.   Patient has progressively deteriorating kidney function.     Chronic back pain --oxycodone PRN --IV morphine PRN  Depression with anxiety --IV Ativan   Type II diabetes mellitus with renal manifestations (HCC) Recent A1c 5.8, well controlled.   Patient is taking 70/30 NPH insulin and glucose at home. --has not been receiving 70/30 or many units of SSI, and BG have been mostly within inpatient goal. --d/c'ed BG checks and SSI   HLD (hyperlipidemia) --d/c'ed statin due to comfort care status   HTN (hypertension) --BP varied widely --Hold BP meds for now   Hypokalemia --no more monitoring  Possible bile duct dilatation Noted partial on MRI LFTs normal   DVT prophylaxis: none, due to comfort care status Code Status: DNR Family Communication:   Disposition Plan: Status is: Inpatient  Dispo:  The patient is from: Home              Anticipated d/c is to: hospice facility              Patient currently ready for discharge.    Difficult to place patient No    Level of care: Med-Surg  Consultants:  Nephrology Cardiology  Procedures:  None  Antimicrobials:  Cefazolin   Subjective: Hospice was able to take pt after 8 pm today, however, due to the weather, transportation was on hold.  Pt was sleeping but woke up to confirm that she was comfortable.   Objective: Vitals:   12/17/20 1700 12/17/20 2007 12/17/20 2359 12/18/20 0805  BP: (!) 110/58 (!) 155/84 (!) 147/101 138/62  Pulse: 100 96 (!) 132 (!) 111  Resp: _0 Temp: 97.6 F (36.4 C) 98.1 F (36.7 C) 98 F (36.7 C) 98.3 F (36.8 C)  TempSrc: Axillary     SpO2: 96%  91% 93%  Weight:      Height:        Intake/Output Summary (Last 24 hours) at 12/18/2020 1727 Last data filed at 12/18/2020 1415 Gross per 24 hour  Intake 170 ml  Output 900 ml  Net -730 ml   Filed Weights   12/09/20 0354 12/13/20 0503  Weight: 102.1 kg 98.3 kg  Examination:  Constitutional: NAD, sleeping, but arousable HEENT: conjunctivae and lids normal, EOMI CV: No cyanosis.   RESP: normal respiratory effort, on RA ([place] not inside her nostrils.   Extremities: swelling in all extremities  Data Reviewed: I have personally reviewed following labs and imaging studies  CBC: Recent Labs  Lab 12/12/20 1158 12/13/20 0730 12/14/20 0655 12/15/20 0620 12/17/20 0700  WBC 16.0* 17.6* 12.4* 10.5 15.8*  NEUTROABS 14.1*  --   --   --   --   HGB 9.8* 10.0* 8.7* 8.4* 8.3*  HCT 27.1* 28.6* 23.6* 22.6* 23.0*  MCV 77.4* 79.9* 74.4* 74.8* 75.9*  PLT 173 155 185 193 801   Basic Metabolic Panel: Recent Labs  Lab 12/13/20 0730 12/14/20 0655 12/15/20 0620 12/16/20 0441 12/17/20 0700  NA 128* 131* 133* 133* 135  K 3.7 3.7 3.3* 4.1 3.4*  CL 86* 90* 92* 98 97*  CO2 19* _0 GLUCOSE 143* 121* 127* 118* 145*  BUN 80*  89* 66* 56* 41*  CREATININE 6.07* 6.24* 5.41* 4.39* 3.54*  CALCIUM 6.3* 6.5* 7.0* 7.2* 7.8*  MG  --   --   --   --  1.9  PHOS  --   --  4.9*  --   --    GFR: Estimated Creatinine Clearance: 14.9 mL/min (A) (by C-G formula based on SCr of 3.54 mg/dL (H)). Liver Function Tests: Recent Labs  Lab 12/11/20 1815 12/12/20 1158 12/15/20 1252  AST 101* 101* 34  ALT 11 7 <5  ALKPHOS 93 110 115  BILITOT 0.7 0.7 0.9  PROT 4.2* 4.8* 4.8*  ALBUMIN 1.8* 2.0* 1.7*   No results for input(s): LIPASE, AMYLASE in the last 168 hours.  No results for input(s): AMMONIA in the last 168 hours. Coagulation Profile: No results for input(s): INR, PROTIME in the last 168 hours.  Cardiac Enzymes: Recent Labs  Lab 12/15/20 1252  CKTOTAL 437*   BNP (last 3 results) No results for input(s): PROBNP in the last 8760 hours. HbA1C: No results for input(s): HGBA1C in the last 72 hours.  CBG: Recent Labs  Lab 12/16/20 1351 12/16/20 1559 12/16/20 2036 12/17/20 0730 12/17/20 1138  GLUCAP 78 204* 120* 152* 175*   Lipid Profile: No results for input(s): CHOL, HDL, LDLCALC, TRIG, CHOLHDL, LDLDIRECT in the last 72 hours.  Thyroid Function Tests: No results for input(s): TSH, T4TOTAL, FREET4, T3FREE, THYROIDAB in the last 72 hours.  Anemia Panel: No results for input(s): VITAMINB12, FOLATE, FERRITIN, TIBC, IRON, RETICCTPCT in the last 72 hours. Sepsis Labs: No results for input(s): PROCALCITON, LATICACIDVEN in the last 168 hours.   Recent Results (from the past 240 hour(s))  Resp Panel by RT-PCR (Flu A&B, Covid) Nasopharyngeal Swab     Status: None   Collection Time: 12/09/20  4:34 AM   Specimen: Nasopharyngeal Swab; Nasopharyngeal(NP) swabs in vial transport medium  Result Value Ref Range Status   SARS Coronavirus 2 by RT PCR NEGATIVE NEGATIVE Final    Comment: (NOTE) SARS-CoV-2 target nucleic acids are NOT DETECTED.  The SARS-CoV-2 RNA is generally detectable in upper respiratory specimens  during the acute phase of infection. The lowest concentration of SARS-CoV-2 viral copies this assay can detect is 138 copies/mL. A negative result does not preclude SARS-Cov-2 infection and should not be used as the sole basis for treatment or other patient management decisions. A negative result may occur with  improper specimen collection/handling, submission of specimen other than nasopharyngeal swab, presence of viral mutation(s) within  the areas targeted by this assay, and inadequate number of viral copies(<138 copies/mL). A negative result must be combined with clinical observations, patient history, and epidemiological information. The expected result is Negative.  Fact Sheet for Patients:  EntrepreneurPulse.com.au  Fact Sheet for Healthcare Providers:  IncredibleEmployment.be  This test is no t yet approved or cleared by the [place] FDA and  has been authorized for detection and/or diagnosis of SARS-CoV-2 by FDA under an Emergency Use Authorization (EUA). This EUA will remain  in effect (meaning this test can be used) for the duration of the COVID-19 declaration under Section 564(b)(1) of the Act, 21 U.S.C.section 360bbb-3(b)(1), unless the authorization is terminated  or revoked sooner.       Influenza A by PCR NEGATIVE NEGATIVE Final   Influenza B by PCR NEGATIVE NEGATIVE Final    Comment: (NOTE) The Xpert Xpress SARS-CoV-2/FLU/RSV plus assay is intended as an aid in the diagnosis of influenza from Nasopharyngeal swab specimens and should not be used as a sole basis for treatment. Nasal washings and aspirates are unacceptable for Xpert Xpress SARS-CoV-2/FLU/RSV testing.  Fact Sheet for Patients: EntrepreneurPulse.com.au  Fact Sheet for Healthcare Providers: IncredibleEmployment.be  This test is not yet approved or cleared by the [place] FDA and has been authorized for detection  and/or diagnosis of SARS-CoV-2 by FDA under an Emergency Use Authorization (EUA). This EUA will remain in effect (meaning this test can be used) for the duration of the COVID-19 declaration under Section 564(b)(1) of the Act, 21 U.S.C. section 360bbb-3(b)(1), unless the authorization is terminated or revoked.  Performed at [facility], [address] [place], [place] [address]   Blood culture (routine single)     Status: Abnormal   Collection Time: 12/09/20  4:34 AM   Specimen: BLOOD  Result Value Ref Range Status   Specimen Description   Final    BLOOD LEFT ARM Performed at [facility], [address], [place], [place] [address]    Special Requests   Final    BOTTLES DRAWN AEROBIC AND ANAEROBIC Blood Culture adequate volume Performed at [address] [place], [place] [address]    Culture  Setup Time   Final    GRAM POSITIVE COCCI IN BOTH AEROBIC AND ANAEROBIC BOTTLES CRITICAL RESULT CALLED TO, READ BACK BY AND VERIFIED WITH: Aldona Bar RAEUR AT 9147 ON 12/09/20 BY SS Performed at [facility], [place] [address], [place], [place] [address]    Culture STAPHYLOCOCCUS AUREUS (A)  Final   Report Status 12/11/2020 FINAL  Final   Organism ID, Bacteria STAPHYLOCOCCUS AUREUS  Final      Susceptibility   Staphylococcus aureus - MIC*    CIPROFLOXACIN 1 SENSITIVE Sensitive     ERYTHROMYCIN <=0.25 SENSITIVE Sensitive     GENTAMICIN <=0.5 SENSITIVE Sensitive     OXACILLIN 0.5 SENSITIVE Sensitive     TETRACYCLINE <=1 SENSITIVE Sensitive     VANCOMYCIN <=0.5 SENSITIVE Sensitive     TRIMETH/SULFA <=10 SENSITIVE Sensitive     CLINDAMYCIN <=0.25 SENSITIVE Sensitive     RIFAMPIN <=0.5 SENSITIVE Sensitive     Inducible Clindamycin NEGATIVE Sensitive     * STAPHYLOCOCCUS AUREUS  Blood Culture ID Panel (Reflexed)     Status: Abnormal   Collection Time: 12/09/20  4:34 AM  Result Value Ref Range Status   Enterococcus faecalis NOT DETECTED NOT  DETECTED Final   Enterococcus Faecium NOT DETECTED NOT DETECTED Final   Listeria monocytogenes NOT DETECTED NOT DETECTED  Final   Staphylococcus species DETECTED (A) NOT DETECTED Final    Comment: CRITICAL RESULT CALLED TO, READ BACK BY AND VERIFIED WITH: SAMANTHA RAEUR AT 1750 ON 12/09/20 BY SS    Staphylococcus aureus (BCID) DETECTED (A) NOT DETECTED Final    Comment: CRITICAL RESULT CALLED TO, READ BACK BY AND VERIFIED WITH: SAMANTHA RAEUR AT 1750 ON 12/09/20 BY SS    Staphylococcus epidermidis NOT DETECTED NOT DETECTED Final   Staphylococcus lugdunensis NOT DETECTED NOT DETECTED Final   Streptococcus species NOT DETECTED NOT DETECTED Final   Streptococcus agalactiae NOT DETECTED NOT DETECTED Final   Streptococcus pneumoniae NOT DETECTED NOT DETECTED Final   Streptococcus pyogenes NOT DETECTED NOT DETECTED Final   A.calcoaceticus-baumannii NOT DETECTED NOT DETECTED Final   Bacteroides fragilis NOT DETECTED NOT DETECTED Final   Enterobacterales NOT DETECTED NOT DETECTED Final   Enterobacter cloacae complex NOT DETECTED NOT DETECTED Final   Escherichia coli NOT DETECTED NOT DETECTED Final   Klebsiella aerogenes NOT DETECTED NOT DETECTED Final   Klebsiella oxytoca NOT DETECTED NOT DETECTED Final   Klebsiella pneumoniae NOT DETECTED NOT DETECTED Final   Proteus species NOT DETECTED NOT DETECTED Final   Salmonella species NOT DETECTED NOT DETECTED Final   Serratia marcescens NOT DETECTED NOT DETECTED Final   Haemophilus influenzae NOT DETECTED NOT DETECTED Final   Neisseria meningitidis NOT DETECTED NOT DETECTED Final   Pseudomonas aeruginosa NOT DETECTED NOT DETECTED Final   Stenotrophomonas maltophilia NOT DETECTED NOT DETECTED Final   Candida albicans NOT DETECTED NOT DETECTED Final   Candida auris NOT DETECTED NOT DETECTED Final   Candida glabrata NOT DETECTED NOT DETECTED Final   Candida krusei NOT DETECTED NOT DETECTED Final   Candida parapsilosis NOT DETECTED NOT DETECTED Final    Candida tropicalis NOT DETECTED NOT DETECTED Final   Cryptococcus neoformans/gattii NOT DETECTED NOT DETECTED Final   Meth resistant mecA/C and MREJ NOT DETECTED NOT DETECTED Final    Comment: Performed at [facility], [address] [place], [place] [address]  Urine Culture     Status: Abnormal   Collection Time: 12/09/20  9:24 PM   Specimen: In/Out Cath Urine  Result Value Ref Range Status   Specimen Description   Final    IN/OUT CATH URINE Performed at [facility], [address] [place], [place] [address]    Special Requests   Final    NONE Performed at [facility], [address] [place], [place] [address]    Culture MULTIPLE SPECIES PRESENT, SUGGEST RECOLLECTION (A)  Final   Report Status 12/11/2020 FINAL  Final  CULTURE, BLOOD (ROUTINE X 2) w Reflex to ID Panel     Status: None   Collection Time: 12/10/20  4:29 PM   Specimen: BLOOD  Result Value Ref Range Status   Specimen Description BLOOD LEFT ANTECUBITAL  Final   Special Requests   Final    BOTTLES DRAWN AEROBIC ONLY Blood Culture adequate volume   Culture   Final    NO GROWTH 5 DAYS Performed at [facility], [address] [place], [place] [address]    Report Status 12/15/2020 FINAL  Final  CULTURE, BLOOD (ROUTINE X 2) w Reflex to ID Panel     Status: None   Collection Time: 12/10/20  5:19 PM   Specimen: BLOOD  Result Value Ref Range Status   Specimen Description BLOOD BLOOD LEFT FOREARM  Final   Special Requests   Final    BOTTLES DRAWN AEROBIC AND ANAEROBIC  Blood Culture adequate volume   Culture   Final    NO GROWTH 5 DAYS Performed at [facility], [address] [place], [place] [address]    Report Status 12/15/2020 FINAL  Final  MRSA Next Gen by PCR, Nasal     Status: None   Collection Time: 12/11/20  4:29 AM   Specimen: Nasal Mucosa; Nasal Swab  Result Value Ref Range Status   MRSA by PCR Next Gen NOT DETECTED NOT DETECTED Final    Comment:  (NOTE) The GeneXpert MRSA Assay (FDA approved for NASAL specimens only), is one component of a comprehensive MRSA colonization surveillance program. It is not intended to diagnose MRSA infection nor to guide or monitor treatment for MRSA infections. Test performance is not FDA approved in patients less than 33 years old. Performed at [facility], [address] [place], [place] [address]          Radiology Studies: No results found.      Scheduled Meds:  chlorhexidine gluconate (MEDLINE KIT)  15 mL Mouth Rinse BID   Chlorhexidine Gluconate Cloth  6 each Topical Daily   lidocaine  1 patch Transdermal Q24H   mouth rinse  15 mL Mouth Rinse 10 times per day   pantoprazole  40 mg Oral Daily   sodium chloride flush  10-40 mL Intracatheter Q12H   Continuous Infusions:     LOS: 8 days    Enzo Bi, MD [facility] Pager [phone]  If 7PM-7AM, please contact night-coverage 12/18/2020, 5:27 PM

## 2020-12-18 NOTE — Care Management Important Message (Signed)
Important Message  Patient Details  Name: Jamie Benitez MRN: [id number] Date of Birth: 1945/06/12   Medicare Important Message Given:  Other (see comment)  Transitioning to Hospice Home.  Medicare IM withheld at this time.    Johnell Comings 12/18/2020, 2:10 PM

## 2020-12-19 NOTE — Discharge Summary (Addendum)
Physician Discharge Summary   Jamie Benitez  female DOB: 01-Dec-1945  ZOX:[id number]  PCP: Kandyce Rud, MD  Admit date: 12/09/2020 Discharge date: 12/19/2020  Admitted From: home Disposition:  hospice facility CODE STATUS: DNR  Discharge Instructions     No wound care   Complete by: As directed    No wound care   Complete by: As directed         Hospital Course:  For full details, please see H&P, progress notes, consult notes and ancillary notes.  Briefly,  Jamie Benitez is a 75 y.o. female with medical history significant of sCHF with EF 35%, CAD with stent placement, CKD-3A, chronic back pain, Bell's palsy, HTN, hyperlipidemia, diabetes mellitus, GERD, depression with anxiety, who presented with weakness.  During hospitalization, pt was found to have multi-organ failure and worsening encephalopathy, and decision was made for comfort care and hospice facility discharge.  Comfort care measures  --decision made by family to pursue comfort care on 9/29.     Possible new onset seizure 8 mm acute infarct in left parietal white matter Patient had shaking activity and encephalopathy noted early 9/25 Seizure activity versus uremic encephalopathy primary differentials Neurology consulted CT head negative MRI brain with small acute infarct --d/c'ed IV Keppra and plavix, due to comfort care status   Acute renal failure superimposed on stage 3a chronic kidney disease (HCC):  Started on HD Metabolic acidosis  Hyponatremia recent baseline creatinine 1.2 on 10/28/2019.   Her creatinine is at 2.02, BUN 35 on admission Creatinine progressively worsening Dialysis catheter placed 9/25 HD 9/26 --stopped dialysis, due to comfort care status   MSSA bacteremia Does not meet sepsis criteria No clear source identified TTE negative MRI spine negative. ID consulted and pt was receiving Ancef until pt became comfort care.   NSTEMI, ruled out history of CAD: S/p of stent  placement  trop  105 --> 93 --> 93.   Patient does not have chest pain, but has generalized weakness.   Dr. Juliann Pares of cardiology is consulted.   D-dimer is positive, but VQ scan negative for PE.   Lower extremity Dopplers negative for DVT. Per cardiology patient's presentation is inconsistent with NSTEMI --d/c'ed plavix due to comfort care status   New onset atrial fibrillation with rapid ventricular response Noted on telemetry 9/22 Ventricular rates up to 130s --d/c'ed amiodarone 200 mg BID due to comfort care status   Chronic systolic CHF (congestive heart failure) (HCC) 2D echo on 05/22/2017 showed EF of 35.  Patient has 1+ leg edema, elevated BNP 398, indicating possible fluid overload.   Patient has progressively deteriorating kidney function.     Chronic back pain --oxycodone PRN --IV morphine PRN   Depression with anxiety --IV Ativan PRN   Type II diabetes mellitus with renal manifestations (HCC) Recent A1c 5.8, well controlled.   Patient is taking 70/30 NPH insulin and glucose at home. --has not been receiving 70/30 or many units of SSI, and BG have been mostly within inpatient goal. --d/c'ed BG checks and SSI   HLD (hyperlipidemia) --d/c'ed statin due to comfort care status   HTN (hypertension), not currently active Hypotension --BP varied widely, but mostly low and had been on midodrine PRN during hospitalization. --Hold BP meds and midodrine d/c'ed due to comfort care status.   Hypokalemia --no more monitoring   Possible bile duct dilatation Noted partial on MRI LFTs normal    Discharge Diagnoses:  Principal Problem:   NSTEMI (non-ST elevated myocardial infarction) (HCC) Active  Problems:   Chronic systolic CHF (congestive heart failure) (HCC)   Chronic back pain   Acute renal failure (HCC)   Depression with anxiety   Type II diabetes mellitus with renal manifestations (HCC)   CAD (coronary artery disease)   HLD (hyperlipidemia)   HTN  (hypertension)   Hypokalemia   Leukocytosis   Acute decompensated heart failure (HCC)   Pressure ulcer   Generalized weakness   DNR (do not resuscitate)   Palliative care by specialist   Pressure injury of skin   30 Day Unplanned Readmission Risk Score    Flowsheet Row ED to Hosp-Admission (Current) from 12/09/2020 in [facility]  30 Day Unplanned Readmission Risk Score (%) 13.28 Filed at 12/19/2020 0800       This score is the patient's risk of an unplanned readmission within 30 days of being discharged (0 -100%). The score is based on dignosis, age, lab data, medications, orders, and past utilization.   Low:  0-14.9   Medium: 15-21.9   High: 22-29.9   Extreme: 30 and above         Discharge Instructions:  Allergies as of 12/19/2020       Reactions   Sulfa Antibiotics Rash   Mouth blisters Other reaction(s): Angioedema "whole mouth swells"   Gabapentin Diarrhea        Medication List     STOP taking these medications    atorvastatin 80 MG tablet Commonly known as: LIPITOR   buPROPion 150 MG 24 hr tablet Commonly known as: WELLBUTRIN XL   clopidogrel 75 MG tablet Commonly known as: PLAVIX   glipiZIDE 2.5 MG 24 hr tablet Commonly known as: GLUCOTROL XL   HumuLIN 70/30 KwikPen (70-30) 100 UNIT/ML KwikPen Generic drug: insulin isophane & regular human KwikPen   hydrOXYzine 50 MG tablet Commonly known as: ATARAX/VISTARIL   isosorbide mononitrate 30 MG 24 hr tablet Commonly known as: IMDUR   lisinopril 5 MG tablet Commonly known as: ZESTRIL   meloxicam 15 MG tablet Commonly known as: MOBIC   mupirocin ointment 2 % Commonly known as: BACTROBAN   traMADol 50 MG tablet Commonly known as: ULTRAM   traZODone 50 MG tablet Commonly known as: DESYREL       TAKE these medications    LORazepam 1 MG tablet Commonly known as: Ativan Take 1 tablet (1 mg total) by mouth every 4 (four) hours as needed for up to 14 days for  anxiety.   omeprazole 40 MG capsule Commonly known as: PRILOSEC Take 40 mg by mouth daily.   oxyCODONE 5 MG immediate release tablet Commonly known as: Oxy IR/ROXICODONE Take 1 tablet (5 mg total) by mouth every 4 (four) hours as needed for moderate pain.          Allergies  Allergen Reactions   Sulfa Antibiotics Rash    Mouth blisters Other reaction(s): Angioedema "whole mouth swells"   Gabapentin Diarrhea     The results of significant diagnostics from this hospitalization (including imaging, microbiology, ancillary and laboratory) are listed below for reference.   Consultations:   Procedures/Studies: CT HEAD WO CONTRAST ( )  Result Date: 12/13/2020 CLINICAL DATA:  75 year old female with new onset drooling, altered mental status. EXAM: CT HEAD WITHOUT CONTRAST TECHNIQUE: Contiguous axial images were obtained from the base of the skull through the vertex without intravenous contrast. COMPARISON:  Head CT 07/06/2012. FINDINGS: Study is mildly degraded by motion artifact despite repeated imaging attempts. Brain: Cerebral volume is not significantly changed since 2014  and is within normal limits for age. No midline shift, ventriculomegaly, mass effect, evidence of mass lesion, intracranial hemorrhage or evidence of cortically based acute infarction. Allowing for mild motion gray-white matter differentiation is within normal limits for age throughout the brain. Vascular: Mild Calcified atherosclerosis at the skull base. No suspicious intracranial vascular hyperdensity. Skull: No acute osseous abnormality identified. Chronic anterior C1-C2 degeneration. Sinuses/Orbits: Chronic left maxillary sinusitis with mild improvement since 2014. Other Visualized paranasal sinuses and mastoids are clear. Other: No acute orbit or scalp soft tissue finding. IMPRESSION: 1. Normal for age non contrast CT appearance of the brain when allowing for mildly motion degraded study. 2. Chronic left  maxillary sinusitis. Electronically Signed   By: Odessa Fleming M.D.   On: 12/13/2020 09:08   MR ANGIO HEAD WO CONTRAST  Result Date: 12/13/2020 CLINICAL DATA:  Seizure, abnormal neuro exam. EXAM: MRA HEAD WITHOUT CONTRAST TECHNIQUE: Angiographic images of the Circle of Willis were acquired using MRA technique without intravenous contrast. COMPARISON:  Same-day brain MRI 12/13/2020. FINDINGS: Mildly motion degraded examination. Anterior circulation: The intracranial internal carotid arteries are patent. The M1 middle cerebral arteries are patent. Atherosclerotic irregularity of the M2 and more distal middle cerebral artery vessels bilaterally. Most notably, there are sites of severe stenosis within mid M2 left MCA vessels. The anterior cerebral arteries are patent. 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 Posterior circulation: The intracranial vertebral arteries are patent. The basilar artery is patent. The posterior cerebral arteries are patent. P1 PCA segments are hypoplastic bilaterally with superimposed high-grade stenoses. However, there are sizable bilateral posterior communicating arteries which are widely patent. Severe stenosis within the proximal P2 right PCA. Severe right PCA distal branch atherosclerotic irregularity. Anatomic variants: As described IMPRESSION: Intracranial atherosclerotic disease with multifocal stenoses, as outlined and with findings most notably as follows. Sites of severe stenosis within mid M2 left MCA vessels. The P1 posterior cerebral artery segments are hypoplastic with superimposed severe stenoses bilaterally. However, there are sizable bilateral posterior communicating arteries which are widely patent. Severe stenosis within the proximal P2 right PCA. Severe right PCA distal branch atherosclerotic irregularity. 2 mm vascular protrusion arising from the cavernous right ICA, likely  reflecting an aneurysm. Electronically Signed   By: Jackey Loge D.O.   On: 12/13/2020 14:45   MR ANGIO NECK WO CONTRAST  Result Date: 12/15/2020 CLINICAL DATA:  Acute neurologic deficit EXAM: MRA NECK WITHOUT CONTRAST TECHNIQUE: Angiographic images of the neck were acquired using MRA technique without intravenous contrast. Carotid stenosis measurements (when applicable) are obtained utilizing NASCET criteria, using the distal internal carotid diameter as the denominator. COMPARISON:  No pertinent prior exam. FINDINGS: The images are severely degraded by motion. Within that limitation, the carotid arteries appear to be patent from the bifurcation to the skull base. The V2 segments of the vertebral arteries also appear to be patent. IMPRESSION: Severely degraded by motion. Within that limitation, there is no visualized occlusion of the carotid or vertebral arteries. Electronically Signed   By: Deatra Robinson M.D.   On: 12/15/2020 02:32   MR BRAIN WO CONTRAST  Result Date: 12/13/2020 CLINICAL DATA:  Seizure, abnormal neuro exam. EXAM: MRI HEAD WITHOUT CONTRAST TECHNIQUE: Multiplanar, multiecho pulse sequences of the brain and surrounding structures were obtained without intravenous contrast. COMPARISON:  Prior head CT examinations 12/13/2020 and earlier. FINDINGS: Brain: Mild intermittent motion degradation. Mild generalized cerebral and cerebellar atrophy. 8 mm acute infarct within  the left parietal white matter (series 6, image 27). Additional suspected punctate acute infarct within the left parietal cortex. Mild multifocal T2/FLAIR hyperintense signal abnormality within the cerebral white matter, nonspecific but compatible with chronic small vessel ischemic disease. Small chronic lacunar infarct within the right thalamus. Small chronic lacunar infarcts within the right cerebellar hemisphere. The hippocampi are symmetric in size and signal. No evidence of an intracranial mass. No chronic intracranial blood  products. No extra-axial fluid collection. No midline shift. Vascular: Maintained flow voids within the proximal large arterial vessels. Skull and upper cervical spine: No focal suspicious marrow lesion. Sinuses/Orbits: Left optic nerve atrophy. Right lens replacement. Moderate mucosal thickening within an asymmetrically diminutive left maxillary sinus with associated chronic reactive osteitis. Trace mucosal thickening within the bilateral ethmoid air cells. IMPRESSION: Mildly motion degraded exam. 8 mm acute infarct within the left parietal white matter. Additional suspected punctate acute infarct within the left parietal cortex. Background mild chronic small-vessel ischemic changes within the cerebral white matter. Small chronic lacunar infarcts within the left thalamus and right cerebellar hemisphere. Mild generalized parenchymal atrophy. Left optic nerve atrophy. Paranasal sinus disease, most notably moderate left maxillary sinusitis. Electronically Signed   By: Jackey Loge D.O.   On: 12/13/2020 14:37   MR THORACIC SPINE WO CONTRAST  Result Date: 12/11/2020 CLINICAL DATA:  Low back pain, infection suspected; Mid-back pain, neuro deficit EXAM: MRI THORACIC AND LUMBAR SPINE WITHOUT CONTRAST TECHNIQUE: Multiplanar and multiecho pulse sequences of the thoracic and lumbar spine were obtained without intravenous contrast. COMPARISON:  None. FINDINGS: MRI THORACIC SPINE FINDINGS Alignment:  No substantial sagittal subluxation. Vertebrae: Vertebral body heights are maintained. No focal marrow edema to suggest osteomyelitis or acute fracture. Cord:  Normal cord signal. Paraspinal and other soft tissues: Appreciable paraspinal edema. Hiatal hernia. Disc levels: Mild disc bulging at T7-T8 and T8-T9 without significant canal stenosis. Small broad disc bulge with central disc protrusion at T9-T10 with ligamentum flavum thickening. Resulting mild canal stenosis. Multilevel facet arthropathy, greatest in the lower  thoracic spine with mild bilateral foraminal stenosis at T8-T9, T9-T10, and T10-T11. Mild foraminal stenosis on the right at T11-T12. T12-L1 levels discussed below. MRI LUMBAR SPINE FINDINGS Segmentation:  Standard. Alignment:  Grade 1 anterolisthesis L4 on L5. Vertebrae: Edema within the T12-L1, L4-L5 and L5-S1 discs. Chronic appearing degenerative endplate signal changes at T12-L1 without significant marrow edema. Conus medullaris and cauda equina: Conus extends to the L1 level. Conus appears normal. Paraspinal and other soft tissues: Partially imaged dilated gallbladder with borderline dilated common bile duct, measuring approximately 6 mm. Left renal cysts. Disc levels: T12-L1: Degenerative disc height loss with disc edema. Posterior disc bulge with moderate bilateral facet hypertrophy. Ligamentum flavum thickening. Resulting moderate right foraminal stenosis and mild canal stenosis. No significant left foraminal stenosis. L1-L2: Left eccentric disc bulge and mild left greater than right facet hypertrophy. Ligamentum flavum thickening. Resulting mild left foraminal stenosis without significant canal or right foraminal stenosis. L2-L3: Mild disc bulging and mild-to-moderate bilateral facet arthropathy. No significant canal or foraminal stenosis. L3-L4: Broad disc bulge with bulky ligamentum flavum thickening and moderate bilateral facet arthropathy. Resulting mild-to-moderate canal stenosis without significant foraminal stenosis. L4-L5: Right eccentric disc height loss. Grade 1 anterolisthesis of L4 on L5. Uncovering of the disc with superimposed small left subarticular disc protrusion. Moderate right greater than left facet hypertrophy. Resulting mild to moderate right subarticular and foraminal stenosis. No significant canal or left foraminal stenosis. L5-S1: Left eccentric disc height loss. Left eccentric disc bulge and  endplate spurring. Moderate left mild right facet arthropathy. Resulting mild-to-moderate  left foraminal stenosis with mild left subarticular recess narrowing. No significant canal or right foraminal stenosis. IMPRESSION: MR THORACIC SPINE IMPRESSION 1. Mild canal stenosis at T9-T10. Otherwise, multilevel small disc bulges without significant canal stenosis. 2. Mild multilevel foraminal stenosis in the lower thoracic spine, described above. 3. T12-L1 is discussed below. 4. Hiatal hernia. MR LUMBAR SPINE IMPRESSION 1. At T12-L1, moderate right foraminal stenosis with mild canal stenosis. Degenerative disc disease at this level with disc edema and slight left paraspinal edema, most likely degenerative/inflammatory in etiology. Infectious discitis is thought less likely but if there is clinical concern for infection then consider correlation with inflammatory markers and short interval follow-up MRI with contrast to ensure stability. 2. At L3-L4, mild-to-moderate canal stenosis. 3. At L4-L5, right eccentric degenerative change with mild-to-moderate right subarticular recess and foraminal stenosis. 4. At L5-S1, left eccentric degenerative change with mild-to-moderate left foraminal stenosis and mild left subarticular recess narrowing. 5. Partially imaged dilated gallbladder with borderline dilated common bile duct. This could be physiologic; however, consider correlation with liver function enzymes to exclude obstruction. Right upper quadrant ultrasound could also further evaluate if clinically indicated. Electronically Signed   By: Feliberto Harts M.D.   On: 12/11/2020 12:29   MR LUMBAR SPINE WO CONTRAST  Result Date: 12/11/2020 CLINICAL DATA:  Low back pain, infection suspected; Mid-back pain, neuro deficit EXAM: MRI THORACIC AND LUMBAR SPINE WITHOUT CONTRAST TECHNIQUE: Multiplanar and multiecho pulse sequences of the thoracic and lumbar spine were obtained without intravenous contrast. COMPARISON:  None. FINDINGS: MRI THORACIC SPINE FINDINGS Alignment:  No substantial sagittal subluxation.  Vertebrae: Vertebral body heights are maintained. No focal marrow edema to suggest osteomyelitis or acute fracture. Cord:  Normal cord signal. Paraspinal and other soft tissues: Appreciable paraspinal edema. Hiatal hernia. Disc levels: Mild disc bulging at T7-T8 and T8-T9 without significant canal stenosis. Small broad disc bulge with central disc protrusion at T9-T10 with ligamentum flavum thickening. Resulting mild canal stenosis. Multilevel facet arthropathy, greatest in the lower thoracic spine with mild bilateral foraminal stenosis at T8-T9, T9-T10, and T10-T11. Mild foraminal stenosis on the right at T11-T12. T12-L1 levels discussed below. MRI LUMBAR SPINE FINDINGS Segmentation:  Standard. Alignment:  Grade 1 anterolisthesis L4 on L5. Vertebrae: Edema within the T12-L1, L4-L5 and L5-S1 discs. Chronic appearing degenerative endplate signal changes at T12-L1 without significant marrow edema. Conus medullaris and cauda equina: Conus extends to the L1 level. Conus appears normal. Paraspinal and other soft tissues: Partially imaged dilated gallbladder with borderline dilated common bile duct, measuring approximately 6 mm. Left renal cysts. Disc levels: T12-L1: Degenerative disc height loss with disc edema. Posterior disc bulge with moderate bilateral facet hypertrophy. Ligamentum flavum thickening. Resulting moderate right foraminal stenosis and mild canal stenosis. No significant left foraminal stenosis. L1-L2: Left eccentric disc bulge and mild left greater than right facet hypertrophy. Ligamentum flavum thickening. Resulting mild left foraminal stenosis without significant canal or right foraminal stenosis. L2-L3: Mild disc bulging and mild-to-moderate bilateral facet arthropathy. No significant canal or foraminal stenosis. L3-L4: Broad disc bulge with bulky ligamentum flavum thickening and moderate bilateral facet arthropathy. Resulting mild-to-moderate canal stenosis without significant foraminal stenosis.  L4-L5: Right eccentric disc height loss. Grade 1 anterolisthesis of L4 on L5. Uncovering of the disc with superimposed small left subarticular disc protrusion. Moderate right greater than left facet hypertrophy. Resulting mild to moderate right subarticular and foraminal stenosis. No significant canal or left foraminal stenosis. L5-S1: Left eccentric disc  height loss. Left eccentric disc bulge and endplate spurring. Moderate left mild right facet arthropathy. Resulting mild-to-moderate left foraminal stenosis with mild left subarticular recess narrowing. No significant canal or right foraminal stenosis. IMPRESSION: MR THORACIC SPINE IMPRESSION 1. Mild canal stenosis at T9-T10. Otherwise, multilevel small disc bulges without significant canal stenosis. 2. Mild multilevel foraminal stenosis in the lower thoracic spine, described above. 3. T12-L1 is discussed below. 4. Hiatal hernia. MR LUMBAR SPINE IMPRESSION 1. At T12-L1, moderate right foraminal stenosis with mild canal stenosis. Degenerative disc disease at this level with disc edema and slight left paraspinal edema, most likely degenerative/inflammatory in etiology. Infectious discitis is thought less likely but if there is clinical concern for infection then consider correlation with inflammatory markers and short interval follow-up MRI with contrast to ensure stability. 2. At L3-L4, mild-to-moderate canal stenosis. 3. At L4-L5, right eccentric degenerative change with mild-to-moderate right subarticular recess and foraminal stenosis. 4. At L5-S1, left eccentric degenerative change with mild-to-moderate left foraminal stenosis and mild left subarticular recess narrowing. 5. Partially imaged dilated gallbladder with borderline dilated common bile duct. This could be physiologic; however, consider correlation with liver function enzymes to exclude obstruction. Right upper quadrant ultrasound could also further evaluate if clinically indicated. Electronically Signed    By: Feliberto Harts M.D.   On: 12/11/2020 12:29   NM Pulmonary Perfusion  Result Date: 12/09/2020 CLINICAL DATA:  Pulmonary embolism suspected. Low intermediate probability. Elevated D-dimer levels. Negative lower extremity Doppler ultrasound for DVT. EXAM: NUCLEAR MEDICINE PERFUSION LUNG SCAN TECHNIQUE: Perfusion images were obtained in multiple projections after intravenous injection of radiopharmaceutical. Ventilation scans intentionally deferred if perfusion scan and chest x-ray adequate for interpretation during COVID 19 epidemic. RADIOPHARMACEUTICALS:  4.52 mCi Tc-53m MAA IV COMPARISON:  Portable chest 12/09/2020. Lower extremity Doppler ultrasound 12/09/2020. FINDINGS: There are no segmental perfusion defects to suggest pulmonary embolism. The heart is enlarged. There is mildly decreased perfusion to the left lung base, corresponding with a probable small left pleural effusion or pleural thickening on radiography. IMPRESSION: No evidence of pulmonary embolism. Electronically Signed   By: Carey Bullocks M.D.   On: 12/09/2020 12:48   US RENAL  Result Date: 12/09/2020 CLINICAL DATA:  Acute kidney injury EXAM: RENAL / URINARY TRACT ULTRASOUND COMPLETE COMPARISON:  None. FINDINGS: Right Kidney: Renal measurements: 10.4 x 5.0 x 5.4 cm = volume: 147.18 mL. Echogenicity within normal limits. No mass or hydronephrosis visualized. Left Kidney: Renal measurements: 11.5 x 5.3 x 3.9 cm = volume: 123.9 mL. Upper pole cyst measures 1.8 x 1.2 x 1.3 cm. Inferior pole cyst measures 1.5 x 1.1 x 1.3 cm Echogenicity within normal limits. No mass or hydronephrosis. Bladder: Appears normal for degree of bladder distention. Other: None. IMPRESSION: 1. Echogenicity within normal limits. No mass or hydronephrosis visualized. 2. Left kidney cysts. Electronically Signed   By: Signa Kell M.D.   On: 12/09/2020 09:31   US Carotid Bilateral  Result Date: 12/15/2020 CLINICAL DATA:  75 year old female with a history of  stroke EXAM: BILATERAL CAROTID DUPLEX ULTRASOUND TECHNIQUE: Wallace Cullens scale imaging, color Doppler and duplex ultrasound were performed of bilateral carotid and vertebral arteries in the neck. COMPARISON:  None. FINDINGS: Criteria: Quantification of carotid stenosis is based on velocity parameters that correlate the residual internal carotid diameter with NASCET-based stenosis levels, using the diameter of the distal internal carotid lumen as the denominator for stenosis measurement. The following velocity measurements were obtained: RIGHT ICA:  Systolic 86 cm/sec, Diastolic 17 cm/sec CCA:  82 cm/sec SYSTOLIC  ICA/CCA RATIO:  1.1 ECA:  74 cm/sec LEFT ICA:  Systolic 83 cm/sec, Diastolic 25 cm/sec CCA:  103 cm/sec SYSTOLIC ICA/CCA RATIO:  0.8 ECA:  69 cm/sec Right Brachial SBP: Not acquired Left Brachial SBP: Not acquired RIGHT CAROTID ARTERY: No significant calcifications of the right common carotid artery. Intermediate waveform maintained. Moderate heterogeneous and partially calcified plaque at the right carotid bifurcation. No significant lumen shadowing. Low resistance waveform of the right ICA. No significant tortuosity. RIGHT VERTEBRAL ARTERY: Antegrade flow with low resistance waveform. LEFT CAROTID ARTERY: No significant calcifications of the left common carotid artery. Intermediate waveform maintained. Moderate heterogeneous and partially calcified plaque at the left carotid bifurcation. No significant lumen shadowing. Low resistance waveform of the left ICA. No significant tortuosity. LEFT VERTEBRAL ARTERY:  Antegrade flow with low resistance waveform. IMPRESSION: Color duplex indicates moderate heterogeneous and calcified plaque, with no hemodynamically significant stenosis by duplex criteria in the extracranial cerebrovascular circulation. Signed, Yvone Neu. Reyne Dumas, RPVI [facility] Radiology Electronically Signed   By: Gilmer Mor D.O.   On: 12/15/2020 16:15    US Venous Img Lower Bilateral (DVT)  Result Date: 12/09/2020 CLINICAL DATA:  Elevated D-dimer. Shortness of breath. Evaluate for DVT. EXAM: BILATERAL LOWER EXTREMITY VENOUS DOPPLER ULTRASOUND TECHNIQUE: Gray-scale sonography with graded compression, as well as color Doppler and duplex ultrasound were performed to evaluate the lower extremity deep venous systems from the level of the common femoral vein and including the common femoral, femoral, profunda femoral, popliteal and calf veins including the posterior tibial, peroneal and gastrocnemius veins when visible. The superficial great saphenous vein was also interrogated. Spectral Doppler was utilized to evaluate flow at rest and with distal augmentation maneuvers in the common femoral, femoral and popliteal veins. COMPARISON:  None. FINDINGS: RIGHT LOWER EXTREMITY Common Femoral Vein: No evidence of thrombus. Normal compressibility, respiratory phasicity and response to augmentation. Saphenofemoral Junction: No evidence of thrombus. Normal compressibility and flow on color Doppler imaging. Profunda Femoral Vein: No evidence of thrombus. Normal compressibility and flow on color Doppler imaging. Femoral Vein: No evidence of thrombus. Normal compressibility, respiratory phasicity and response to augmentation. Popliteal Vein: No evidence of thrombus. Normal compressibility, respiratory phasicity and response to augmentation. Calf Veins: No evidence of thrombus. Normal compressibility and flow on color Doppler imaging. Superficial Great Saphenous Vein: No evidence of thrombus. Normal compressibility. Venous Reflux:  None. Other Findings:  None. LEFT LOWER EXTREMITY Common Femoral Vein: No evidence of thrombus. Normal compressibility, respiratory phasicity and response to augmentation. Saphenofemoral Junction: No evidence of thrombus. Normal compressibility and flow on color Doppler imaging. Profunda Femoral Vein: No evidence of thrombus. Normal compressibility  and flow on color Doppler imaging. Femoral Vein: No evidence of thrombus. Normal compressibility, respiratory phasicity and response to augmentation. Popliteal Vein: No evidence of thrombus. Normal compressibility, respiratory phasicity and response to augmentation. Calf Veins: No evidence of thrombus. Normal compressibility and flow on color Doppler imaging. Superficial Great Saphenous Vein: No evidence of thrombus. Normal compressibility. Venous Reflux:  None. Other Findings: There is an approximately 3.0 x 1.5 x 1.4 cm fluid collection with left popliteal fossa compatible with a Baker's cyst. IMPRESSION: 1. No evidence of DVT within either lower extremity. 2. Note made of an approximately 3.0 cm left-sided Baker's cyst. Electronically Signed   By: Simonne Come M.D.   On: 12/09/2020 11:25   DG Chest Port 1 View  Result Date: 12/13/2020 CLINICAL DATA:  PICC line EXAM: PORTABLE CHEST 1 VIEW COMPARISON:  December 13, 2020 FINDINGS: The cardiomediastinal silhouette is unchanged enlarged in contour.No PICC line is visualized as per order requisition. There is a RIGHT chest CVC with tip terminating over the RIGHT atrium. Small LEFT pleural effusion. No pneumothorax. No acute pleuroparenchymal abnormality. Visualized abdomen is unremarkable. Degenerative changes of the RIGHT shoulder. IMPRESSION: No PICC line is visualized as per order requisition. There is a RIGHT chest CVC with tip terminating over the RIGHT atrium. Electronically Signed   By: Meda Klinefelter M.D.   On: 12/13/2020 16:41   DG Chest Port 1 View  Result Date: 12/13/2020 CLINICAL DATA:  75 year old female with hypoxia. EXAM: PORTABLE CHEST 1 VIEW COMPARISON:  Portable chest 11/21/2020 and earlier. FINDINGS: Portable AP upright view at 0806 hours. Progressive left lung base opacification, obscuring the left hemidiaphragm. Stable cardiac size and mediastinal contours. Right lung appears stable. And pulmonary vascularity has not significantly  changed since 12/09/2020. No overt edema. No acute osseous abnormality identified. Paucity of bowel gas in the upper abdomen. Chronic degeneration and postoperative changes to the right shoulder. IMPRESSION: Confluent left lung base opacification since 12/09/2020. Consider left lower lobe pneumonia and/or increasing pleural effusion. Electronically Signed   By: Odessa Fleming M.D.   On: 12/13/2020 09:13   DG Chest Port 1 View  Result Date: 12/11/2020 CLINICAL DATA:  75 year old female with history of acute respiratory failure with hypoxemia. EXAM: PORTABLE CHEST 1 VIEW COMPARISON:  Chest x-ray 12/09/2020. FINDINGS: Lung volumes are normal. No consolidative airspace disease. Linear opacities of the left lung base, similar to the prior study, favored to reflect subsegmental atelectasis or scarring. Possible small left pleural effusion. No right pleural effusion. No pneumothorax. No evidence of pulmonary edema. Mild cardiomegaly. Aortic atherosclerosis. IMPRESSION: 1. Possible small left pleural effusion with subsegmental atelectasis in the left lower lobe. 2. Cardiomegaly. 3. Aortic atherosclerosis. Electronically Signed   By: Trudie Reed M.D.   On: 12/11/2020 07:52   DG Chest Port 1 View  Result Date: 12/09/2020 CLINICAL DATA:  75 year old female with possible sepsis. EXAM: PORTABLE CHEST 1 VIEW COMPARISON:  Chest radiographs 07/06/2012 and earlier. FINDINGS: Portable AP semi upright view at 0439 hours. Lower lung volumes. Possible gastric hiatal hernia which was not apparent in 2014. Otherwise stable cardiomegaly and mediastinal contours. Visualized tracheal air column is within normal limits. No pneumothorax or pulmonary edema. No consolidation or air bronchograms identified, but new blunting of the left lateral lung base is suggestive of pleural effusion. Paucity of bowel gas in the upper abdomen. Chronic degenerative and postoperative changes about the right shoulder. No acute osseous abnormality  identified. IMPRESSION: 1. Evidence of a small left pleural effusion, but no other acute cardiopulmonary abnormality identified. 2. Suggestion of hiatal hernia, new since 2014. 3. Chronic cardiomegaly. Electronically Signed   By: Odessa Fleming M.D.   On: 12/09/2020 05:35   DG Abd Portable 1V  Result Date: 12/14/2020 CLINICAL DATA:  Abdomen pain EXAM: PORTABLE ABDOMEN - 1 VIEW COMPARISON:  None. FINDINGS: Moderate diffuse gaseous distension of the colon measuring up to 8 cm. Frothy appearance in the right colon is presumably due to stool. No definite dilated central small bowel. IMPRESSION: Moderate diffuse gaseous dilatation of the bowel, possible colon ileus though if distal obstruction is a concern, further evaluation with CT should be considered Electronically Signed   By: Jasmine Pang M.D.   On: 12/14/2020 20:58   EEG adult  Result Date: 12/14/2020 Jefferson Fuel, MD     12/14/2020  3:49 PM Routine EEG Report  Jamie Benitez is a 75 y.o. female with a history of seizure who is undergoing an EEG to evaluate for seizures. Report: This EEG was acquired with electrodes placed according to the International 10-20 electrode system (including Fp1, Fp2, F3, F4, C3, C4, P3, P4, O1, O2, T3, T4, T5, T6, A1, A2, Fz, Cz, Pz). The following electrodes were missing or displaced: none. The occipital dominant rhythm was 6 Hz. This activity is reactive to stimulation. Drowsiness was manifested by background fragmentation; deeper stages of sleep were not identified. There was no focal slowing. There were no interictal epileptiform discharges. There were no electrographic seizures identified. There was no abnormal response to photic stimulation. Hyperventilation was not performed. Impression & clinical correlation: This EEG was obtained while awake and drowsy and is abnormal due to moderate diffuse slowing indicative of global cerebral dysfunction. There were no seizures or other epileptiform abnormalities seen during this  recording. Bing Neighbors, MD [facility] [phone] If 7pm- 7am, please page neurology on call as listed in AMION.   ECHOCARDIOGRAM COMPLETE  Result Date: 12/11/2020    ECHOCARDIOGRAM REPORT   Patient Name:   Jamie Benitez Date of Exam: 12/11/2020 Medical Rec #:  [id number]       Height:       60.0 in Accession #:    [id number]      Weight:       225.0 lb Date of Birth:  Jan 16, 1946        BSA:          1.962 m Patient Age:    75 years        BP:           97/73 mmHg Patient Gender: F               HR:           102 bpm. Exam Location:  [facility] Procedure: 2D Echo, Cardiac Doppler and Color Doppler Indications:     Bacteremia R78.81  History:         Patient has no prior history of Echocardiogram examinations.                  CHF, Previous Myocardial Infarction; Risk Factors:Hypertension                  and Diabetes.  Sonographer:     Cristela Blue Referring Phys:  [id number] Lynn Ito Diagnosing Phys: Lorine Bears MD  Sonographer Comments: Suboptimal apical window. IMPRESSIONS  1. Left ventricular ejection fraction, by estimation, is 25 to 30%. The left ventricle has severely decreased function. The left ventricle demonstrates regional wall motion abnormalities (see scoring diagram/findings for description). The left ventricular internal cavity size was mildly dilated. There is moderate left ventricular hypertrophy. Left ventricular diastolic parameters are indeterminate. There is akinesis of the left ventricular, entire inferior wall and inferolateral wall.  2. Right ventricular systolic function is normal. The right ventricular size is normal. Tricuspid regurgitation signal is inadequate for assessing PA pressure.  3. Left atrial size was moderately dilated.  4. Right atrial size was mildly dilated.  5. The mitral valve is normal in structure. Moderate mitral valve regurgitation. No evidence of mitral stenosis.  6. The aortic valve is normal in structure. Aortic valve regurgitation is not  visualized. Mild to moderate aortic valve sclerosis/calcification is present, without any evidence of aortic stenosis. Conclusion(s)/Recommendation(s): No evidence of valvular vegetations on this transthoracic echocardiogram. FINDINGS  Left Ventricle: Left ventricular ejection fraction, by  estimation, is 25 to 30%. The left ventricle has severely decreased function. The left ventricle demonstrates regional wall motion abnormalities. The left ventricular internal cavity size was mildly  dilated. There is moderate left ventricular hypertrophy. Left ventricular diastolic parameters are indeterminate. Right Ventricle: The right ventricular size is normal. No increase in right ventricular wall thickness. Right ventricular systolic function is normal. Tricuspid regurgitation signal is inadequate for assessing PA pressure. Left Atrium: Left atrial size was moderately dilated. Right Atrium: Right atrial size was mildly dilated. Pericardium: There is no evidence of pericardial effusion. Mitral Valve: The mitral valve is normal in structure. Moderate mitral valve regurgitation. No evidence of mitral valve stenosis. Tricuspid Valve: The tricuspid valve is normal in structure. Tricuspid valve regurgitation is not demonstrated. No evidence of tricuspid stenosis. Aortic Valve: The aortic valve is normal in structure. Aortic valve regurgitation is not visualized. Mild to moderate aortic valve sclerosis/calcification is present, without any evidence of aortic stenosis. Aortic valve mean gradient measures 2.5 mmHg. Aortic valve peak gradient measures 4.5 mmHg. Aortic valve area, by VTI measures 2.89 cm. Pulmonic Valve: The pulmonic valve was normal in structure. Pulmonic valve regurgitation is not visualized. No evidence of pulmonic stenosis. Aorta: The aortic root is normal in size and structure. Venous: The inferior vena cava was not well visualized. IAS/Shunts: No atrial level shunt detected by color flow Doppler.  LEFT  VENTRICLE PLAX 2D LVIDd:         5.50 cm LVIDs:         4.60 cm LV PW:         1.40 cm LV IVS:        1.30 cm LVOT diam:     2.00 cm LV SV:         38 LV SV Index:   20 LVOT Area:     3.14 cm  RIGHT VENTRICLE RV Basal diam:  3.90 cm RV S prime:     11.60 cm/s TAPSE (M-mode): 3.6 cm LEFT ATRIUM             Index       RIGHT ATRIUM           Index LA diam:        5.10 cm 2.60 cm/m  RA Area:     21.20 cm LA Vol (A2C):   57.9 ml 29.50 ml/m RA Volume:   64.50 ml  32.87 ml/m LA Vol (A4C):   70.9 ml 36.13 ml/m LA Biplane Vol: 71.0 ml 36.18 ml/m  AORTIC VALVE                   PULMONIC VALVE AV Area (Vmax):    2.82 cm    PV Vmax:        0.80 m/s AV Area (Vmean):   2.90 cm    PV Peak grad:   2.6 mmHg AV Area (VTI):     2.89 cm    RVOT Peak grad: 4 mmHg AV Vmax:           105.50 cm/s AV Vmean:          76.700 cm/s AV VTI:            0.132 m AV Peak Grad:      4.5 mmHg AV Mean Grad:      2.5 mmHg LVOT Vmax:         94.60 cm/s LVOT Vmean:        70.700 cm/s LVOT VTI:  0.122 m LVOT/AV VTI ratio: 0.92  AORTA Ao Root diam: 2.70 cm MITRAL VALVE               TRICUSPID VALVE MV Area (PHT): 4.80 cm    TR Peak grad:   9.2 mmHg MV Decel Time: 158 msec    TR Vmax:        152.00 cm/s MV E velocity: 66.80 cm/s                            SHUNTS                            Systemic VTI:  0.12 m                            Systemic Diam: 2.00 cm Lorine Bears MD Electronically signed by Lorine Bears MD Signature Date/Time: 12/11/2020/4:17:31 PM    Final       Labs: BNP (last 3 results) Recent Labs    12/09/20 0415 12/11/20 0627  BNP 398.2* 437.2*   Basic Metabolic Panel: Recent Labs  Lab 12/13/20 0730 12/14/20 0655 12/15/20 0620 12/16/20 0441 12/17/20 0700  NA 128* 131* 133* 133* 135  K 3.7 3.7 3.3* 4.1 3.4*  CL 86* 90* 92* 98 97*  CO2 19* 26 27 22 28   GLUCOSE 143* 121* 127* 118* 145*  BUN 80* 89* 66* 56* 41*  CREATININE 6.07* 6.24* 5.41* 4.39* 3.54*  CALCIUM 6.3* 6.5* 7.0* 7.2* 7.8*  MG  --    --   --   --  1.9  PHOS  --   --  4.9*  --   --    Liver Function Tests: Recent Labs  Lab 12/12/20 1158 12/15/20 1252  AST 101* 34  ALT 7 <5  ALKPHOS 110 115  BILITOT 0.7 0.9  PROT 4.8* 4.8*  ALBUMIN 2.0* 1.7*   No results for input(s): LIPASE, AMYLASE in the last 168 hours. No results for input(s): AMMONIA in the last 168 hours. CBC: Recent Labs  Lab 12/12/20 1158 12/13/20 0730 12/14/20 0655 12/15/20 0620 12/17/20 0700  WBC 16.0* 17.6* 12.4* 10.5 15.8*  NEUTROABS 14.1*  --   --   --   --   HGB 9.8* 10.0* 8.7* 8.4* 8.3*  HCT 27.1* 28.6* 23.6* 22.6* 23.0*  MCV 77.4* 79.9* 74.4* 74.8* 75.9*  PLT 173 155 185 193 232   Cardiac Enzymes: Recent Labs  Lab 12/15/20 1252  CKTOTAL 437*   BNP: Invalid input(s): POCBNP CBG: Recent Labs  Lab 12/16/20 1351 12/16/20 1559 12/16/20 2036 12/17/20 0730 12/17/20 1138  GLUCAP 78 204* 120* 152* 175*   D-Dimer No results for input(s): DDIMER in the last 72 hours. Hgb A1c No results for input(s): HGBA1C in the last 72 hours. Lipid Profile No results for input(s): CHOL, HDL, LDLCALC, TRIG, CHOLHDL, LDLDIRECT in the last 72 hours. Thyroid function studies No results for input(s): TSH, T4TOTAL, T3FREE, THYROIDAB in the last 72 hours.  Invalid input(s): FREET3 Anemia work up No results for input(s): VITAMINB12, FOLATE, FERRITIN, TIBC, IRON, RETICCTPCT in the last 72 hours. Urinalysis    Component Value Date/Time   COLORURINE AMBER (A) 12/09/2020 2124   APPEARANCEUR CLOUDY (A) 12/09/2020 2124   LABSPEC 1.027 12/09/2020 2124   PHURINE 5.0 12/09/2020 2124   GLUCOSEU NEGATIVE 12/09/2020 2124   HGBUR SMALL (A) 12/09/2020 2124  BILIRUBINUR NEGATIVE 12/09/2020 2124   KETONESUR NEGATIVE 12/09/2020 2124   PROTEINUR 100 (A) 12/09/2020 2124   NITRITE NEGATIVE 12/09/2020 2124   LEUKOCYTESUR NEGATIVE 12/09/2020 2124   Sepsis Labs Invalid input(s): PROCALCITONIN,  WBC,  LACTICIDVEN Microbiology Recent Results (from the past 240  hour(s))  Urine Culture     Status: Abnormal   Collection Time: 12/09/20  9:24 PM   Specimen: In/Out Cath Urine  Result Value Ref Range Status   Specimen Description   Final    IN/OUT CATH URINE Performed at [facility], [address] [place], [place] [address]    Special Requests   Final    NONE Performed at [facility], [address] [place], [place] [address]    Culture MULTIPLE SPECIES PRESENT, SUGGEST RECOLLECTION (A)  Final   Report Status 12/11/2020 FINAL  Final  CULTURE, BLOOD (ROUTINE X 2) w Reflex to ID Panel     Status: None   Collection Time: 12/10/20  4:29 PM   Specimen: BLOOD  Result Value Ref Range Status   Specimen Description BLOOD LEFT ANTECUBITAL  Final   Special Requests   Final    BOTTLES DRAWN AEROBIC ONLY Blood Culture adequate volume   Culture   Final    NO GROWTH 5 DAYS Performed at [facility], [address] [place], [place] [address]    Report Status 12/15/2020 FINAL  Final  CULTURE, BLOOD (ROUTINE X 2) w Reflex to ID Panel     Status: None   Collection Time: 12/10/20  5:19 PM   Specimen: BLOOD  Result Value Ref Range Status   Specimen Description BLOOD BLOOD LEFT FOREARM  Final   Special Requests   Final    BOTTLES DRAWN AEROBIC AND ANAEROBIC Blood Culture adequate volume   Culture   Final    NO GROWTH 5 DAYS Performed at [facility], [address], [place], [place] [address]    Report Status 12/15/2020 FINAL  Final  MRSA Next Gen by PCR, Nasal     Status: None   Collection Time: 12/11/20  4:29 AM   Specimen: Nasal Mucosa; Nasal Swab  Result Value Ref Range Status   MRSA by PCR Next Gen NOT DETECTED NOT DETECTED Final    Comment: (NOTE) The GeneXpert MRSA Assay (FDA approved for NASAL specimens only), is one component of a comprehensive MRSA colonization surveillance program. It is not intended to diagnose MRSA infection nor to guide or monitor treatment for MRSA infections. Test  performance is not FDA approved in patients less than 26 years old. Performed at [facility], [address] [place], [place] [address]      Total time spend on discharging this patient, including the last patient exam, discussing the hospital stay, instructions for ongoing care as it relates to all pertinent caregivers, as well as preparing the medical discharge records, prescriptions, and/or referrals as applicable, is 25 minutes.    Darlin Priestly, MD  [facility] 12/19/2020, 9:03 AM

## 2020-12-19 NOTE — Progress Notes (Signed)
Patient discharged to [facility] with EMS with all belongings.  Patient status DNR, A+Ox2, on 2 L of O2 via [place] above 90% SpO2.  IV in place, c/d/intact, flushed and saline locked.  Foley in place.  Temporary IJ taken out, no bleeding, dressing clean/dry/intact.  All care transferred.

## 2020-12-19 NOTE — Plan of Care (Signed)
  Problem: Education: Goal: Knowledge of General Education information will improve Description: Including pain rating scale, medication(s)/side effects and non-pharmacologic comfort measures 12/19/2020 1430 by Orvan Seen, RN Outcome: Completed/Met 12/19/2020 1430 by Orvan Seen, RN Outcome: Progressing   Problem: Health Behavior/Discharge Planning: Goal: Ability to manage health-related needs will improve 12/19/2020 1430 by Orvan Seen, RN Outcome: Completed/Met 12/19/2020 1430 by Orvan Seen, RN Outcome: Progressing   Problem: Clinical Measurements: Goal: Ability to maintain clinical measurements within normal limits will improve 12/19/2020 1430 by Orvan Seen, RN Outcome: Completed/Met 12/19/2020 1430 by Orvan Seen, RN Outcome: Progressing Goal: Will remain free from infection 12/19/2020 1430 by Orvan Seen, RN Outcome: Completed/Met 12/19/2020 1430 by Orvan Seen, RN Outcome: Progressing Goal: Diagnostic test results will improve 12/19/2020 1430 by Orvan Seen, RN Outcome: Completed/Met 12/19/2020 1430 by Orvan Seen, RN Outcome: Progressing Goal: Respiratory complications will improve 12/19/2020 1430 by Orvan Seen, RN Outcome: Completed/Met 12/19/2020 1430 by Orvan Seen, RN Outcome: Progressing Goal: Cardiovascular complication will be avoided 12/19/2020 1430 by Orvan Seen, RN Outcome: Completed/Met 12/19/2020 1430 by Orvan Seen, RN Outcome: Progressing   Problem: Activity: Goal: Risk for activity intolerance will decrease 12/19/2020 1430 by Orvan Seen, RN Outcome: Completed/Met 12/19/2020 1430 by Orvan Seen, RN Outcome: Progressing   Problem: Nutrition: Goal: Adequate nutrition will be maintained 12/19/2020 1430 by Orvan Seen, RN Outcome: Completed/Met 12/19/2020 1430 by Orvan Seen, RN Outcome: Progressing   Problem: Coping: Goal: Level of anxiety will decrease 12/19/2020  1430 by Orvan Seen, RN Outcome: Completed/Met 12/19/2020 1430 by Orvan Seen, RN Outcome: Progressing   Problem: Elimination: Goal: Will not experience complications related to bowel motility 12/19/2020 1430 by Orvan Seen, RN Outcome: Completed/Met 12/19/2020 1430 by Orvan Seen, RN Outcome: Progressing Goal: Will not experience complications related to urinary retention 12/19/2020 1430 by Orvan Seen, RN Outcome: Completed/Met 12/19/2020 1430 by Orvan Seen, RN Outcome: Progressing   Problem: Pain Managment: Goal: General experience of comfort will improve 12/19/2020 1430 by Orvan Seen, RN Outcome: Completed/Met 12/19/2020 1430 by Orvan Seen, RN Outcome: Progressing   Problem: Safety: Goal: Ability to remain free from injury will improve 12/19/2020 1430 by Orvan Seen, RN Outcome: Completed/Met 12/19/2020 1430 by Orvan Seen, RN Outcome: Progressing   Problem: Skin Integrity: Goal: Risk for impaired skin integrity will decrease 12/19/2020 1430 by Orvan Seen, RN Outcome: Completed/Met 12/19/2020 1430 by Orvan Seen, RN Outcome: Progressing

## 2020-12-19 NOTE — TOC Transition Note (Addendum)
Transition of Care [facility]) - CM/SW Discharge Note   Patient Details  Name: Jamie Benitez MRN: [id number] Date of Birth: 04/30/1945  Transition of Care [facility] CM/SW Contact:  Bing Quarry, RN Phone Number: 12/19/2020, 12:24 PM   Clinical Narrative: Patient was unable to transport to [facility] as planned on 12/18/20, due to [facility] Ian/weather conditions and [facility] transport issues. [facility] is till able to receive patient today per phone call to [place]. Informed unit RN. Patient will transport with IV/Foley in place. Patient currently being medicated for pain and readied for transport and will let RN CM know what time for pick up.   Notified Daughter, Marylene Land, of impending transport today by phone at [phone]. [facility] notified and scheduled to pick up around 2 pm today. Unit RN notified and transport paperwork printed to unit. DNR and pain medications signed by provider. PIV, Foley, Oxygen/[place] in use and transport notified of this. 120 pm.  Gabriel Cirri RN CM       Final next level of care: [facility] Facility Barriers to Discharge: No Barriers Identified   Patient Goals and CMS Choice Patient states their goals for this hospitalization and ongoing recovery are:: to go home CMS Medicare.gov Compare Post Acute Care list provided to:: Patient Represenative (must comment) Choice offered to / list presented to : Adult Children  Discharge Placement              Patient chooses bed at:  (hospice home) Patient to be transferred to facility by: [facility] Name of family member notified: [facility] informed Patient and family notified of of transfer: 12/18/20  Discharge Plan and Services                                     Social Determinants of Health (SDOH) Interventions     Readmission Risk Interventions No flowsheet data found.

## 2020-12-19 NOTE — Plan of Care (Signed)

## 2020-12-22 ENCOUNTER — Ambulatory Visit: Payer: Medicare Other | Admitting: Family

## 2020-12-29 LAB — BLOOD GAS, ARTERIAL
Acid-base deficit: 1.1 mmol/L (ref 0.0–2.0)
Bicarbonate: 24.8 mmol/L (ref 20.0–28.0)
O2 Saturation: 98 %
Patient temperature: 37
pCO2 arterial: 45 mmHg (ref 32.0–48.0)
pH, Arterial: 7.35 (ref 7.350–7.450)
pO2, Arterial: 109 mmHg — ABNORMAL HIGH (ref 83.0–108.0)

## 2021-01-19 DEATH — deceased
